# Patient Record
Sex: Female | Born: 1940
Health system: Southern US, Community
[De-identification: ages and names within clinical notes are randomized; demographics above are authoritative.]

## PROBLEM LIST (undated history)

## (undated) DIAGNOSIS — E7800 Pure hypercholesterolemia, unspecified: Secondary | ICD-10-CM

## (undated) DIAGNOSIS — N390 Urinary tract infection, site not specified: Secondary | ICD-10-CM

## (undated) DIAGNOSIS — K219 Gastro-esophageal reflux disease without esophagitis: Secondary | ICD-10-CM

## (undated) DIAGNOSIS — A048 Other specified bacterial intestinal infections: Secondary | ICD-10-CM

## (undated) HISTORY — PX: ABDOMINAL SURGERY: SHX537

## (undated) HISTORY — PX: ABDOMINAL HYSTERECTOMY: SHX81

---

## 1997-09-02 ENCOUNTER — Inpatient Hospital Stay (HOSPITAL_COMMUNITY): Admission: RE | Admit: 1997-09-02 | Discharge: 1997-09-04 | Payer: Self-pay | Admitting: Obstetrics and Gynecology

## 1999-02-15 ENCOUNTER — Observation Stay (HOSPITAL_COMMUNITY): Admission: EM | Admit: 1999-02-15 | Discharge: 1999-02-16 | Payer: Self-pay | Admitting: Emergency Medicine

## 1999-02-15 ENCOUNTER — Encounter: Payer: Self-pay | Admitting: *Deleted

## 1999-12-22 ENCOUNTER — Other Ambulatory Visit: Admission: RE | Admit: 1999-12-22 | Discharge: 1999-12-22 | Payer: Self-pay | Admitting: Obstetrics and Gynecology

## 2000-12-26 ENCOUNTER — Other Ambulatory Visit: Admission: RE | Admit: 2000-12-26 | Discharge: 2000-12-26 | Payer: Self-pay | Admitting: Obstetrics and Gynecology

## 2001-02-27 ENCOUNTER — Observation Stay (HOSPITAL_COMMUNITY): Admission: RE | Admit: 2001-02-27 | Discharge: 2001-02-28 | Payer: Self-pay | Admitting: Urology

## 2001-02-27 ENCOUNTER — Encounter (INDEPENDENT_AMBULATORY_CARE_PROVIDER_SITE_OTHER): Payer: Self-pay

## 2001-12-14 ENCOUNTER — Other Ambulatory Visit: Admission: RE | Admit: 2001-12-14 | Discharge: 2001-12-14 | Payer: Self-pay | Admitting: Internal Medicine

## 2002-05-24 ENCOUNTER — Encounter: Payer: Self-pay | Admitting: Obstetrics and Gynecology

## 2002-05-24 ENCOUNTER — Ambulatory Visit (HOSPITAL_COMMUNITY): Admission: RE | Admit: 2002-05-24 | Discharge: 2002-05-24 | Payer: Self-pay | Admitting: Obstetrics and Gynecology

## 2002-12-13 ENCOUNTER — Other Ambulatory Visit: Admission: RE | Admit: 2002-12-13 | Discharge: 2002-12-13 | Payer: Self-pay | Admitting: Obstetrics and Gynecology

## 2003-05-23 ENCOUNTER — Ambulatory Visit (HOSPITAL_COMMUNITY): Admission: RE | Admit: 2003-05-23 | Discharge: 2003-05-23 | Payer: Self-pay | Admitting: Obstetrics and Gynecology

## 2003-08-25 ENCOUNTER — Observation Stay (HOSPITAL_COMMUNITY): Admission: EM | Admit: 2003-08-25 | Discharge: 2003-08-26 | Payer: Self-pay

## 2004-01-22 ENCOUNTER — Other Ambulatory Visit: Admission: RE | Admit: 2004-01-22 | Discharge: 2004-01-22 | Payer: Self-pay | Admitting: Obstetrics and Gynecology

## 2005-03-25 ENCOUNTER — Other Ambulatory Visit: Admission: RE | Admit: 2005-03-25 | Discharge: 2005-03-25 | Payer: Self-pay | Admitting: Obstetrics and Gynecology

## 2005-07-08 ENCOUNTER — Ambulatory Visit: Payer: Self-pay | Admitting: Internal Medicine

## 2006-01-20 ENCOUNTER — Ambulatory Visit: Payer: Self-pay | Admitting: Internal Medicine

## 2006-05-25 ENCOUNTER — Emergency Department (HOSPITAL_COMMUNITY): Admission: EM | Admit: 2006-05-25 | Discharge: 2006-05-25 | Payer: Self-pay | Admitting: Emergency Medicine

## 2006-09-11 ENCOUNTER — Emergency Department (HOSPITAL_COMMUNITY): Admission: EM | Admit: 2006-09-11 | Discharge: 2006-09-11 | Payer: Self-pay | Admitting: Emergency Medicine

## 2006-09-27 ENCOUNTER — Inpatient Hospital Stay (HOSPITAL_COMMUNITY): Admission: EM | Admit: 2006-09-27 | Discharge: 2006-09-29 | Payer: Self-pay | Admitting: Emergency Medicine

## 2006-10-26 ENCOUNTER — Emergency Department (HOSPITAL_COMMUNITY): Admission: EM | Admit: 2006-10-26 | Discharge: 2006-10-27 | Payer: Self-pay | Admitting: Emergency Medicine

## 2007-04-20 ENCOUNTER — Ambulatory Visit (HOSPITAL_BASED_OUTPATIENT_CLINIC_OR_DEPARTMENT_OTHER): Admission: RE | Admit: 2007-04-20 | Discharge: 2007-04-20 | Payer: Self-pay | Admitting: Urology

## 2007-04-20 ENCOUNTER — Encounter (INDEPENDENT_AMBULATORY_CARE_PROVIDER_SITE_OTHER): Payer: Self-pay | Admitting: Urology

## 2007-10-10 ENCOUNTER — Emergency Department (HOSPITAL_COMMUNITY): Admission: EM | Admit: 2007-10-10 | Discharge: 2007-10-11 | Payer: Self-pay | Admitting: Emergency Medicine

## 2008-02-15 ENCOUNTER — Ambulatory Visit: Payer: Self-pay | Admitting: Internal Medicine

## 2008-02-15 DIAGNOSIS — R0602 Shortness of breath: Secondary | ICD-10-CM | POA: Insufficient documentation

## 2008-02-15 DIAGNOSIS — R079 Chest pain, unspecified: Secondary | ICD-10-CM | POA: Insufficient documentation

## 2008-02-15 DIAGNOSIS — J45909 Unspecified asthma, uncomplicated: Secondary | ICD-10-CM | POA: Insufficient documentation

## 2008-03-07 ENCOUNTER — Ambulatory Visit: Payer: Self-pay | Admitting: Cardiovascular Disease

## 2008-07-26 ENCOUNTER — Emergency Department (HOSPITAL_COMMUNITY): Admission: EM | Admit: 2008-07-26 | Discharge: 2008-07-26 | Payer: Self-pay | Admitting: Emergency Medicine

## 2009-02-13 ENCOUNTER — Emergency Department (HOSPITAL_COMMUNITY): Admission: EM | Admit: 2009-02-13 | Discharge: 2009-02-13 | Payer: Self-pay | Admitting: Family Medicine

## 2009-05-15 ENCOUNTER — Encounter (HOSPITAL_COMMUNITY): Admission: RE | Admit: 2009-05-15 | Discharge: 2009-08-13 | Payer: Self-pay | Admitting: Internal Medicine

## 2009-05-16 ENCOUNTER — Emergency Department (HOSPITAL_COMMUNITY): Admission: EM | Admit: 2009-05-16 | Discharge: 2009-05-16 | Payer: Self-pay | Admitting: Emergency Medicine

## 2009-08-19 ENCOUNTER — Emergency Department (HOSPITAL_COMMUNITY): Admission: EM | Admit: 2009-08-19 | Discharge: 2009-08-19 | Payer: Self-pay | Admitting: Emergency Medicine

## 2009-08-31 ENCOUNTER — Emergency Department (HOSPITAL_COMMUNITY): Admission: EM | Admit: 2009-08-31 | Discharge: 2009-08-31 | Payer: Self-pay | Admitting: Family Medicine

## 2010-02-19 ENCOUNTER — Emergency Department (HOSPITAL_COMMUNITY): Admission: EM | Admit: 2010-02-19 | Discharge: 2010-02-20 | Payer: Self-pay | Admitting: Emergency Medicine

## 2010-03-19 ENCOUNTER — Ambulatory Visit (HOSPITAL_BASED_OUTPATIENT_CLINIC_OR_DEPARTMENT_OTHER): Admission: RE | Admit: 2010-03-19 | Discharge: 2010-03-19 | Payer: Self-pay | Admitting: Urology

## 2010-07-26 ENCOUNTER — Encounter: Payer: Self-pay | Admitting: Internal Medicine

## 2010-08-12 ENCOUNTER — Ambulatory Visit (HOSPITAL_COMMUNITY)
Admission: RE | Admit: 2010-08-12 | Discharge: 2010-08-12 | Disposition: A | Discharge status: Home / Self Care | Payer: MEDICARE | Source: Ambulatory Visit | Attending: Chiropractic Medicine | Admitting: Chiropractic Medicine

## 2010-08-12 ENCOUNTER — Other Ambulatory Visit (HOSPITAL_COMMUNITY): Payer: Self-pay | Admitting: Chiropractic Medicine

## 2010-08-12 DIAGNOSIS — M79609 Pain in unspecified limb: Secondary | ICD-10-CM | POA: Insufficient documentation

## 2010-08-12 DIAGNOSIS — R52 Pain, unspecified: Secondary | ICD-10-CM

## 2010-09-16 LAB — URINE CULTURE
Colony Count: >=100000
Culture  Setup Time: 201108201134

## 2010-09-16 LAB — CBC
HCT: 31.7 % — ABNORMAL LOW (ref 36.0–46.0)
Hemoglobin: 10.3 g/dL — ABNORMAL LOW (ref 12.0–15.0)
MCH: 26.1 pg (ref 26.0–34.0)
MCHC: 32.5 g/dL (ref 30.0–36.0)
MCV: 80.5 fL (ref 78.0–100.0)
Platelets: 223 10*3/uL (ref 150–400)
RBC: 3.94 MIL/uL (ref 3.87–5.11)
RDW: 14.2 % (ref 11.5–15.5)
WBC: 10.7 10*3/uL — ABNORMAL HIGH (ref 4.0–10.5)

## 2010-09-16 LAB — COMPREHENSIVE METABOLIC PANEL
ALT: 18 U/L (ref 0–35)
AST: 17 U/L (ref 0–37)
Albumin: 3.3 g/dL — ABNORMAL LOW (ref 3.5–5.2)
Alkaline Phosphatase: 47 U/L (ref 39–117)
BUN: 9 mg/dL (ref 6–23)
CO2: 28 mEq/L (ref 19–32)
Calcium: 8.5 mg/dL (ref 8.4–10.5)
Chloride: 104 mEq/L (ref 96–112)
Creatinine, Ser: 0.6 mg/dL (ref 0.4–1.2)
GFR calc Af Amer: >60 mL/min (ref >60)
GFR calc non Af Amer: >60 mL/min (ref >60)
Glucose, Bld: 96 mg/dL (ref 70–99)
Potassium: 4.1 mEq/L (ref 3.5–5.1)
Sodium: 135 mEq/L (ref 135–145)
Total Bilirubin: 0.6 mg/dL (ref 0.3–1.2)
Total Protein: 6.8 g/dL (ref 6.0–8.3)

## 2010-09-16 LAB — URINALYSIS, ROUTINE W REFLEX MICROSCOPIC
Bilirubin Urine: NEGATIVE
Glucose, UA: NEGATIVE mg/dL
Ketones, ur: NEGATIVE mg/dL
Nitrite: NEGATIVE
Protein, ur: NEGATIVE mg/dL
Specific Gravity, Urine: 1.004 — ABNORMAL LOW (ref 1.005–1.030)
Urobilinogen, UA: 0.2 mg/dL (ref 0.0–1.0)
pH: 7.5 (ref 5.0–8.0)

## 2010-09-16 LAB — URINE MICROSCOPIC-ADD ON

## 2010-09-16 LAB — DIFFERENTIAL
Basophils Absolute: 0.1 10*3/uL (ref 0.0–0.1)
Basophils Relative: 1 % (ref 0–1)
Eosinophils Absolute: 1 10*3/uL — ABNORMAL HIGH (ref 0.0–0.7)
Eosinophils Relative: 9 % — ABNORMAL HIGH (ref 0–5)
Lymphocytes Relative: 26 % (ref 12–46)
Lymphs Abs: 2.7 10*3/uL (ref 0.7–4.0)
Monocytes Absolute: 0.6 10*3/uL (ref 0.1–1.0)
Monocytes Relative: 6 % (ref 3–12)
Neutro Abs: 6.2 10*3/uL (ref 1.7–7.7)
Neutrophils Relative %: 58 % (ref 43–77)

## 2010-09-16 LAB — POCT HEMOGLOBIN-HEMACUE: Hemoglobin: 11.3 g/dL — ABNORMAL LOW (ref 12.0–15.0)

## 2010-09-22 LAB — URINALYSIS, ROUTINE W REFLEX MICROSCOPIC
Bilirubin Urine: NEGATIVE
Glucose, UA: NEGATIVE mg/dL
Ketones, ur: NEGATIVE mg/dL
Nitrite: NEGATIVE
Protein, ur: 100 mg/dL — AB
Specific Gravity, Urine: 1.017 (ref 1.005–1.030)
Urobilinogen, UA: 0.2 mg/dL (ref 0.0–1.0)
pH: 6 (ref 5.0–8.0)

## 2010-09-22 LAB — URINE CULTURE
Colony Count: NO GROWTH
Culture: NO GROWTH

## 2010-09-22 LAB — POCT I-STAT, CHEM 8
BUN: 10 mg/dL (ref 6–23)
Calcium, Ion: 1.06 mmol/L — ABNORMAL LOW (ref 1.12–1.32)
Chloride: 96 mEq/L (ref 96–112)
Creatinine, Ser: 0.5 mg/dL (ref 0.4–1.2)
Glucose, Bld: 83 mg/dL (ref 70–99)
HCT: 37 % (ref 36.0–46.0)
Hemoglobin: 12.6 g/dL (ref 12.0–15.0)
Potassium: 4.1 mEq/L (ref 3.5–5.1)
Sodium: 131 mEq/L — ABNORMAL LOW (ref 135–145)
TCO2: 32 mmol/L (ref 0–100)

## 2010-09-22 LAB — PREGNANCY, URINE: Preg Test, Ur: NEGATIVE

## 2010-09-22 LAB — POCT URINALYSIS DIP (DEVICE)
Bilirubin Urine: NEGATIVE
Glucose, UA: NEGATIVE mg/dL
Ketones, ur: NEGATIVE mg/dL
Nitrite: NEGATIVE
Protein, ur: 100 mg/dL — AB
Specific Gravity, Urine: 1.01 (ref 1.005–1.030)
Urobilinogen, UA: 0.2 mg/dL (ref 0.0–1.0)
pH: 7 (ref 5.0–8.0)

## 2010-09-22 LAB — URINE MICROSCOPIC-ADD ON

## 2010-10-06 LAB — POCT CARDIAC MARKERS
CKMB, poc: <1 ng/mL — ABNORMAL LOW (ref 1.0–8.0)
Myoglobin, poc: 46.1 ng/mL (ref 12–200)
Troponin i, poc: <0.05 ng/mL (ref 0.00–0.09)

## 2010-10-06 LAB — DIFFERENTIAL
Basophils Relative: 0 % (ref 0–1)
Lymphocytes Relative: 5 % — ABNORMAL LOW (ref 12–46)
Monocytes Absolute: 0.1 10*3/uL (ref 0.1–1.0)
Monocytes Relative: 1 % — ABNORMAL LOW (ref 3–12)
Neutro Abs: 9.6 10*3/uL — ABNORMAL HIGH (ref 1.7–7.7)
Neutrophils Relative %: 90 % — ABNORMAL HIGH (ref 43–77)

## 2010-10-06 LAB — CBC
Hemoglobin: 11.3 g/dL — ABNORMAL LOW (ref 12.0–15.0)
MCHC: 33.8 g/dL (ref 30.0–36.0)
RBC: 4.08 MIL/uL (ref 3.87–5.11)
WBC: 10.7 10*3/uL — ABNORMAL HIGH (ref 4.0–10.5)

## 2010-10-06 LAB — POCT I-STAT, CHEM 8
Chloride: 98 mEq/L (ref 96–112)
Creatinine, Ser: 0.5 mg/dL (ref 0.4–1.2)
HCT: 36 % (ref 36.0–46.0)
Hemoglobin: 12.2 g/dL (ref 12.0–15.0)
Potassium: 3.4 mEq/L — ABNORMAL LOW (ref 3.5–5.1)
Sodium: 136 mEq/L (ref 135–145)

## 2010-10-18 LAB — POCT I-STAT, CHEM 8
Creatinine, Ser: 0.5 mg/dL (ref 0.4–1.2)
Glucose, Bld: 133 mg/dL — ABNORMAL HIGH (ref 70–99)
HCT: 40 % (ref 36.0–46.0)
Hemoglobin: 13.6 g/dL (ref 12.0–15.0)
Sodium: 139 mEq/L (ref 135–145)
TCO2: 29 mmol/L (ref 0–100)

## 2010-11-04 ENCOUNTER — Other Ambulatory Visit: Payer: Self-pay | Admitting: Orthopedic Surgery

## 2010-11-04 DIAGNOSIS — M542 Cervicalgia: Secondary | ICD-10-CM

## 2010-11-08 ENCOUNTER — Ambulatory Visit
Admission: RE | Admit: 2010-11-08 | Discharge: 2010-11-08 | Disposition: A | Discharge status: Home / Self Care | Payer: MEDICARE | Source: Ambulatory Visit | Attending: Orthopedic Surgery | Admitting: Orthopedic Surgery

## 2010-11-08 DIAGNOSIS — M542 Cervicalgia: Secondary | ICD-10-CM

## 2010-11-16 NOTE — Op Note (Signed)
Samantha [name], Samantha [name]                    ACCOUNT NO.:  [id number]   MEDICAL RECORD NO.:  [id number]          PATIENT TYPE:  AMB   LOCATION:  NESC                         FACILITY:  [facility]   PHYSICIAN:  Sigmund I. Patsi Sears, M.D.DATE OF BIRTH:  1941-07-04   DATE OF PROCEDURE:  04/20/2007  DATE OF DISCHARGE:  04/20/2007                               OPERATIVE REPORT   PREOPERATIVE DIAGNOSIS:  Interstitial cystitis.   POSTOPERATIVE DIAGNOSIS:  Interstitial cystitis.   OPERATION:  Cystourethroscopy, hydrodistention of the bladder, bladder  biopsy, insertion of Marcaine/Pyridium in the bladder, injection of  Marcaine/Kenalog into the subtrigonal space.   SURGEON:  Sigmund I. Patsi Sears, M.D.   ANESTHESIA:  General, LMA.   PREPARATION:  After appropriate preanesthesia the patient was brought to  the operating room and placed on the operating table in dorsal supine  position where general LMA anesthesia was induced.  She was then  replaced in the dorsal lithotomy position where the pubis was prepped  with Betadine solution and draped in the usual fashion.   REVIEWED HISTORY:  This 70 year old [place] female has been  evaluated, complaining of dyspareunia and recurrent urinary tract  infections.  She was originally seen in 2002 by Dr. Annabell Howells, with a  history of cysto, HOD for IC at that time.  The patient's bladder  distended to 1200 mL and urethra was dilated to 34 [place].  Mild  glomerular lesions were identified in the bladder wall at that time.  She is now for repeat HOD.   PROCEDURE IN DETAIL:  Cystourethroscopy was accomplished, it shows a  normal appearing bladder which holds approximately 900 mL.  Following  this, repeat cystoscopy was accomplished and mild glomerulations were  noted.  Cold cup bladder biopsies were taken and each biopsy site is  then cauterized.  Marcaine/Pyridium is inserted in the bladder, and  Marcaine/Kenalog is injected into the bladder base.  B and O  suppository  was given at the beginning of the procedure.  The patient was awakened  and taken to the recovery room in good condition.      Sigmund I. Patsi Sears, M.D.  Electronically Signed     SIT/MEDQ  D:  04/20/2007  T:  04/22/2007  Job:  [id number]

## 2010-11-19 NOTE — Op Note (Signed)
[facility]  Patient:    HERMENA, SWINT Visit Number: [id number] MRN: [id number]          Service Type: SUR Location: 4W [id number] [id number] Attending Physician:  Evlyn Clines Proc. Date: 02/27/01 Adm. Date:  02/27/2001                             Operative Report  INCOMPLETE DICTATION  PROCEDURE:  Cystoscopy, hydrodistention of bladder, urethral dilation, installation of Pyridium and Marcaine.  PREOPERATIVE DIAGNOSES:  Painful bladder.  POSTOPERATIVE DIAGNOSES:  Painful bladder.  SURGEON:  Excell Seltzer. Annabell Howells, M.D.  ANESTHESIA:  General.  COMPLICATIONS:  None.  INDICATIONS FOR PROCEDURE:  Ms. Dobesh is a 70 year old ______ female who was sent by Dr. Mancel Bale for irritative voiding symptoms, question it sounds like she might have ... Attending Physician:  Evlyn Clines DD:  02/27/01 TD:  02/27/01 Job: [id number]

## 2010-11-19 NOTE — H&P (Signed)
[facility]  Patient:    Samantha [name], Samantha [name] Visit Number: [id number] MRN: [id number]          Service Type: Attending:  Duke Salvia. Marcelle Overlie, M.D. Dictated by:   Duke Salvia. Marcelle Overlie, M.D. Adm. Date:  02/27/01                           History and Physical  CHIEF COMPLAINT:  Chronic pelvic pain.  HISTORY OF PRESENT ILLNESS:  The patient is a 70 year old Asian female who underwent TBH in 1999 with conservation of her ovaries at the time, no ovarian abnormalities were noted at that time.  Since then, she has been bothered by back and deep pelvic pain with negative ultrasound examinations, but due to continued problems with pain, now presents for a laparoscopy with definitive bilateral salpingo-oophorectomy.  This procedure was explained to her through her interpretor.  The procedure of laparoscopy with laparoscopic removal of the ovaries, the possible need for open or additional surgery discussed. Risks relative to bleeding, infection, transfusion, adjacent organ injury explained also.  In the meantime, she has had evaluation for some bladder symptoms by Dr. Bjorn Pippin, who will perform cystoscopy and hydrodistention of the bladder with urethral dilation, Peridium, and Marcaine for possible ______.  This will be done at the same time.  ALLERGIES:  No known drug allergies.  PAST SURGICAL HISTORY:  TBH.  OBSTETRICAL HISTORY:  Two vaginal deliveries at term.  CURRENT MEDICATIONS:  ______ 1 mg q.d.  PHYSICAL EXAMINATION:  VITAL SIGNS:  Temperature 98.2, blood pressure 120/70.  HEENT:  Unremarkable.  NECK:  Supple without masses.  LUNGS:  Clear.  CARDIOVASCULAR:  Regular rate and rhythm without murmurs, rubs, or gallops.  BREASTS:  Without masses.  ABDOMEN:  Soft, flat, nontender.  PELVIC:  Normal external genitalia.  Vaginal cuff clear.  Bimanual revealed tenderness, but no masses or nodularity noted.  EXTREMITIES:  Unremarkable.  NEUROLOGIC:   Unremarkable.  IMPRESSION: 1. Chronic pelvic pain, possible ovarian adhesions. 2. Chronic bladder symptoms.  PLAN:  Cystoscopy per Dr. Annabell Howells, followed by diagnostic laparoscopy with laparoscopic or open bilateral salpingo-oophorectomy.  Procedure and risks discussed as above. Dictated by:   Duke Salvia. Marcelle Overlie, M.D. Attending:  Duke Salvia. Marcelle Overlie, M.D. DD:  02/25/01 TD:  02/26/01 Job: [id number]

## 2010-11-19 NOTE — Op Note (Signed)
Samantha [name], Samantha [name] NO.:  [id number]   MEDICAL RECORD NO.:  [id number]                   PATIENT TYPE:  INP   LOCATION:  5714                                 FACILITY:  [facility]   PHYSICIAN:  Artist Pais. Mina Marble, M.D.           DATE OF BIRTH:  [birth year]   DATE OF PROCEDURE:  08/25/2003  DATE OF DISCHARGE:  08/26/2003                                 OPERATIVE REPORT   PREOPERATIVE DIAGNOSES:  Left hand crush injury with degloving injury, with  PIP dislocation left 5th digit.  Possible tendon, artery and nerve damage.   POSTOPERATIVE DIAGNOSES:  Left hand crush injury with degloving injury, with  proximal interphalangeal dislocation left 5th digit.  Possible tendon,  artery and  nerve damage.   PROCEDURE:  Irrigation and debridement, microscopic repair of the common  digital nerve to index and long, and closed treatment of the proximal  interphalangeal dislocation.   SURGEON:  Artist Pais. Mina Marble, M.D.   ASSISTANT:  None.   ANESTHESIA:  General.   TOURNIQUET TIME:  45 minutes.   COMPLICATIONS:  None.   DESCRIPTION OF PROCEDURE:  The patient was taken to the operating room.  After the induction of adequate general anesthesia, the left upper extremity  was prepped and draped in the usual sterile fashion.  Esmarch was used to  exsanguinate the limb. Tourniquet was inflated to 250 mmHg.  At this point  in time, the left hand was explored. There was a complex laceration palmarly  from the MP joint of the index finger to the MP joint of the small finger,  volarly, in an oblique fashion.   Exploration revealed an avulsion type injury to the common digital nerve to  the index and long.  This was repaired under microscopic magnification using  9-0 nylon.  After this was done, a closed PIP dislocation on the 5th digit  was reduced.  The wounds were thoroughly irrigated. They were loosely closed  with 5-0 nylon.  Patient was placed in a sterile dressing,  Xeroform, 4 x  4's, Fluffs and a protective splint.  The patient tolerated the procedure  well, went to the recovery room in a stable fashion.                                               Artist Pais Mina Marble, M.D.    MAW/MEDQ  D:  10/09/2003  T:  10/09/2003  Job:  [id number]

## 2010-11-19 NOTE — Assessment & Plan Note (Signed)
[facility]                           ELECTROPHYSIOLOGY OFFICE NOTE   NAME:Happel, H-DEN                            MRN:          [id number]  DATE:01/20/2006                            DOB:          1940/12/24    Ms. Rippetoe returns today for followup.  Her history is provided by her son who  is fluent in [place].  She is a pleasant 70 year old [place] woman with a  history of palpitations and documented SVT which I do not have records of  today but my prior notes suggested that she had a long RP tachycardia.  This  was at rate of approximately 150 beats per minute.  She also on cardiac  monitoring had fairly frequent PACs and PVCs.  She returns today for  followup and states that she has had rare heart racing since being started  on beta-blockers but does note some fatigue and tiredness on these drugs.  She also complains of leg pain which proceeds her initiation of Crestor.  She denies shortness of breath and otherwise feels well except for fatigue  on her beta-blocker.  She has had no syncope.   PHYSICAL EXAMINATION:  GENERAL:  On exam, she is a pleasant middle-aged  woman in no distress.  VITAL SIGNS:  Blood pressure was 104/68, the pulse 51 and regular,  respirations are 18.  The weight was 129 pounds.  NECK:  Revealed no jugular venous distention.  LUNGS:  Clear bilaterally to auscultation.  CARDIOVASCULAR:  Revealed a regular rate and rhythm with normal S1 and S2.  EXTREMITIES:  Demonstrated no edema.   EKG today demonstrates a sinus bradycardia at 51 beats per minute.  There  are no ST-T wave abnormalities.   IMPRESSION:  1.  Symptomatic palpitations.  2.  Documented nonsustained supraventricular tachycardia.  3.  Frequent premature ventricular contractions.  4.  Sinus bradycardia.   DISCUSSION:  Ms. Nickle is stable at present.  While she does continue to have  some arrhythmia, it is not particularly severe.  Today, because of her  fatigue  on a twice daily beta-blocker, I have asked her to take 1 tablet at  night and none during the day unless she has breakthrough  palpitations and SVT.  We will plan to see her back in a year, sooner should  she have symptomatic recurrence.                                  Doylene Canning. Ladona Ridgel, MD  GWT/MedQ  DD:  01/20/2006  DT:  01/20/2006  Job #:  [id number]   cc:   Cristy Hilts. Jacinto Halim, MD

## 2010-11-19 NOTE — Consult Note (Signed)
[name], [name] NO.:  [id number]   MEDICAL RECORD NO.:  [id number]                   PATIENT TYPE:  INP   LOCATION:  5714                                 FACILITY:  [facility]   PHYSICIAN:  Artist Pais. Mina Marble, M.D.           DATE OF BIRTH:  [birth year]   DATE OF CONSULTATION:  08/25/2003  DATE OF DISCHARGE:                                   CONSULTATION   REQUESTING PHYSICIAN:  Pearletha Alfred, M.D.   REASON FOR CONSULTATION:  Samantha [name] is a very pleasant right hand dominant  female who injured her left hand while at work today at __________ in  [place], [place], and was transferred here with an acute injury to  her nondominant left hand. She is an otherwise fairly healthy 70 year old  female, right hand dominant, with no known drug allergies. She is currently  taking Nexium. She had a hysterectomy in the year 2000. No recent  hospitalizations or surgery.   FAMILY HISTORY:  Noncontributory.   SOCIAL HISTORY:  Noncontributory.   PHYSICAL EXAMINATION:  Examination reveals an obvious deformity of the fifth  digit with what appears to be a dislocation of the fifth PIP joint. She also  has complex lacerations across the distal palmar crease and mid palmar  crease with loss of sensation to the long and ring finger distally and  weakness in flexion. X-rays show PIP dislocation on the fifth digit.   IMPRESSION:  A 70 year old female with an open injury to her hand with PIP  dislocation and possible tendon, artery, and nerve damage with complex  lacerations palmarly.   RECOMMENDATIONS:  Recommendation at this point in time is to take her to the  operating room for exploration and repair as necessary of traumatic injury.                                               Artist Pais Mina Marble, M.D.    MAW/MEDQ  D:  08/25/2003  T:  08/26/2003  Job:  [id number]

## 2010-11-19 NOTE — Op Note (Signed)
[facility]  Patient:    Samantha [name], Samantha [name] Visit Number: [id number] MRN: [id number]          Service Type: Attending:  Excell Seltzer. Annabell Howells, M.D. Dictated by:   Excell Seltzer. Annabell Howells, M.D. Proc. Date: 02/27/01                             Operative Report  OPERATION:  Cystoscopy, hydrodistention of the bladder, urethral dilation and instillation of Pyridium and Marcaine.  PREOPERATIVE DIAGNOSIS:  Painful bladder.  POSTOPERATIVE DIAGNOSIS:  Painful bladder.  SURGEON:  Excell Seltzer. Annabell Howells, M.D.  ANESTHESIA:  General  COMPLICATIONS:  None.  INDICATIONS:  Mrs. Burnstein is a 70 year old female who was sent to see me by Dr. Marcelle Overlie for frequency and bladder discomfort.  Her symptoms were suggestive of interstitial cystitis. It was felt that cystoscopy and hydrodistention of the bladder were indicated.  She is to undergo laparoscopy by Dr. Marcelle Overlie to rule out pelvic scarring and problems of the left ovary.  FINDINGS AND PROCEDURE: She was given p.o. Tequin and taken to the operating room where general anesthetic was induced.  She was placed in the lithotomy position.  Perineum and genitalia were prepped Betadine solution.  She was draped in the usual sterile fashion.  Cystoscopy was performed using a 22 [place] scope and the 12 and 70 degrees lens.  This examination revealed a normal urethra.  The bladder wall had mild trabeculation.  No tumors, stones or inflammation were noted.  Ureteral orifices were in their normal anatomic position, effluxing clear urine.  The cystoscope was removed and an 58 French Foley catheter was inserted.  The bladder was then filled under 80 to 100 cm of water pressure to capacity. This was held for five minutes.  The bladder was then drained.  Her capacity under anesthesia was 1200 cc.  There was no blood in the terminal efflux.  Reinspection of the bladder after hydrodistention revealed mild evidence of glomerulations on the bladder walls but no  significant cracking or bleeding.  The urethra was then calibrated with female sounds up to 35 [place].  She was a bit tight at 30. She then underwent replacement of the Foley catheter.  The bladder was drained. The bladder was then instilled with 30 cc of 0.25% Marcaine with 400 cc of crushed Pyridium and the catheter was then removed.  At this point, Dr. Marcelle Overlie took over to perform the laparoscopy. There were no complications during my portion of the procedure. Dictated by:   Excell Seltzer. Annabell Howells, M.D. Attending:  Excell Seltzer. Annabell Howells, M.D. DD:  02/27/01 TD:  02/27/01 Job: [id number]

## 2010-11-19 NOTE — Cardiovascular Report (Signed)
Samantha [name], Samantha [name]                    ACCOUNT NO.:  [id number]   MEDICAL RECORD NO.:  [id number]          PATIENT TYPE:  INP   LOCATION:  6527                         FACILITY:  [facility]   PHYSICIAN:  Darlin Priestly, MD  DATE OF BIRTH:  Oct 30, 1940   DATE OF PROCEDURE:  09/27/2006  DATE OF DISCHARGE:                            CARDIAC CATHETERIZATION   PROCEDURE:  1. Left heart catheterization.  2. Coronary angiography.  3. Left ventriculogram.   COMPLICATIONS:  None.   INDICATIONS FOR PROCEDURE:  This patient is a 70 year old female who is  a patient of Dr. Yates Decamp with a history of intermittent palpitations  and SVT.  She was admitted to the ER on the evening of September 26, 2006,  with exceptional chest pain without significant EKG changes.  She  subsequently ruled out for MI.  She is now brought for a cardiac  catheterization to rule out significant  CAD.   DESCRIPTION OF PROCEDURE:  After obtaining informed consent, the patient  was brought to the cardiac cath lab.  She was prepped and draped in the  usual sterile fashion.  ECG monitoring was established.  Using the  modified Seldinger technique, a 6-French arterial sheath was inserted  into the right femoral artery.  A 6-French diagnostic catheter was used  to perform diagnostic angiography.   Left main and the large vessel with no significant disease.   LAD is a medium- to large-sized vessel, which course gives rise to two  diagonal branches.  The LAD is coarsely irregular, but has no high-grade  stenosis.   The first diagonal is a medium-sized vessel, which bifurcates distally,  with no significant disease.   Second diagonal is a small vessel with no significant disease.   The circumflex is a medium-sized vessel which courses the AV groove and  gives rise to two obtuse marginal branches.  The AV groove circumflex  has no significant disease.   The first OM is a medium- to large-sized vessel which bifurcates inside  the midsegment.  There is mild 30% narrowing in the proximal portion of  the lower bifurcation.   The second OM is a medium-sized vessel with no significant disease.   The right coronary artery is a large vessel that is dominant that gives  rise to both PDA as well as  posterolateral branch.  There is mild 50%  early mid narrowing, but no further high-grade stenosis.  The PDA and  posterolateral branch have no significant disease.   Left ventriculogram has observed EF of 60%.   Hemodynamic system gradient pressure 101/54, LV pressure is 102/60 and  elevated P of 14.   CONCLUSION:  1. No significant coronary artery disease.  2. Normal left ventricular systolic function.      Darlin Priestly, MD  Electronically Signed     RHM/MEDQ  D:  09/27/2006  T:  09/27/2006  Job:  [id number]   cc:   Cristy Hilts. Jacinto Halim, MD

## 2010-11-19 NOTE — Discharge Summary (Signed)
Samantha [name], Samantha [name]                    ACCOUNT NO.:  [id number]   MEDICAL RECORD NO.:  [id number]          PATIENT TYPE:  INP   LOCATION:  6527                         FACILITY:  [facility]   PHYSICIAN:  Cristy Hilts. Jacinto Halim, MD       DATE OF BIRTH:  07-01-1941   DATE OF ADMISSION:  09/26/2006  DATE OF DISCHARGE:  09/29/2006                               DISCHARGE SUMMARY   DISCHARGE DIAGNOSES:  1. Chest pain - ruled out for myocardial infarction during this      admission, status post catheterization by Dr. Jenne Campus on September 27, 2006 - noncritical coronary artery disease, medical therapy.  2. History of paroxysmal supraventricular tachycardia/atrial flutter      with 2:1 conduction block.  3. Mild sinus bradycardia - medications adjusted.   HPI/HOSPITAL COURSE:  This is a 70 year old [place] female who  presented to the [facility] with complaints of chest pain  radiating up to her neck and to the back.  The patient tried alleviating  her symptoms with nitroglycerin sublingual x2, but it did not  significantly help.  She was seen by Dr. Allyson Sabal on admission and was  taken into the coronary catheterization lab for a coronary angiography.   HOSPITAL PROCEDURES:  Patient underwent coronary angiography on September 27, 2006 by Dr. Jenne Campus.  She has an allergy to CONTRAST DYE and  contrast prophylaxis was instituted.  The catheterization revealed  scattered noncritical coronary artery disease with proximal RCA 50%  stenosis.  Obtuse marginal one had 30% stenosis and some irregularities  were noted in the distal LAD.  EF was around 60%.  Decision was to  proceed with medical therapy.   The next morning, patient was evaluated by Dr. Clarene Duke and was noticed to  be in mild bradycardia, so Lopressor was discontinued because of this  bradycardia and patient's complaint of fatigue and Cardizem CD was  initiated.   HOSPITAL LABORATORIES:  Sodium 140, potassium 3.7, chloride 103, CO2 30,  BUN 9,  creatinine 0.46, glucose 93.  TSH 0.906.  Hemoglobin 11.4,  hematocrit 34.5, white blood cell count 11,000 and platelet count  279,000.  D-dimer was within normal limits.   DISCHARGE MEDICATIONS:  1. Imdur 30 mg daily.  2. Aspirin 81 mg daily.  3. Cardizem 120 mg daily.  4. Zocor 40 mg daily.  5. Aciphex 20 mg daily.   DISCHARGE DIET:  Low-salt, low-fat, low-cholesterol diet.   DISCHARGE ACTIVITIES:  She was allowed to increase activities slowly.  She was allowed to shower.  Instructed not to drive or lift weights for  3 days post discharge.   FOLLOWUP:  Dr. Jacinto Halim will see patient on October 11, 2006 at 12:15 in our  office.      Raymon Mutton, P.A.      Cristy Hilts. Jacinto Halim, MD  Electronically Signed    MK/MEDQ  D:  09/29/2006  T:  09/29/2006  Job:  [id number]

## 2010-12-15 ENCOUNTER — Other Ambulatory Visit: Payer: Self-pay | Admitting: Internal Medicine

## 2010-12-15 DIAGNOSIS — M899 Disorder of bone, unspecified: Secondary | ICD-10-CM

## 2010-12-17 ENCOUNTER — Other Ambulatory Visit: Payer: Medicare Other

## 2010-12-17 ENCOUNTER — Ambulatory Visit
Admission: RE | Admit: 2010-12-17 | Discharge: 2010-12-17 | Disposition: A | Discharge status: Home / Self Care | Payer: Medicare Other | Source: Ambulatory Visit | Attending: Internal Medicine | Admitting: Internal Medicine

## 2010-12-17 DIAGNOSIS — M899 Disorder of bone, unspecified: Secondary | ICD-10-CM

## 2010-12-17 MED ORDER — IOHEXOL 300 MG/ML  SOLN
100.0000 mL | Freq: Once | INTRAMUSCULAR | Status: AC | PRN
  Administered 2010-12-17: 100 mL via INTRAVENOUS

## 2011-03-29 LAB — URINALYSIS, ROUTINE W REFLEX MICROSCOPIC
Bilirubin Urine: NEGATIVE
Protein, ur: NEGATIVE
Urobilinogen, UA: 0.2

## 2011-08-04 ENCOUNTER — Other Ambulatory Visit: Payer: Self-pay | Admitting: Nurse Practitioner

## 2011-08-04 ENCOUNTER — Ambulatory Visit
Admission: RE | Admit: 2011-08-04 | Discharge: 2011-08-04 | Disposition: A | Discharge status: Home / Self Care | Payer: Medicare Other | Source: Ambulatory Visit | Attending: Nurse Practitioner | Admitting: Nurse Practitioner

## 2011-08-04 DIAGNOSIS — M79671 Pain in right foot: Secondary | ICD-10-CM

## 2011-09-30 ENCOUNTER — Emergency Department (HOSPITAL_COMMUNITY)
Admission: EM | Admit: 2011-09-30 | Discharge: 2011-09-30 | Disposition: A | Discharge status: Home / Self Care | Payer: Medicare Other | Attending: Emergency Medicine | Admitting: Emergency Medicine

## 2011-09-30 ENCOUNTER — Emergency Department (HOSPITAL_COMMUNITY): Payer: Medicare Other

## 2011-09-30 ENCOUNTER — Encounter (HOSPITAL_COMMUNITY): Payer: Self-pay | Admitting: Emergency Medicine

## 2011-09-30 DIAGNOSIS — J45909 Unspecified asthma, uncomplicated: Secondary | ICD-10-CM | POA: Insufficient documentation

## 2011-09-30 DIAGNOSIS — R319 Hematuria, unspecified: Secondary | ICD-10-CM | POA: Insufficient documentation

## 2011-09-30 DIAGNOSIS — K5730 Diverticulosis of large intestine without perforation or abscess without bleeding: Secondary | ICD-10-CM | POA: Insufficient documentation

## 2011-09-30 DIAGNOSIS — N3090 Cystitis, unspecified without hematuria: Secondary | ICD-10-CM | POA: Insufficient documentation

## 2011-09-30 DIAGNOSIS — R197 Diarrhea, unspecified: Secondary | ICD-10-CM | POA: Insufficient documentation

## 2011-09-30 DIAGNOSIS — K219 Gastro-esophageal reflux disease without esophagitis: Secondary | ICD-10-CM | POA: Insufficient documentation

## 2011-09-30 HISTORY — DX: Gastro-esophageal reflux disease without esophagitis: K21.9

## 2011-09-30 HISTORY — DX: Urinary tract infection, site not specified: N39.0

## 2011-09-30 LAB — CBC
HCT: 37.4 % (ref 36.0–46.0)
Hemoglobin: 12.4 g/dL (ref 12.0–15.0)
MCV: 81.5 fL (ref 78.0–100.0)
RBC: 4.59 MIL/uL (ref 3.87–5.11)
RDW: 13.9 % (ref 11.5–15.5)
WBC: 7.5 10*3/uL (ref 4.0–10.5)

## 2011-09-30 LAB — URINALYSIS, ROUTINE W REFLEX MICROSCOPIC
Bilirubin Urine: NEGATIVE
Glucose, UA: NEGATIVE mg/dL
Ketones, ur: NEGATIVE mg/dL
Nitrite: NEGATIVE
Protein, ur: 100 mg/dL — AB
Specific Gravity, Urine: 1.003 — ABNORMAL LOW (ref 1.005–1.030)
Urobilinogen, UA: 0.2 mg/dL (ref 0.0–1.0)
pH: 7 (ref 5.0–8.0)

## 2011-09-30 LAB — BASIC METABOLIC PANEL WITH GFR
BUN: 13 mg/dL (ref 6–23)
CO2: 27 meq/L (ref 19–32)
Calcium: 9 mg/dL (ref 8.4–10.5)
Chloride: 101 meq/L (ref 96–112)
Creatinine, Ser: 0.51 mg/dL (ref 0.50–1.10)
GFR calc Af Amer: >90 mL/min
GFR calc non Af Amer: >90 mL/min
Glucose, Bld: 104 mg/dL — ABNORMAL HIGH (ref 70–99)
Potassium: 3.7 meq/L (ref 3.5–5.1)
Sodium: 136 meq/L (ref 135–145)

## 2011-09-30 LAB — DIFFERENTIAL
Basophils Absolute: 0 K/uL (ref 0.0–0.1)
Basophils Relative: 1 % (ref 0–1)
Eosinophils Absolute: 0.3 K/uL (ref 0.0–0.7)
Eosinophils Relative: 4 % (ref 0–5)
Lymphocytes Relative: 35 % (ref 12–46)
Lymphs Abs: 2.6 K/uL (ref 0.7–4.0)
Monocytes Absolute: 0.3 K/uL (ref 0.1–1.0)
Monocytes Relative: 4 % (ref 3–12)
Neutro Abs: 4.3 K/uL (ref 1.7–7.7)
Neutrophils Relative %: 57 % (ref 43–77)

## 2011-09-30 LAB — URINE MICROSCOPIC-ADD ON

## 2011-09-30 MED ORDER — LIDOCAINE HCL (PF) 1 % IJ SOLN
INTRAMUSCULAR | Status: AC
  Administered 2011-09-30: 06:00:00
  Filled 2011-09-30: qty 5

## 2011-09-30 MED ORDER — TRAMADOL HCL 50 MG PO TABS
50.0000 mg | ORAL_TABLET | Freq: Once | ORAL | Status: AC
  Administered 2011-09-30: 50 mg via ORAL
  Filled 2011-09-30: qty 1

## 2011-09-30 MED ORDER — CIPROFLOXACIN HCL 500 MG PO TABS: 500.0000 mg | ORAL_TABLET | Freq: Two times a day (BID) | ORAL | Status: AC

## 2011-09-30 MED ORDER — CEFTRIAXONE SODIUM 1 G IJ SOLR
1.0000 g | Freq: Once | INTRAMUSCULAR | Status: AC
  Administered 2011-09-30: 1 g via INTRAMUSCULAR
  Filled 2011-09-30: qty 10

## 2011-09-30 NOTE — ED Notes (Signed)
PT. REPORTS HEMATURIA WITH DYSURIA AND VOMITTING  /DIARRHEA ONSET LAST NIGHT. SLIGHT CHILLS.

## 2011-09-30 NOTE — ED Notes (Signed)
Patient presents stating that she had some blood in her urine.  This is not the first time that this has happened with her.

## 2011-09-30 NOTE — ED Notes (Signed)
Patient stated that she is still hurting but not as bad.  Assisted up to the bathroom

## 2011-09-30 NOTE — ED Provider Notes (Signed)
History     CSN: [id number]  Arrival date & time 09/30/11  1308   First MD Initiated Contact with Patient 09/30/11 [phone]      Chief Complaint  Patient presents with  . Hematuria    (Consider location/radiation/quality/duration/timing/severity/associated sxs/prior treatment) Patient is a 71 y.o. female presenting with hematuria and diarrhea. The history is provided by the patient.  Hematuria This is a recurrent problem. The current episode started yesterday. The problem is unchanged. She describes the hematuria as gross hematuria. The hematuria occurs during the initial portion of her urinary stream. She reports no clotting in her urine stream. Her pain is at a severity of 9/10. The pain is severe. She describes her urine color as dark red. Associated symptoms include dysuria, nausea and vomiting. Pertinent negatives include no abdominal pain, chills or flank pain. Her sexual activity is non-contributory to the current illness. There is no history of kidney stones.  Diarrhea The primary symptoms include nausea, vomiting, diarrhea and dysuria. Primary symptoms do not include abdominal pain. The illness began yesterday. The onset was sudden. The problem has not changed since onset. The dysuria is associated with hematuria.  The illness does not include chills, anorexia, dysphagia or back pain. Significant associated medical issues include GERD.    Past Medical History  Diagnosis Date  . UTI (lower urinary tract infection)   . Asthma   . GERD (gastroesophageal reflux disease)     History reviewed. No pertinent past surgical history.  No family history on file.  History  Substance Use Topics  . Smoking status: Never Smoker   . Smokeless tobacco: Not on file  . Alcohol Use: No    OB History    Grav Para Term Preterm Abortions TAB SAB Ect Mult Living                  Review of Systems  Constitutional: Negative.  Negative for chills.  HENT: Negative.   Eyes: Negative.     Respiratory: Negative.   Cardiovascular: Negative.   Gastrointestinal: Positive for nausea, vomiting and diarrhea. Negative for dysphagia, abdominal pain and anorexia.  Genitourinary: Positive for dysuria and hematuria. Negative for flank pain.  Musculoskeletal: Negative.  Negative for back pain.  Skin: Negative.   Neurological: Negative.   Hematological: Negative.   Psychiatric/Behavioral: Negative.     Allergies  Contrast media  Home Medications   Current Outpatient Rx  Name Route Sig Dispense Refill  . ESOMEPRAZOLE MAGNESIUM 40 MG PO CPDR Oral Take 40 mg by mouth 2 (two) times a week. Takes on Wednesday and Friday.    Marland Kitchen NITROFURANTOIN MACROCRYSTAL 100 MG PO CAPS Oral Take 100 mg by mouth every 12 (twelve) hours. For 7 days. Started on 09/06/11      BP 117/68  Pulse 61  Temp(Src) 97.3 F (36.3 C) (Oral)  Resp 14  SpO2 100%  Physical Exam  Constitutional: She is oriented to person, place, and time. She appears well-developed and well-nourished. No distress.  HENT:  Head: Normocephalic and atraumatic.  Mouth/Throat: Oropharynx is clear and moist.  Eyes: Conjunctivae are normal. Pupils are equal, round, and reactive to light.  Neck: Normal range of motion. Neck supple.  Cardiovascular: Normal rate and regular rhythm.   Pulmonary/Chest: Effort normal and breath sounds normal.  Abdominal: Soft. Bowel sounds are normal. There is no tenderness. There is no rebound.  Musculoskeletal: Normal range of motion.  Neurological: She is alert and oriented to person, place, and time.  Skin: Skin is  warm and dry.  Psychiatric: She has a normal mood and affect.    ED Course  Procedures (including critical care time)  Labs Reviewed  URINALYSIS, ROUTINE W REFLEX MICROSCOPIC - Abnormal; Notable for the following:    APPearance TURBID (*)    Specific Gravity, Urine 1.003 (*)    Hgb urine dipstick LARGE (*)    Protein, ur 100 (*)    Leukocytes, UA LARGE (*)    All other components  within normal limits  BASIC METABOLIC PANEL - Abnormal; Notable for the following:    Glucose, Bld 104 (*)    All other components within normal limits  URINE MICROSCOPIC-ADD ON - Abnormal; Notable for the following:    Squamous Epithelial / LPF FEW (*)    Bacteria, UA MANY (*)    All other components within normal limits  CBC  DIFFERENTIAL   Ct Abdomen Pelvis Wo Contrast  09/30/2011  *RADIOLOGY REPORT*  Clinical Data: Hematuria; dysuria.  Lower abdominal pain, lower back pain, nausea, vomiting and diarrhea.  CT ABDOMEN AND PELVIS WITHOUT CONTRAST  Technique:  Multidetector CT imaging of the abdomen and pelvis was performed following the standard protocol without intravenous contrast.  Comparison: CT of the abdomen and pelvis performed 12/17/2010  Findings: The visualized lung bases are clear.  The liver and spleen are unremarkable in appearance.  The gallbladder is within normal limits.  The pancreas and adrenal glands are unremarkable.  The kidneys are unremarkable in appearance.  There is no evidence of hydronephrosis.  No renal or ureteral stones are seen.  No perinephric stranding is appreciated.  No free fluid is identified.  The small bowel is unremarkable in appearance.  The stomach is within normal limits.  No acute vascular abnormalities are seen.  Grossly stable calcified mesenteric nodes may reflect remote granulomatous disease.  Minimal scattered calcification is noted along the distal abdominal aorta.  The appendix remains borderline normal in size, without evidence for appendicitis.  Scattered diverticulosis is noted along the entirety of the colon, without evidence of diverticulitis.  The bladder is mildly distended; diffuse bladder wall thickening and mild associated soft tissue stranding may reflect cystitis, slightly more prominent than on the prior study.  The the patient is status post hysterectomy; no suspicious adnexal masses are seen. No inguinal lymphadenopathy is seen.  No acute  osseous abnormalities are identified.  IMPRESSION:  1.  Diffuse bladder wall thickening and mild associated soft tissue inflammation may reflect cystitis, slightly more prominent than on the prior study. 2.  Scattered diverticulosis along the entirety of the colon, without evidence of diverticulitis. 3.  Minimal scattered calcification along the distal abdominal aorta. 4.  Calcified mesenteric nodes, grossly stable from 2012, may reflect remote granulomatous disease.  Original Report Authenticated By: Tonia Ghent, M.D.     No diagnosis found.    MDM  Follow up with your family doctor. Return for worsening symptoms.  Fevers chills and follow up with urology       Coryn Mosso K Shailah Gibbins-Rasch, MD 09/30/11 [phone]

## 2012-03-04 ENCOUNTER — Emergency Department (HOSPITAL_COMMUNITY)
Admission: EM | Admit: 2012-03-04 | Discharge: 2012-03-04 | Disposition: A | Discharge status: Home / Self Care | Payer: Medicare Other | Attending: Emergency Medicine | Admitting: Emergency Medicine

## 2012-03-04 ENCOUNTER — Emergency Department (HOSPITAL_COMMUNITY): Payer: Medicare Other

## 2012-03-04 ENCOUNTER — Encounter (HOSPITAL_COMMUNITY): Payer: Self-pay | Admitting: *Deleted

## 2012-03-04 DIAGNOSIS — Z79899 Other long term (current) drug therapy: Secondary | ICD-10-CM | POA: Insufficient documentation

## 2012-03-04 DIAGNOSIS — M549 Dorsalgia, unspecified: Secondary | ICD-10-CM | POA: Insufficient documentation

## 2012-03-04 DIAGNOSIS — K219 Gastro-esophageal reflux disease without esophagitis: Secondary | ICD-10-CM | POA: Insufficient documentation

## 2012-03-04 DIAGNOSIS — M79609 Pain in unspecified limb: Secondary | ICD-10-CM | POA: Insufficient documentation

## 2012-03-04 DIAGNOSIS — R1032 Left lower quadrant pain: Secondary | ICD-10-CM | POA: Insufficient documentation

## 2012-03-04 DIAGNOSIS — J45909 Unspecified asthma, uncomplicated: Secondary | ICD-10-CM | POA: Insufficient documentation

## 2012-03-04 LAB — POCT I-STAT, CHEM 8
Calcium, Ion: 1.23 mmol/L (ref 1.13–1.30)
Chloride: 101 mEq/L (ref 96–112)
Creatinine, Ser: 0.7 mg/dL (ref 0.50–1.10)
Glucose, Bld: 89 mg/dL (ref 70–99)
Hemoglobin: 13.6 g/dL (ref 12.0–15.0)
Potassium: 4.5 mEq/L (ref 3.5–5.1)
TCO2: 28 mmol/L (ref 0–100)

## 2012-03-04 LAB — URINALYSIS, ROUTINE W REFLEX MICROSCOPIC
Bilirubin Urine: NEGATIVE
Hgb urine dipstick: NEGATIVE
Ketones, ur: NEGATIVE mg/dL
Specific Gravity, Urine: 1.006 (ref 1.005–1.030)
Urobilinogen, UA: 0.2 mg/dL (ref 0.0–1.0)
pH: 7 (ref 5.0–8.0)

## 2012-03-04 LAB — CBC WITH DIFFERENTIAL/PLATELET
Eosinophils Absolute: 1.2 10*3/uL — ABNORMAL HIGH (ref 0.0–0.7)
Eosinophils Relative: 18 % — ABNORMAL HIGH (ref 0–5)
HCT: 37.5 % (ref 36.0–46.0)
Hemoglobin: 12.3 g/dL (ref 12.0–15.0)
Lymphs Abs: 2.7 10*3/uL (ref 0.7–4.0)
MCH: 26.1 pg (ref 26.0–34.0)
MCHC: 32.8 g/dL (ref 30.0–36.0)
MCV: 79.4 fL (ref 78.0–100.0)
Monocytes Absolute: 0.3 10*3/uL (ref 0.1–1.0)
Monocytes Relative: 5 % (ref 3–12)
Neutro Abs: 2.6 10*3/uL (ref 1.7–7.7)
Platelets: 207 10*3/uL (ref 150–400)
RBC: 4.72 MIL/uL (ref 3.87–5.11)
RDW: 13.7 % (ref 11.5–15.5)

## 2012-03-04 MED ORDER — PROMETHAZINE HCL 25 MG PO TABS: 25.0000 mg | ORAL_TABLET | Freq: Four times a day (QID) | ORAL | Status: DC | PRN

## 2012-03-04 MED ORDER — MORPHINE SULFATE 4 MG/ML IJ SOLN
4.0000 mg | Freq: Once | INTRAMUSCULAR | Status: AC
  Administered 2012-03-04: 4 mg via INTRAVENOUS
  Filled 2012-03-04: qty 1

## 2012-03-04 MED ORDER — HYDROMORPHONE HCL PF 1 MG/ML IJ SOLN
1.0000 mg | Freq: Once | INTRAMUSCULAR | Status: AC
  Administered 2012-03-04: 1 mg via INTRAVENOUS
  Filled 2012-03-04: qty 1

## 2012-03-04 MED ORDER — HYDROCODONE-ACETAMINOPHEN 5-325 MG PO TABS: 1.0000 | ORAL_TABLET | Freq: Four times a day (QID) | ORAL | Status: AC | PRN

## 2012-03-04 MED ORDER — MORPHINE SULFATE 4 MG/ML IJ SOLN: 4.0000 mg | Freq: Once | INTRAMUSCULAR | Status: DC

## 2012-03-04 MED ORDER — ONDANSETRON 4 MG PO TBDP
ORAL_TABLET | ORAL | Status: AC
  Administered 2012-03-04: 4 mg via ORAL
  Filled 2012-03-04: qty 1

## 2012-03-04 MED ORDER — PREDNISONE 20 MG PO TABS: ORAL_TABLET | ORAL | Status: AC

## 2012-03-04 MED ORDER — ONDANSETRON HCL 4 MG/2ML IJ SOLN: 4.0000 mg | Freq: Once | INTRAMUSCULAR | Status: DC

## 2012-03-04 MED ORDER — ONDANSETRON 4 MG PO TBDP
4.0000 mg | ORAL_TABLET | Freq: Once | ORAL | Status: AC
  Administered 2012-03-04: 4 mg via ORAL

## 2012-03-04 NOTE — ED Notes (Signed)
IV dcd with cath in tact

## 2012-03-04 NOTE — ED Notes (Signed)
Pt amb to BR without problem, 3 family members at bedside

## 2012-03-04 NOTE — ED Notes (Signed)
Upon further questioning pt reports left lower quadrant pain. Pt reports hx of UTIs. Urine sent.

## 2012-03-04 NOTE — ED Provider Notes (Signed)
Medical screening examination/treatment/procedure(s) were conducted as a shared visit with non-physician practitioner(s) and myself.  I personally evaluated the patient during the encounter  Doug Sou, MD 03/04/12 1651

## 2012-03-04 NOTE — ED Provider Notes (Signed)
History     CSN: [id number]  Arrival date & time 03/04/12  0740   First MD Initiated Contact with Patient 03/04/12 [phone]      Chief Complaint  Patient presents with  . Back Pain  . Leg Pain    (Consider location/radiation/quality/duration/timing/severity/associated sxs/prior treatment) HPI Complaint of low back pain radiating to left leg onset one week ago worse with movement or standing for sleep or muscle pain also radiates to left lower portion of abdomen. No fever no incontinence no injury. Feels like a muscle pull. No urinary symptoms. Pain is improved with remains still. Treated with Tylenol without relief Past Medical History  Diagnosis Date  . UTI (lower urinary tract infection)   . Asthma   . GERD (gastroesophageal reflux disease)     History reviewed. No pertinent past surgical history.  History reviewed. No pertinent family history.  History  Substance Use Topics  . Smoking status: Never Smoker   . Smokeless tobacco: Not on file  . Alcohol Use: No    OB History    Grav Para Term Preterm Abortions TAB SAB Ect Mult Living                  Review of Systems  Constitutional: Negative.   HENT: Negative.   Respiratory: Negative.   Cardiovascular: Negative.   Gastrointestinal: Positive for abdominal pain.  Musculoskeletal: Positive for back pain.  Skin: Negative.   Neurological: Negative.   Hematological: Negative.   Psychiatric/Behavioral: Negative.     Allergies  Contrast media and Other  Home Medications   Current Outpatient Rx  Name Route Sig Dispense Refill  . ESOMEPRAZOLE MAGNESIUM 40 MG PO CPDR Oral Take 40 mg by mouth 2 (two) times a week. Takes on Wednesday and Friday.    Marland Kitchen PRESCRIPTION MEDICATION  Some kind of spray. Last took on Wednesday 02/29/12.      BP 145/78  Pulse 66  Temp 97.9 F (36.6 C) (Oral)  Resp 16  SpO2 99%  Physical Exam  Nursing note and vitals reviewed. Constitutional: She appears well-developed and  well-nourished. She appears distressed.       Appears mildly uncomfortable  HENT:  Head: Normocephalic and atraumatic.  Eyes: Conjunctivae are normal. Pupils are equal, round, and reactive to light.  Neck: Neck supple. No tracheal deviation present. No thyromegaly present.  Cardiovascular: Normal rate and regular rhythm.   No murmur heard. Pulmonary/Chest: Effort normal and breath sounds normal.  Abdominal: Soft. Bowel sounds are normal. She exhibits mass. She exhibits no distension. There is tenderness. There is no guarding.       Left lower quadrant tenderness  Musculoskeletal: Normal range of motion. She exhibits no edema and no tenderness.       Left-sided paralumbar tenderness  Neurological: She is alert. Coordination normal.  Skin: Skin is warm and dry. No rash noted.  Psychiatric: She has a normal mood and affect.    ED Course  Procedures (including critical care time)   Labs Reviewed  URINALYSIS, ROUTINE W REFLEX MICROSCOPIC  CBC WITH DIFFERENTIAL   No results found. Results for orders placed during the hospital encounter of 03/04/12  URINALYSIS, ROUTINE W REFLEX MICROSCOPIC      Component Value Range   Color, Urine YELLOW  YELLOW   APPearance CLEAR  CLEAR   Specific Gravity, Urine 1.006  1.005 - 1.030   pH 7.0  5.0 - 8.0   Glucose, UA NEGATIVE  NEGATIVE mg/dL   Hgb urine dipstick NEGATIVE  NEGATIVE   Bilirubin Urine NEGATIVE  NEGATIVE   Ketones, ur NEGATIVE  NEGATIVE mg/dL   Protein, ur NEGATIVE  NEGATIVE mg/dL   Urobilinogen, UA 0.2  0.0 - 1.0 mg/dL   Nitrite NEGATIVE  NEGATIVE   Leukocytes, UA NEGATIVE  NEGATIVE  CBC WITH DIFFERENTIAL      Component Value Range   WBC 6.8  4.0 - 10.5 K/uL   RBC 4.72  3.87 - 5.11 MIL/uL   Hemoglobin 12.3  12.0 - 15.0 g/dL   HCT 16.1  09.6 - 04.5 %   MCV 79.4  78.0 - 100.0 fL   MCH 26.1  26.0 - 34.0 pg   MCHC 32.8  30.0 - 36.0 g/dL   RDW 40.9  81.1 - 91.4 %   Platelets 207  150 - 400 K/uL   Neutrophils Relative 38 (*) 43 -  77 %   Neutro Abs 2.6  1.7 - 7.7 K/uL   Lymphocytes Relative 39  12 - 46 %   Lymphs Abs 2.7  0.7 - 4.0 K/uL   Monocytes Relative 5  3 - 12 %   Monocytes Absolute 0.3  0.1 - 1.0 K/uL   Eosinophils Relative 18 (*) 0 - 5 %   Eosinophils Absolute 1.2 (*) 0.0 - 0.7 K/uL   Basophils Relative 1  0 - 1 %   Basophils Absolute 0.1  0.0 - 0.1 K/uL  POCT I-STAT, CHEM 8      Component Value Range   Sodium 141  135 - 145 mEq/L   Potassium 4.5  3.5 - 5.1 mEq/L   Chloride 101  96 - 112 mEq/L   BUN 15  6 - 23 mg/dL   Creatinine, Ser 7.82  0.50 - 1.10 mg/dL   Glucose, Bld 89  70 - 99 mg/dL   Calcium, Ion 9.56  2.13 - 1.30 mmol/L   TCO2 28  0 - 100 mmol/L   Hemoglobin 13.6  12.0 - 15.0 g/dL   HCT 08.6  57.8 - 46.9 %   Ct Abdomen Pelvis Wo Contrast  03/04/2012  *RADIOLOGY REPORT*  Clinical Data: Left lower quadrant abdomen and back pain.  CT ABDOMEN AND PELVIS WITHOUT CONTRAST  Technique:  Multidetector CT imaging of the abdomen and pelvis was performed following the standard protocol without intravenous contrast.  Comparison: 09/30/2011.  Findings: Scattered colonic diverticula without evidence of diverticulitis.  Normal appearing appendix in the upper right pelvis.  No gastric or small bowel abnormalities and no enlarged lymph nodes.  Unremarkable noncontrasted appearance of the liver, spleen, pancreas, adrenal glands, kidneys and urinary bladder.  Surgically absent uterus.  No adnexal masses.  Distended gallbladder.  No gallstones, wall thickening or pericholecystic fluid seen.  Clear lung bases.  Minimal lumbar and lower thoracic spine degenerative changes.  IMPRESSION: No acute abnormality.   Original Report Authenticated By: Darrol Angel, M.D.      No diagnosis found.2:10 PM patient states pain was transiently improved of treatment with intravenous morphine however now complains of moderate to severe low back pain. Additional narcotic analgesia ordered   MDM  Plan pain control   diagnosis lumbar  radiculopathy         Doug Sou, MD 03/04/12 1524

## 2012-03-04 NOTE — ED Provider Notes (Signed)
3:38 PM Received report from Dr. Remi Haggard. Patient arrived with chief complaint of left low back pain with radiation to the left lower quadrant of the abdomen and down the back. Date of the leg. CT scan is negative. UA is clear. She has a radiculopathy. Plan is to make sure the patien's pain is controlled and that she can ambulate. Patient can be discharged if these are met with outpatient follow up with her primary care physician. Patient's family is in the room. She states  her pain Right now as a 4/10 and has decreased significantly. She does have a walker at home and feels that she is safe to go home at this point. CV: RRR, No M/R/G, Peripheral pulses intact. No peripheral edema. Lungs: CTAB Abd: Soft, Non tender, non distended Musculoskeletal: Patient is tender to palpation of the left lumbar spinal area. Nontender to palpation of the spinous processes. She is tender to palpation of her left iliopsoas. Positive straight leg raise. Worse when sitting up, better when lying down. Patient is also complaining of some nausea after taking her pain medications. Am giving her 4 mg Zofran. She is able to and ambulate and is tolerating by mouth fluids.I Am discharging her home with outpatient followup. All questions answered fully.Discussed reasons to seek immediate care. Patient expresses understanding and agrees with plan.   Arthor Captain, PA-C 03/04/12 1611

## 2012-03-04 NOTE — ED Notes (Signed)
Pt reports lower back pain radiating down left leg, states she has to pull her self up a lot and she pulled a muscle.

## 2012-03-04 NOTE — ED Notes (Signed)
MD at bedside going over d/c instructions.

## 2012-03-04 NOTE — ED Notes (Signed)
Pt returned from CT scan.

## 2012-07-23 ENCOUNTER — Emergency Department (INDEPENDENT_AMBULATORY_CARE_PROVIDER_SITE_OTHER)
Admission: EM | Admit: 2012-07-23 | Discharge: 2012-07-23 | Disposition: A | Discharge status: Home / Self Care | Payer: Medicare Other | Source: Home / Self Care | Attending: Emergency Medicine | Admitting: Emergency Medicine

## 2012-07-23 ENCOUNTER — Encounter (HOSPITAL_COMMUNITY): Payer: Self-pay | Admitting: *Deleted

## 2012-07-23 DIAGNOSIS — J069 Acute upper respiratory infection, unspecified: Secondary | ICD-10-CM

## 2012-07-23 MED ORDER — IPRATROPIUM BROMIDE 0.06 % NA SOLN: 2.0000 | Freq: Four times a day (QID) | NASAL | Status: DC

## 2012-07-23 MED ORDER — CETIRIZINE HCL 10 MG PO TABS: 10.0000 mg | ORAL_TABLET | Freq: Every day | ORAL | Status: DC

## 2012-07-23 NOTE — ED Provider Notes (Signed)
History     CSN: [id number]  Arrival date & time 07/23/12  1113   First MD Initiated Contact with Patient 07/23/12 1131      No chief complaint on file.   (Consider location/radiation/quality/duration/timing/severity/associated sxs/prior treatment) Patient is a 72 y.o. female presenting with pharyngitis. The history is provided by the patient.  Sore Throat This is a new problem. The current episode started more than 2 days ago. The problem has not changed since onset.Pertinent negatives include no chest pain, no abdominal pain and no shortness of breath. The symptoms are aggravated by swallowing. Nothing relieves the symptoms. She has tried nothing for the symptoms.    Past Medical History  Diagnosis Date  . UTI (lower urinary tract infection)   . Asthma   . GERD (gastroesophageal reflux disease)     No past surgical history on file.  No family history on file.  History  Substance Use Topics  . Smoking status: Never Smoker   . Smokeless tobacco: Not on file  . Alcohol Use: No    OB History    Grav Para Term Preterm Abortions TAB SAB Ect Mult Living                  Review of Systems  Constitutional: Negative.   HENT: Positive for congestion, sore throat, rhinorrhea and postnasal drip.   Respiratory: Negative for cough and shortness of breath.   Cardiovascular: Negative for chest pain.  Gastrointestinal: Negative for nausea, vomiting and abdominal pain.    Allergies  Contrast media and Other  Home Medications   Current Outpatient Rx  Name  Route  Sig  Dispense  Refill  . ACETAMINOPHEN 500 MG PO TABS   Oral   Take 1,000 mg by mouth every 6 (six) hours as needed. For back pain         . ALBUTEROL SULFATE HFA 108 (90 BASE) MCG/ACT IN AERS   Inhalation   Inhale 2 puffs into the lungs every 6 (six) hours as needed. For shortness of breath         . BUDESONIDE-FORMOTEROL FUMARATE 160-4.5 MCG/ACT IN AERO   Inhalation   Inhale 2 puffs into the lungs 2  (two) times daily.         Marland Kitchen CETIRIZINE HCL 10 MG PO TABS   Oral   Take 1 tablet (10 mg total) by mouth daily. One tab daily for allergies   30 tablet   1   . ESOMEPRAZOLE MAGNESIUM 40 MG PO CPDR   Oral   Take 40 mg by mouth 2 (two) times a week. Takes on Wednesday and Friday.         Marland Kitchen HYOSCYAMINE SULFATE 0.125 MG SL SUBL   Sublingual   Place 0.125 mg under the tongue 2 (two) times daily as needed. For bladder spasms         . IPRATROPIUM BROMIDE 0.06 % NA SOLN   Nasal   Place 2 sprays into the nose 4 (four) times daily.   15 mL   1   . PROMETHAZINE HCL 25 MG PO TABS   Oral   Take 1 tablet (25 mg total) by mouth every 6 (six) hours as needed for nausea.   20 tablet   0   . SIMVASTATIN 10 MG PO TABS   Oral   Take 10 mg by mouth as directed. Take on Monday, Wednesday, and Friday at bedtime           BP 153/67  Pulse 68  Temp 98.3 F (36.8 C) (Oral)  Resp 20  SpO2 99%  Physical Exam  Nursing note and vitals reviewed. Constitutional: She is oriented to person, place, and time. She appears well-developed and well-nourished.  HENT:  Head: Normocephalic.  Right Ear: External ear normal.  Left Ear: External ear normal.  Nose: Mucosal edema and rhinorrhea present.  Mouth/Throat: Oropharynx is clear and moist.  Eyes: Conjunctivae normal are normal. Pupils are equal, round, and reactive to light.  Neck: Normal range of motion. Neck supple.  Cardiovascular: Normal rate, regular rhythm, normal heart sounds and intact distal pulses.   Pulmonary/Chest: Effort normal and breath sounds normal.  Abdominal: Soft. Bowel sounds are normal.  Lymphadenopathy:    She has no cervical adenopathy.  Neurological: She is alert and oriented to person, place, and time.  Skin: Skin is warm and dry.    ED Course  Procedures (including critical care time)  Labs Reviewed - No data to display No results found.   1. URI (upper respiratory infection)       MDM           Linna Hoff, MD 07/23/12 1450

## 2012-07-23 NOTE — ED Notes (Signed)
Sore throat and runny nose x 3 days  Without fever

## 2012-12-09 ENCOUNTER — Emergency Department (HOSPITAL_COMMUNITY): Payer: Medicare Other

## 2012-12-09 ENCOUNTER — Emergency Department (HOSPITAL_COMMUNITY)
Admission: EM | Admit: 2012-12-09 | Discharge: 2012-12-09 | Disposition: A | Discharge status: Home / Self Care | Payer: Medicare Other | Attending: Emergency Medicine | Admitting: Emergency Medicine

## 2012-12-09 ENCOUNTER — Encounter (HOSPITAL_COMMUNITY): Payer: Self-pay | Admitting: *Deleted

## 2012-12-09 DIAGNOSIS — K219 Gastro-esophageal reflux disease without esophagitis: Secondary | ICD-10-CM | POA: Insufficient documentation

## 2012-12-09 DIAGNOSIS — R079 Chest pain, unspecified: Secondary | ICD-10-CM | POA: Insufficient documentation

## 2012-12-09 DIAGNOSIS — J9801 Acute bronchospasm: Secondary | ICD-10-CM | POA: Insufficient documentation

## 2012-12-09 DIAGNOSIS — Z79899 Other long term (current) drug therapy: Secondary | ICD-10-CM | POA: Insufficient documentation

## 2012-12-09 DIAGNOSIS — Z87440 Personal history of urinary (tract) infections: Secondary | ICD-10-CM | POA: Insufficient documentation

## 2012-12-09 DIAGNOSIS — J45909 Unspecified asthma, uncomplicated: Secondary | ICD-10-CM | POA: Insufficient documentation

## 2012-12-09 MED ORDER — PREDNISONE 20 MG PO TABS
60.0000 mg | ORAL_TABLET | Freq: Once | ORAL | Status: AC
  Administered 2012-12-09: 60 mg via ORAL
  Filled 2012-12-09: qty 1
  Filled 2012-12-09: qty 2

## 2012-12-09 MED ORDER — ALBUTEROL SULFATE (5 MG/ML) 0.5% IN NEBU
5.0000 mg | INHALATION_SOLUTION | Freq: Once | RESPIRATORY_TRACT | Status: AC
  Administered 2012-12-09: 5 mg via RESPIRATORY_TRACT

## 2012-12-09 MED ORDER — IPRATROPIUM BROMIDE 0.02 % IN SOLN
0.5000 mg | Freq: Once | RESPIRATORY_TRACT | Status: AC
  Administered 2012-12-09: 0.5 mg via RESPIRATORY_TRACT
  Filled 2012-12-09: qty 2.5

## 2012-12-09 MED ORDER — ALBUTEROL SULFATE (5 MG/ML) 0.5% IN NEBU
INHALATION_SOLUTION | RESPIRATORY_TRACT | Status: AC
  Filled 2012-12-09: qty 1

## 2012-12-09 MED ORDER — PREDNISONE 20 MG PO TABS: 40.0000 mg | ORAL_TABLET | Freq: Every day | ORAL | Status: DC

## 2012-12-09 MED ORDER — ALBUTEROL SULFATE HFA 108 (90 BASE) MCG/ACT IN AERS
2.0000 | INHALATION_SPRAY | Freq: Once | RESPIRATORY_TRACT | Status: AC
  Administered 2012-12-09: 2 via RESPIRATORY_TRACT
  Filled 2012-12-09: qty 6.7

## 2012-12-09 MED ORDER — ALBUTEROL (5 MG/ML) CONTINUOUS INHALATION SOLN
15.0000 mg | INHALATION_SOLUTION | Freq: Once | RESPIRATORY_TRACT | Status: AC
  Administered 2012-12-09: 15 mg via RESPIRATORY_TRACT
  Filled 2012-12-09: qty 20

## 2012-12-09 MED ORDER — LORAZEPAM 1 MG PO TABS
0.5000 mg | ORAL_TABLET | Freq: Once | ORAL | Status: AC
  Administered 2012-12-09: 0.5 mg via ORAL
  Filled 2012-12-09: qty 1

## 2012-12-09 NOTE — ED Notes (Addendum)
Reports having sob and non productive cough since yesterday, hx of asthma. Reports pain to entire throat, chest, abd. ekg done at triage, audible wheezing noted at triage, able is able to speak in full sentences. spo2 97%.

## 2012-12-09 NOTE — ED Notes (Signed)
Pt reports pain from throat running down mid sternal line since last night. States she has cough, but is "unable to get it out." Weak NP cough noted.

## 2012-12-13 NOTE — ED Provider Notes (Signed)
History    72 year old female with cough and shortness of breath. Onset yesterday. Persistent since. No fevers or chills. She feels like she has something deep in her chest that she cannot get out. She rubs her hand up and down her sternum and she says this. Wheezing. No unusual leg pain or swelling. No intervention prior to arrival. History via brother translating.  CSN: [id number]  Arrival date & time 12/09/12  1658   First MD Initiated Contact with Patient 12/09/12 1756      Chief Complaint  Patient presents with  . Shortness of Breath  . Chest Pain    (Consider location/radiation/quality/duration/timing/severity/associated sxs/prior treatment) HPI  Past Medical History  Diagnosis Date  . UTI (lower urinary tract infection)   . Asthma   . GERD (gastroesophageal reflux disease)     Past Surgical History  Procedure Laterality Date  . Abdominal surgery    . Abdominal hysterectomy      History reviewed. No pertinent family history.  History  Substance Use Topics  . Smoking status: Never Smoker   . Smokeless tobacco: Not on file  . Alcohol Use: No    OB History   Grav Para Term Preterm Abortions TAB SAB Ect Mult Living                  Review of Systems  All systems reviewed and negative, other than as noted in HPI.   Allergies  Contrast media and Other  Home Medications   Current Outpatient Rx  Name  Route  Sig  Dispense  Refill  . albuterol (PROVENTIL HFA;VENTOLIN HFA) 108 (90 BASE) MCG/ACT inhaler   Inhalation   Inhale 2 puffs into the lungs 2 (two) times daily. For shortness of breath         . budesonide-formoterol (SYMBICORT) 160-4.5 MCG/ACT inhaler   Inhalation   Inhale 2 puffs into the lungs daily.          . Ergocalciferol (VITAMIN D2 PO)   Oral   Take 1.25 mg by mouth See admin instructions. Take every Tuesday and Friday         . hyoscyamine (LEVSIN SL) 0.125 MG SL tablet   Sublingual   Place 0.125 mg under the tongue 2 (two)  times daily as needed. For bladder spasms         . simvastatin (ZOCOR) 10 MG tablet   Oral   Take 10 mg by mouth See admin instructions. Take one tablet on Mondays and Wednesdays         . esomeprazole (NEXIUM) 40 MG capsule   Oral   Take 40 mg by mouth 2 (two) times a week. Takes on Wednesday and Friday.         . predniSONE (DELTASONE) 20 MG tablet   Oral   Take 2 tablets (40 mg total) by mouth daily.   10 tablet   0     BP 131/61  Pulse 111  Temp(Src) 98.9 F (37.2 C) (Oral)  Resp 27  SpO2 100%  Physical Exam  Nursing note and vitals reviewed. Constitutional: She appears well-developed and well-nourished. No distress.  HENT:  Head: Normocephalic and atraumatic.  Mouth/Throat: No oropharyngeal exudate.  Eyes: Conjunctivae are normal. Right eye exhibits no discharge. Left eye exhibits no discharge.  Neck: Neck supple.  Cardiovascular: Normal rate, regular rhythm and normal heart sounds.  Exam reveals no gallop and no friction rub.   No murmur heard. Pulmonary/Chest: She has wheezes.  Appears to be  speaking in full sentences. Mild tachypnea, but no accessory muscle usage. Diffuse wheezing bilaterally.  Abdominal: Soft. She exhibits no distension. There is no tenderness.  Musculoskeletal: She exhibits no edema and no tenderness.  Lower extremities symmetric as compared to each other. No calf tenderness. Negative Homan's. No palpable cords.   Neurological: She is alert.  Skin: Skin is warm and dry.  Psychiatric: She has a normal mood and affect. Her behavior is normal. Thought content normal.    ED Course  Procedures (including critical care time)  Labs Reviewed - No data to display No results found.  Dg Chest Portable 1 View  12/09/2012   *RADIOLOGY REPORT*  Clinical Data: Short of breath.  Chest pain.  PORTABLE CHEST - 1 VIEW  Comparison: CT 12/17/2010.  Findings: Low volume chest.  Bilateral basilar atelectasis.  Low volumes accentuate the cardiopericardial  silhouette. Monitoring leads are projected over the chest.  Airspace disease is difficult to exclude but opacity at the bases is most compatible with atelectasis.  Mediastinal contours appear within normal limits.  IMPRESSION: Low volume chest.   Original Report Authenticated By: Andreas Newport, M.D.    1. Bronchospasm       MDM   [phone] with cough/wheezing. Afebrile. No accessory muscle usage. 02 sats good on ra. Improved air movement with nebs. Plan continues albuterol prn (pt sent home with inhaler) and steroids.       Raeford Razor, MD 12/13/12 [phone]

## 2012-12-18 ENCOUNTER — Ambulatory Visit: Payer: Medicare Other

## 2012-12-18 ENCOUNTER — Ambulatory Visit (INDEPENDENT_AMBULATORY_CARE_PROVIDER_SITE_OTHER): Payer: Medicare Other | Admitting: Family Medicine

## 2012-12-18 ENCOUNTER — Encounter (HOSPITAL_COMMUNITY): Payer: Self-pay

## 2012-12-18 ENCOUNTER — Inpatient Hospital Stay (HOSPITAL_COMMUNITY)
Admission: EM | Admit: 2012-12-18 | Discharge: 2012-12-22 | DRG: 372 | Disposition: A | Discharge status: Home / Self Care | Payer: Medicare Other | Attending: Internal Medicine | Admitting: Internal Medicine

## 2012-12-18 VITALS — BP 97/66 | HR 78 | Temp 98.0°F | Resp 17 | Ht 59.5 in | Wt 110.0 lb

## 2012-12-18 DIAGNOSIS — E785 Hyperlipidemia, unspecified: Secondary | ICD-10-CM | POA: Diagnosis present

## 2012-12-18 DIAGNOSIS — R079 Chest pain, unspecified: Secondary | ICD-10-CM

## 2012-12-18 DIAGNOSIS — J45909 Unspecified asthma, uncomplicated: Secondary | ICD-10-CM | POA: Diagnosis present

## 2012-12-18 DIAGNOSIS — B370 Candidal stomatitis: Secondary | ICD-10-CM

## 2012-12-18 DIAGNOSIS — R109 Unspecified abdominal pain: Secondary | ICD-10-CM

## 2012-12-18 DIAGNOSIS — K219 Gastro-esophageal reflux disease without esophagitis: Secondary | ICD-10-CM

## 2012-12-18 DIAGNOSIS — A0472 Enterocolitis due to Clostridium difficile, not specified as recurrent: Secondary | ICD-10-CM

## 2012-12-18 DIAGNOSIS — K529 Noninfective gastroenteritis and colitis, unspecified: Secondary | ICD-10-CM | POA: Diagnosis present

## 2012-12-18 LAB — POCT URINALYSIS DIPSTICK
Bilirubin, UA: NEGATIVE
Glucose, UA: NEGATIVE
Ketones, UA: NEGATIVE
Nitrite, UA: NEGATIVE
Protein, UA: NEGATIVE
Spec Grav, UA: <=1.005
Urobilinogen, UA: 0.2
pH, UA: 5.5

## 2012-12-18 LAB — POCT CBC
Granulocyte percent: 78.9 %G (ref 37–80)
HCT, POC: 36.6 % — AB (ref 37.7–47.9)
Hemoglobin: 11.1 g/dL — AB (ref 12.2–16.2)
Lymph, poc: 2.2 (ref 0.6–3.4)
MCH, POC: 25.5 pg — AB (ref 27–31.2)
MCHC: 30.3 g/dL — AB (ref 31.8–35.4)
MCV: 84 fL (ref 80–97)
MID (cbc): 0.7 (ref 0–0.9)
MPV: 9.6 fL (ref 0–99.8)
POC Granulocyte: 11 — AB (ref 2–6.9)
POC LYMPH PERCENT: 15.8 %L (ref 10–50)
POC MID %: 5.3 %M (ref 0–12)
Platelet Count, POC: 208 10*3/uL (ref 142–424)
RBC: 4.36 M/uL (ref 4.04–5.48)
RDW, POC: 14.5 %
WBC: 14 10*3/uL — AB (ref 4.6–10.2)

## 2012-12-18 LAB — COMPREHENSIVE METABOLIC PANEL
ALT: 24 U/L (ref 0–35)
AST: 23 U/L (ref 0–37)
CO2: 27 mEq/L (ref 19–32)
Calcium: 8.9 mg/dL (ref 8.4–10.5)
Chloride: 98 mEq/L (ref 96–112)
GFR calc non Af Amer: >90 mL/min (ref >90)
Sodium: 134 mEq/L — ABNORMAL LOW (ref 135–145)
Total Bilirubin: 0.5 mg/dL (ref 0.3–1.2)

## 2012-12-18 LAB — CBC WITH DIFFERENTIAL/PLATELET
Basophils Absolute: 0 10*3/uL (ref 0.0–0.1)
Eosinophils Absolute: 0.6 10*3/uL (ref 0.0–0.7)
Eosinophils Relative: 4 % (ref 0–5)
Lymphocytes Relative: 17 % (ref 12–46)
MCV: 76.9 fL — ABNORMAL LOW (ref 78.0–100.0)
Neutro Abs: 11.9 10*3/uL — ABNORMAL HIGH (ref 1.7–7.7)
Platelets: 241 10*3/uL (ref 150–400)
RDW: 13 % (ref 11.5–15.5)
WBC: 16.2 10*3/uL — ABNORMAL HIGH (ref 4.0–10.5)

## 2012-12-18 LAB — POCT UA - MICROSCOPIC ONLY
Casts, Ur, LPF, POC: NEGATIVE
Crystals, Ur, HPF, POC: NEGATIVE
Mucus, UA: NEGATIVE
Yeast, UA: NEGATIVE

## 2012-12-18 LAB — POCT I-STAT TROPONIN I: Troponin i, poc: 0 ng/mL (ref 0.00–0.08)

## 2012-12-18 NOTE — ED Notes (Signed)
Pt sent here from [facility] for further evaluation of generalized abd pain and diarrhea starting Sunday and central chest pain increase pain when coughing x1 week. Pt denies N/V, congestion, or SOB

## 2012-12-18 NOTE — Progress Notes (Signed)
Is a 72 year old [place] woman accompanied by her son. She's complaining of asthma, shortness of breath, cough, abdominal pain, and diarrhea. She was recently seen for a asthma attack and this was broken with inhalers and her breathing improved as of a week ago. Nevertheless on Sunday she developed diarrhea and abdominal pain and this is persisted.  Patient has a history of gastritis and is currently taking Nexium for this. She's had no vomiting.  Patient describes a cough is persistent associated with wheezing and small amount of mucus production  Objective: Elderly woman who appears to be in moderate distress lying on her side and holding her abdomen. HEENT unremarkable Chest: Diffuse rhonchi and wheezes bilaterally on inspiration and expiration Heart: Regular and rapid at about 100 beats per minute, no murmur heard although noisy airways sounds obscure auscultation Abdomen: Soft with guarding, and tender diffusely with no definite masses and no HSM.  [facility] reading (PRIMARY) by  Dr. Milus Glazier:  3-way of abdomen:  Few dilated loops of bowel.  Chest unchanged  Results for orders placed in visit on 12/18/12  POCT UA - MICROSCOPIC ONLY      Result Value Range   WBC, Ur, HPF, POC 2-4     RBC, urine, microscopic 2-4     Bacteria, U Microscopic trace     Mucus, UA neg     Epithelial cells, urine per micros 1-3     Crystals, Ur, HPF, POC neg     Casts, Ur, LPF, POC neg     Yeast, UA neg    POCT URINALYSIS DIPSTICK      Result Value Range   Color, UA yellow     Clarity, UA clear     Glucose, UA neg     Bilirubin, UA neg     Ketones, UA neg     Spec Grav, UA <=1.005     Blood, UA moderate     pH, UA 5.5     Protein, UA neg     Urobilinogen, UA 0.2     Nitrite, UA n     Leukocytes, UA small (1+)    POCT CBC      Result Value Range   WBC 14.0 (*) 4.6 - 10.2 K/uL   Lymph, poc 2.2  0.6 - 3.4   POC LYMPH PERCENT 15.8  10 - 50 %L   MID (cbc) 0.7  0 - 0.9   POC MID % 5.3  0 - 12 %M    POC Granulocyte 11.0 (*) 2 - 6.9   Granulocyte percent 78.9  37 - 80 %G   RBC 4.36  4.04 - 5.48 M/uL   Hemoglobin 11.1 (*) 12.2 - 16.2 g/dL   HCT, POC 16.1 (*) 09.6 - 47.9 %   MCV 84.0  80 - 97 fL   MCH, POC 25.5 (*) 27 - 31.2 pg   MCHC 30.3 (*) 31.8 - 35.4 g/dL   RDW, POC 04.5     Platelet Count, POC 208  142 - 424 K/uL   MPV 9.6  0 - 99.8 fL  .  Assessment: Given the severity of the patient's pain, and elevated white count, and the diffuse tenderness on abdominal exam, but patient needs further workup in the emergency room to rule out surgical causes of abdominal pain.  Plan: Patient referred to emergency department.  Signed, Elvina Sidle, Md

## 2012-12-18 NOTE — Patient Instructions (Signed)
Go directly to [facility] Emergency Department

## 2012-12-18 NOTE — ED Provider Notes (Signed)
History     CSN: [id number]  Arrival date & time 12/18/12  2047   None     Chief Complaint  Patient presents with  . Abdominal Pain    (Consider location/radiation/quality/duration/timing/severity/associated sxs/prior treatment) HPI This is a 72 year old female who was sent over from our urgent care after being evaluated for abdominal pain and diarrhea that began 2 days ago. The symptoms have persisted. She's had no associated nausea or vomiting. She describes the pain as "very bad" but has difficulty characterizing it in [place]. The pain is located diffusely. The pain and diarrhea are exacerbated by any attempt to eat or drink, to the point that she is afraid to take anything by mouth. The diarrhea is described as liquid. She has had subjective fever.  She is also complaining of shortness of breath and cough related to her chronic asthma. The cough is occasionally productive of mucus. There is some chest soreness with coughing.  Past Medical History  Diagnosis Date  . UTI (lower urinary tract infection)   . Asthma   . GERD (gastroesophageal reflux disease)     Past Surgical History  Procedure Laterality Date  . Abdominal surgery    . Abdominal hysterectomy      History reviewed. No pertinent family history.  History  Substance Use Topics  . Smoking status: Never Smoker   . Smokeless tobacco: Not on file  . Alcohol Use: No    OB History   Grav Para Term Preterm Abortions TAB SAB Ect Mult Living                  Review of Systems  All other systems reviewed and are negative.    Allergies  Contrast media and Other  Home Medications   Current Outpatient Rx  Name  Route  Sig  Dispense  Refill  . albuterol (PROVENTIL HFA;VENTOLIN HFA) 108 (90 BASE) MCG/ACT inhaler   Inhalation   Inhale 2 puffs into the lungs 2 (two) times daily. For shortness of breath         . amoxicillin-clavulanate (AUGMENTIN) 600-42.9 MG/5ML suspension   Oral   Take 900 mg by mouth  2 (two) times daily. For 10 days, started 6.10.14         . bismuth subsalicylate (PEPTO BISMOL) 262 MG/15ML suspension   Oral   Take 30 mLs by mouth every 6 (six) hours as needed for indigestion.         . budesonide-formoterol (SYMBICORT) 160-4.5 MCG/ACT inhaler   Inhalation   Inhale 2 puffs into the lungs daily.          . Ergocalciferol (VITAMIN D2 PO)   Oral   Take 1.25 mg by mouth 2 (two) times a week. Take every Tuesday and Friday         . esomeprazole (NEXIUM) 40 MG capsule   Oral   Take 40 mg by mouth 2 (two) times a week. Takes on Wednesday and Friday.         . hyoscyamine (LEVSIN SL) 0.125 MG SL tablet   Sublingual   Place 0.125 mg under the tongue 2 (two) times daily as needed. For bladder spasms         . predniSONE (DELTASONE) 20 MG tablet   Oral   Take 2 tablets (40 mg total) by mouth daily.   10 tablet   0   . simvastatin (ZOCOR) 10 MG tablet   Oral   Take 10 mg by mouth 2 (two) times  a week. Take one tablet on Mondays and Wednesdays           BP 113/53  Pulse 63  Temp(Src) 97.8 F (36.6 C) (Oral)  Resp 16  Ht 4\' 11"  (1.499 m)  Wt 110 lb (49.896 kg)  BMI 22.21 kg/m2  SpO2 100%  Physical Exam General: Well-developed, well-nourished female in no acute distress; appearance consistent with age of record HENT: normocephalic, atraumatic Eyes: pupils equal round and reactive to light; extraocular muscles intact Neck: supple Heart: regular rate and rhythm Lungs: Wheezing on expiration Abdomen: soft; nondistended; diffuse tenderness; no masses or hepatosplenomegaly; bowel sounds present Extremities: No deformity; full range of motion; pulses normal Neurologic: Awake, alert; motor function intact in all extremities and symmetric; no facial droop Skin: Warm and dry Psychiatric: Flat affect    ED Course  Procedures (including critical care time)     MDM   Nursing notes and vitals signs, including pulse oximetry,  reviewed.  Summary of this visit's results, reviewed by myself:  Labs:  Results for orders placed during the hospital encounter of 12/18/12 (from the past 24 hour(s))  CBC WITH DIFFERENTIAL     Status: Abnormal   Collection Time    12/18/12  9:20 PM      Result Value Range   WBC 16.2 (*) 4.0 - 10.5 K/uL   RBC 5.03  3.87 - 5.11 MIL/uL   Hemoglobin 13.3  12.0 - 15.0 g/dL   HCT 40.9  81.1 - 91.4 %   MCV 76.9 (*) 78.0 - 100.0 fL   MCH 26.4  26.0 - 34.0 pg   MCHC 34.4  30.0 - 36.0 g/dL   RDW 78.2  95.6 - 21.3 %   Platelets 241  150 - 400 K/uL   Neutrophils Relative % 74  43 - 77 %   Neutro Abs 11.9 (*) 1.7 - 7.7 K/uL   Lymphocytes Relative 17  12 - 46 %   Lymphs Abs 2.7  0.7 - 4.0 K/uL   Monocytes Relative 6  3 - 12 %   Monocytes Absolute 1.0  0.1 - 1.0 K/uL   Eosinophils Relative 4  0 - 5 %   Eosinophils Absolute 0.6  0.0 - 0.7 K/uL   Basophils Relative 0  0 - 1 %   Basophils Absolute 0.0  0.0 - 0.1 K/uL  COMPREHENSIVE METABOLIC PANEL     Status: Abnormal   Collection Time    12/18/12  9:20 PM      Result Value Range   Sodium 134 (*) 135 - 145 mEq/L   Potassium 3.5  3.5 - 5.1 mEq/L   Chloride 98  96 - 112 mEq/L   CO2 27  19 - 32 mEq/L   Glucose, Bld 104 (*) 70 - 99 mg/dL   BUN 6  6 - 23 mg/dL   Creatinine, Ser 0.86  0.50 - 1.10 mg/dL   Calcium 8.9  8.4 - 57.8 mg/dL   Total Protein 7.6  6.0 - 8.3 g/dL   Albumin 3.3 (*) 3.5 - 5.2 g/dL   AST 23  0 - 37 U/L   ALT 24  0 - 35 U/L   Alkaline Phosphatase 75  39 - 117 U/L   Total Bilirubin 0.5  0.3 - 1.2 mg/dL   GFR calc non Af Amer >90  >90 mL/min   GFR calc Af Amer >90  >90 mL/min  LIPASE, BLOOD     Status: None   Collection Time  12/18/12  9:20 PM      Result Value Range   Lipase 11  11 - 59 U/L  POCT I-STAT TROPONIN I     Status: None   Collection Time    12/18/12  9:22 PM      Result Value Range   Troponin i, poc 0.00  0.00 - 0.08 ng/mL   Comment 3           URINALYSIS, ROUTINE W REFLEX MICROSCOPIC     Status:  Abnormal   Collection Time    12/18/12 11:45 PM      Result Value Range   Color, Urine YELLOW  YELLOW   APPearance CLOUDY (*) CLEAR   Specific Gravity, Urine 1.009  1.005 - 1.030   pH 5.5  5.0 - 8.0   Glucose, UA NEGATIVE  NEGATIVE mg/dL   Hgb urine dipstick MODERATE (*) NEGATIVE   Bilirubin Urine NEGATIVE  NEGATIVE   Ketones, ur NEGATIVE  NEGATIVE mg/dL   Protein, ur NEGATIVE  NEGATIVE mg/dL   Urobilinogen, UA 0.2  0.0 - 1.0 mg/dL   Nitrite NEGATIVE  NEGATIVE   Leukocytes, UA LARGE (*) NEGATIVE  URINE MICROSCOPIC-ADD ON     Status: Abnormal   Collection Time    12/18/12 11:45 PM      Result Value Range   Squamous Epithelial / LPF FEW (*) RARE   WBC, UA 3-6  <3 WBC/hpf   RBC / HPF 0-2  <3 RBC/hpf   Bacteria, UA RARE  RARE  CG4 I-STAT (LACTIC ACID)     Status: None   Collection Time    12/19/12 12:36 AM      Result Value Range   Lactic Acid, Venous 1.05  0.5 - 2.2 mmol/L    Imaging Studies: Dg Chest 1 View  12/18/2012   *RADIOLOGY REPORT*  Clinical Data: Chest pain.  CHEST - 1 VIEW  Comparison: No priors.  Findings: Lung volumes are normal.  No consolidative airspace disease.  No pleural effusions.  No pneumothorax.  No pulmonary nodule or mass noted.  Pulmonary vasculature and the cardiomediastinal silhouette are within normal limits. Atherosclerosis in the thoracic aorta.  IMPRESSION: 1. No radiographic evidence of acute cardiopulmonary disease. 2.  Atherosclerosis.  Clinically significant discrepancy from primary report, if provided: None   Original Report Authenticated By: Trudie Reed, M.D.   Ct Abdomen Pelvis W Contrast  12/19/2012   *RADIOLOGY REPORT*  Clinical Data: Abdominal pain and diarrhea.  CT ABDOMEN AND PELVIS WITH CONTRAST  Technique:  Multidetector CT imaging of the abdomen and pelvis was performed following the standard protocol during bolus administration of intravenous contrast.  Contrast: OMNIPAQUE IOHEXOL 300 MG/ML  SOLN  Comparison: CT of the abdomen  and pelvis 03/04/2012.  Findings:  Lung Bases: Atherosclerotic calcifications in the left circumflex coronary artery.  Abdomen/Pelvis:  The appearance of the liver, gallbladder, pancreas, spleen, bilateral adrenal glands and the left kidney is unremarkable.  The in the lateral aspect of the interpolar region of the right kidney there is a 10 x 6 mm lesion that measures 58 HU on the early phase postcontrast images and 42 HU on the more delayed phase postcontrast images which is incompletely characterized.  Normal appendix.  No significant volume of ascites. No pneumoperitoneum.  No pathologic distension of small bowel.  No definite pathologic lymphadenopathy identified within the abdomen or pelvis on today's examination.  Mild atherosclerosis in the abdominal and pelvic vasculature, without evidence of aneurysm. There are a few areas  of mild colonic wall thickening, best demonstrated in the hepatic and splenic flexures.  Status post hysterectomy.  Ovaries are not confidently identified may be surgically absent or atrophic.  Urinary bladder is normal in appearance.  Musculoskeletal: There are no aggressive appearing lytic or blastic lesions noted in the visualized portions of the skeleton.  IMPRESSION: 1.  Mild multifocal colonic wall thickening is nonspecific, but may suggest mild colitis. 2.  Indeterminate 10 x 6 mm lesion in the lateral aspect of the interpolar region of the right kidney.  Given the small size of this lesion, accurate measurement of attenuation values is limited, and given the stability in size of this lesion compared to prior studies dating back to 2011, this is favored to represent a benign lesion such as a small cyst.  This could be more definitively characterized with follow-up non emergent MRI of the abdomen with and without Gadolinium if there is any clinical concern for neoplasm. 3.  Normal appendix. 4.  Additional incidental findings, as above.   Original Report Authenticated By: Trudie Reed, M.D.   Dg Abd 2 Views  12/18/2012   *RADIOLOGY REPORT*  Clinical Data: Abdominal pain.  ABDOMEN - 2 VIEW  Comparison: No priors.  Findings: Gas and stool are noted throughout the colon extending to the level of the distal rectum.  There are a few dilated loops of small bowel in the central abdomen measuring up to 3.6 cm in diameter.  Multiple calcifications are seen projecting over the central abdomen and pelvis.  No gross evidence of pneumoperitoneum.  IMPRESSION: 1.  Nonspecific bowel gas pattern, as above.  Early or partial small bowel obstruction is not excluded.  Clinical correlation is recommended.  Clinically significant discrepancy from primary report, if provided: None   Original Report Authenticated By: Trudie Reed, M.D.    Date: 12/19/2012 9:01 PM  Rate: 72  Rhythm: normal sinus rhythm  QRS Axis: normal  Intervals: normal  ST/T Wave abnormalities: normal  Conduction Disutrbances: none  Narrative Interpretation: unremarkable  Comparison with previous EKG: none available  12:13 AM The patient's records indicate an IV contrast allergy the patient herself denies this. She states she is allergic to an unspecified narcotic as well as all seafood. A review of allergy notes from past visits indicates the contrast media allergy is due to her seafood allergy. As a precaution we will premedicate with Solu-Medrol and Benadryl are up to obtain a CT scan of her abdomen.             Hanley Seamen, MD 12/19/12 [phone]

## 2012-12-19 ENCOUNTER — Encounter (HOSPITAL_COMMUNITY): Payer: Self-pay | Admitting: Radiology

## 2012-12-19 ENCOUNTER — Emergency Department (HOSPITAL_COMMUNITY): Payer: Medicare Other

## 2012-12-19 DIAGNOSIS — J45909 Unspecified asthma, uncomplicated: Secondary | ICD-10-CM

## 2012-12-19 DIAGNOSIS — K5289 Other specified noninfective gastroenteritis and colitis: Secondary | ICD-10-CM

## 2012-12-19 DIAGNOSIS — E785 Hyperlipidemia, unspecified: Secondary | ICD-10-CM | POA: Diagnosis present

## 2012-12-19 DIAGNOSIS — K529 Noninfective gastroenteritis and colitis, unspecified: Secondary | ICD-10-CM | POA: Diagnosis present

## 2012-12-19 LAB — CBC WITH DIFFERENTIAL/PLATELET
Basophils Absolute: 0 10*3/uL (ref 0.0–0.1)
Basophils Relative: 0 % (ref 0–1)
Eosinophils Relative: 0 % (ref 0–5)
HCT: 35.3 % — ABNORMAL LOW (ref 36.0–46.0)
Lymphocytes Relative: 7 % — ABNORMAL LOW (ref 12–46)
Lymphs Abs: 0.8 10*3/uL (ref 0.7–4.0)
MCHC: 32.6 g/dL (ref 30.0–36.0)
MCV: 78.4 fL (ref 78.0–100.0)
Monocytes Absolute: 0.1 10*3/uL (ref 0.1–1.0)
RBC: 4.5 MIL/uL (ref 3.87–5.11)
RDW: 13.3 % (ref 11.5–15.5)
WBC: 11.5 10*3/uL — ABNORMAL HIGH (ref 4.0–10.5)

## 2012-12-19 LAB — URINALYSIS, ROUTINE W REFLEX MICROSCOPIC
Glucose, UA: NEGATIVE mg/dL
Protein, ur: NEGATIVE mg/dL
Specific Gravity, Urine: 1.009 (ref 1.005–1.030)
pH: 5.5 (ref 5.0–8.0)

## 2012-12-19 LAB — LACTIC ACID, PLASMA: Lactic Acid, Venous: 0.8 mmol/L (ref 0.5–2.2)

## 2012-12-19 LAB — COMPREHENSIVE METABOLIC PANEL
ALT: 20 U/L (ref 0–35)
AST: 17 U/L (ref 0–37)
Albumin: 2.7 g/dL — ABNORMAL LOW (ref 3.5–5.2)
Alkaline Phosphatase: 63 U/L (ref 39–117)
BUN: 7 mg/dL (ref 6–23)
Potassium: 3.9 mEq/L (ref 3.5–5.1)
Sodium: 136 mEq/L (ref 135–145)
Total Protein: 6.7 g/dL (ref 6.0–8.3)

## 2012-12-19 LAB — URINE MICROSCOPIC-ADD ON

## 2012-12-19 LAB — CG4 I-STAT (LACTIC ACID): Lactic Acid, Venous: 1.05 mmol/L (ref 0.5–2.2)

## 2012-12-19 MED ORDER — FENTANYL CITRATE 0.05 MG/ML IJ SOLN
50.0000 ug | INTRAMUSCULAR | Status: DC | PRN
  Administered 2012-12-19: 50 ug via INTRAVENOUS
  Filled 2012-12-19: qty 2

## 2012-12-19 MED ORDER — IOHEXOL 300 MG/ML  SOLN
100.0000 mL | Freq: Once | INTRAMUSCULAR | Status: AC | PRN
  Administered 2012-12-19: 100 mL via INTRAVENOUS

## 2012-12-19 MED ORDER — ALBUTEROL SULFATE HFA 108 (90 BASE) MCG/ACT IN AERS: 2.0000 | INHALATION_SPRAY | Freq: Two times a day (BID) | RESPIRATORY_TRACT | Status: DC

## 2012-12-19 MED ORDER — ENOXAPARIN SODIUM 40 MG/0.4ML ~~LOC~~ SOLN
40.0000 mg | SUBCUTANEOUS | Status: DC
  Administered 2012-12-19 – 2012-12-20 (×2): 40 mg via SUBCUTANEOUS
  Filled 2012-12-19 (×2): qty 0.4

## 2012-12-19 MED ORDER — ACETAMINOPHEN 650 MG RE SUPP: 650.0000 mg | Freq: Four times a day (QID) | RECTAL | Status: DC | PRN

## 2012-12-19 MED ORDER — IOHEXOL 300 MG/ML  SOLN
25.0000 mL | INTRAMUSCULAR | Status: AC
  Administered 2012-12-19: 25 mL via ORAL

## 2012-12-19 MED ORDER — GUAIFENESIN ER 600 MG PO TB12
600.0000 mg | ORAL_TABLET | Freq: Two times a day (BID) | ORAL | Status: DC
  Administered 2012-12-19 – 2012-12-22 (×6): 600 mg via ORAL
  Filled 2012-12-19 (×8): qty 1

## 2012-12-19 MED ORDER — PANTOPRAZOLE SODIUM 40 MG PO TBEC
40.0000 mg | DELAYED_RELEASE_TABLET | Freq: Every day | ORAL | Status: DC
  Administered 2012-12-19: 40 mg via ORAL
  Filled 2012-12-19: qty 1

## 2012-12-19 MED ORDER — DIPHENHYDRAMINE HCL 50 MG/ML IJ SOLN
50.0000 mg | Freq: Once | INTRAMUSCULAR | Status: AC
  Administered 2012-12-19: 50 mg via INTRAVENOUS
  Filled 2012-12-19: qty 1

## 2012-12-19 MED ORDER — METRONIDAZOLE IN NACL 5-0.79 MG/ML-% IV SOLN
500.0000 mg | Freq: Once | INTRAVENOUS | Status: AC
  Administered 2012-12-19: 500 mg via INTRAVENOUS
  Filled 2012-12-19: qty 100

## 2012-12-19 MED ORDER — SODIUM CHLORIDE 0.9 % IV SOLN: INTRAVENOUS | Status: AC

## 2012-12-19 MED ORDER — ONDANSETRON HCL 4 MG/2ML IJ SOLN
4.0000 mg | Freq: Once | INTRAMUSCULAR | Status: AC
  Administered 2012-12-19: 4 mg via INTRAVENOUS
  Filled 2012-12-19: qty 2

## 2012-12-19 MED ORDER — SODIUM CHLORIDE 0.9 % IV BOLUS (SEPSIS)
1000.0000 mL | Freq: Once | INTRAVENOUS | Status: AC
  Administered 2012-12-19: 1000 mL via INTRAVENOUS

## 2012-12-19 MED ORDER — SODIUM CHLORIDE 0.9 % IV SOLN
INTRAVENOUS | Status: AC
  Administered 2012-12-19: 04:00:00 via INTRAVENOUS

## 2012-12-19 MED ORDER — FENTANYL CITRATE 0.05 MG/ML IJ SOLN
50.0000 ug | Freq: Once | INTRAMUSCULAR | Status: AC
  Administered 2012-12-19: 50 ug via INTRAVENOUS
  Filled 2012-12-19: qty 2

## 2012-12-19 MED ORDER — FAMOTIDINE 40 MG PO TABS
40.0000 mg | ORAL_TABLET | Freq: Every day | ORAL | Status: DC
  Administered 2012-12-19 – 2012-12-22 (×4): 40 mg via ORAL
  Filled 2012-12-19 (×4): qty 1

## 2012-12-19 MED ORDER — ONDANSETRON HCL 4 MG/2ML IJ SOLN
4.0000 mg | Freq: Four times a day (QID) | INTRAMUSCULAR | Status: DC | PRN
  Filled 2012-12-19: qty 2

## 2012-12-19 MED ORDER — ACETAMINOPHEN 325 MG PO TABS
650.0000 mg | ORAL_TABLET | Freq: Four times a day (QID) | ORAL | Status: DC | PRN
  Administered 2012-12-21: 650 mg via ORAL
  Filled 2012-12-19: qty 2

## 2012-12-19 MED ORDER — BUDESONIDE-FORMOTEROL FUMARATE 160-4.5 MCG/ACT IN AERO
2.0000 | INHALATION_SPRAY | Freq: Every day | RESPIRATORY_TRACT | Status: DC
  Filled 2012-12-19: qty 6

## 2012-12-19 MED ORDER — ONDANSETRON HCL 4 MG PO TABS: 4.0000 mg | ORAL_TABLET | Freq: Four times a day (QID) | ORAL | Status: DC | PRN

## 2012-12-19 MED ORDER — CIPROFLOXACIN IN D5W 400 MG/200ML IV SOLN
400.0000 mg | Freq: Two times a day (BID) | INTRAVENOUS | Status: DC
  Filled 2012-12-19: qty 200

## 2012-12-19 MED ORDER — MORPHINE SULFATE 2 MG/ML IJ SOLN
1.0000 mg | INTRAMUSCULAR | Status: DC | PRN
  Administered 2012-12-19 – 2012-12-21 (×3): 1 mg via INTRAVENOUS
  Filled 2012-12-19 (×3): qty 1

## 2012-12-19 MED ORDER — ALBUTEROL SULFATE (5 MG/ML) 0.5% IN NEBU: 2.5000 mg | INHALATION_SOLUTION | RESPIRATORY_TRACT | Status: DC | PRN

## 2012-12-19 MED ORDER — BISMUTH SUBSALICYLATE 262 MG/15ML PO SUSP
30.0000 mL | Freq: Four times a day (QID) | ORAL | Status: DC | PRN
  Filled 2012-12-19: qty 236

## 2012-12-19 MED ORDER — METHYLPREDNISOLONE SODIUM SUCC 125 MG IJ SOLR
125.0000 mg | Freq: Once | INTRAMUSCULAR | Status: AC
  Administered 2012-12-19: 125 mg via INTRAVENOUS
  Filled 2012-12-19: qty 2

## 2012-12-19 MED ORDER — METRONIDAZOLE IN NACL 5-0.79 MG/ML-% IV SOLN
500.0000 mg | Freq: Three times a day (TID) | INTRAVENOUS | Status: DC
  Administered 2012-12-19 – 2012-12-20 (×3): 500 mg via INTRAVENOUS
  Filled 2012-12-19 (×5): qty 100

## 2012-12-19 MED ORDER — SIMVASTATIN 10 MG PO TABS
10.0000 mg | ORAL_TABLET | ORAL | Status: DC
  Administered 2012-12-20: 10 mg via ORAL
  Filled 2012-12-19 (×2): qty 1

## 2012-12-19 MED ORDER — CIPROFLOXACIN IN D5W 400 MG/200ML IV SOLN
400.0000 mg | Freq: Once | INTRAVENOUS | Status: AC
  Administered 2012-12-19: 400 mg via INTRAVENOUS
  Filled 2012-12-19: qty 200

## 2012-12-19 NOTE — H&P (Signed)
[facility] History and Physical  Samantha [name] ZOX:[id number] DOB: 1940/09/26 DOA: 12/18/2012  Referring physician: ER physician. PCP: [name], MD   Chief Complaint: Abdominal pain.  HPI: Samantha [name] is a 72 y.o. female no history of asthma and hyperlipidemia who has had recent asthma exacerbation and was on antibiotics and steroids started experiencing abdominal pain with diarrhea last 2 days. The pain is diffuse and had associated diarrhea with no vomiting. Denies any fever chills. Patient had gone to the urgent care and was referred to the ER. CT abdomen pelvis done shows possible colitis and patient has been admitted for further management. Denies any chest pain or shortness of breath this time.  Review of Systems: As presented in the history of presenting illness, rest negative.  Past Medical History  Diagnosis Date  . UTI (lower urinary tract infection)   . Asthma   . GERD (gastroesophageal reflux disease)    Past Surgical History  Procedure Laterality Date  . Abdominal surgery    . Abdominal hysterectomy     Social History:  reports that she has never smoked. She does not have any smokeless tobacco history on file. She reports that she does not drink alcohol or use illicit drugs. Home. where does patient live-- Can do ADLs. Can patient participate in ADLs?  Allergies  Allergen Reactions  . Contrast Media (Iodinated Diagnostic Agents) Other (See Comments)    Unknown   . Other Nausea And Vomiting    Seafood allergy.     History reviewed. No pertinent family history.    Prior to Admission medications   Medication Sig Start Date End Date Taking? Authorizing Provider  albuterol (PROVENTIL HFA;VENTOLIN HFA) 108 (90 BASE) MCG/ACT inhaler Inhale 2 puffs into the lungs 2 (two) times daily. For shortness of breath   Yes Historical Provider, MD  amoxicillin-clavulanate (AUGMENTIN) 600-42.9 MG/5ML suspension Take 900 mg by mouth 2 (two) times daily. For 10 days, started  6.10.14   Yes Historical Provider, MD  bismuth subsalicylate (PEPTO BISMOL) 262 MG/15ML suspension Take 30 mLs by mouth every 6 (six) hours as needed for indigestion.   Yes Historical Provider, MD  budesonide-formoterol (SYMBICORT) 160-4.5 MCG/ACT inhaler Inhale 2 puffs into the lungs daily.    Yes Historical Provider, MD  Ergocalciferol (VITAMIN D2 PO) Take 1.25 mg by mouth 2 (two) times a week. Take every Tuesday and Friday   Yes Historical Provider, MD  esomeprazole (NEXIUM) 40 MG capsule Take 40 mg by mouth 2 (two) times a week. Takes on Wednesday and Friday.   Yes Historical Provider, MD  hyoscyamine (LEVSIN SL) 0.125 MG SL tablet Place 0.125 mg under the tongue 2 (two) times daily as needed. For bladder spasms   Yes Historical Provider, MD  predniSONE (DELTASONE) 20 MG tablet Take 2 tablets (40 mg total) by mouth daily. 12/09/12  Yes Raeford Razor, MD  simvastatin (ZOCOR) 10 MG tablet Take 10 mg by mouth 2 (two) times a week. Take one tablet on Mondays and Wednesdays   Yes Historical Provider, MD   Physical Exam: Filed Vitals:   12/18/12 2104 12/18/12 2315 12/19/12 0015 12/19/12 0115  BP: 104/64 117/70 106/68 113/53  Pulse: 72 62 61 63  Temp: 97.8 F (36.6 C)     TempSrc: Oral     Resp: 16 22 18 16   Height: 4\' 11"  (1.499 m)     Weight: 49.896 kg (110 lb)     SpO2: 99% 99% 100% 100%     General:  Well-developed well-nourished.  Eyes: Anicteric no pallor.  ENT: No discharge from the ears eyes nose mouth.  Neck: No mass felt.  Cardiovascular: S1-S2 heard.  Respiratory: Mild expiratory wheeze no crepitations.  Abdomen: Soft mild tenderness diffusely. Bowel sounds present. No guarding rigidity.  Skin: No rash.  Musculoskeletal: No edema.  Psychiatric: Appears normal.  Neurologic: Alert awake oriented to time place and person. Moves all extremities.  Labs on Admission:  Basic Metabolic Panel:  Recent Labs Lab 12/18/12 2120  NA 134*  K 3.5  CL 98  CO2 27  GLUCOSE  104*  BUN 6  CREATININE 0.54  CALCIUM 8.9   Liver Function Tests:  Recent Labs Lab 12/18/12 2120  AST 23  ALT 24  ALKPHOS 75  BILITOT 0.5  PROT 7.6  ALBUMIN 3.3*    Recent Labs Lab 12/18/12 2120  LIPASE 11   No results found for this basename: AMMONIA,  in the last 168 hours CBC:  Recent Labs Lab 12/18/12 2017 12/18/12 2120  WBC 14.0* 16.2*  NEUTROABS  --  11.9*  HGB 11.1* 13.3  HCT 36.6* 38.7  MCV 84.0 76.9*  PLT  --  241   Cardiac Enzymes: No results found for this basename: CKTOTAL, CKMB, CKMBINDEX, TROPONINI,  in the last 168 hours  BNP (last 3 results) No results found for this basename: PROBNP,  in the last 8760 hours CBG: No results found for this basename: GLUCAP,  in the last 168 hours  Radiological Exams on Admission: Dg Chest 1 View  12/18/2012   *RADIOLOGY REPORT*  Clinical Data: Chest pain.  CHEST - 1 VIEW  Comparison: No priors.  Findings: Lung volumes are normal.  No consolidative airspace disease.  No pleural effusions.  No pneumothorax.  No pulmonary nodule or mass noted.  Pulmonary vasculature and the cardiomediastinal silhouette are within normal limits. Atherosclerosis in the thoracic aorta.  IMPRESSION: 1. No radiographic evidence of acute cardiopulmonary disease. 2.  Atherosclerosis.  Clinically significant discrepancy from primary report, if provided: None   Original Report Authenticated By: Trudie Reed, M.D.   Ct Abdomen Pelvis W Contrast  12/19/2012   *RADIOLOGY REPORT*  Clinical Data: Abdominal pain and diarrhea.  CT ABDOMEN AND PELVIS WITH CONTRAST  Technique:  Multidetector CT imaging of the abdomen and pelvis was performed following the standard protocol during bolus administration of intravenous contrast.  Contrast: OMNIPAQUE IOHEXOL 300 MG/ML  SOLN  Comparison: CT of the abdomen and pelvis 03/04/2012.  Findings:  Lung Bases: Atherosclerotic calcifications in the left circumflex coronary artery.  Abdomen/Pelvis:  The appearance  of the liver, gallbladder, pancreas, spleen, bilateral adrenal glands and the left kidney is unremarkable.  The in the lateral aspect of the interpolar region of the right kidney there is a 10 x 6 mm lesion that measures 58 HU on the early phase postcontrast images and 42 HU on the more delayed phase postcontrast images which is incompletely characterized.  Normal appendix.  No significant volume of ascites. No pneumoperitoneum.  No pathologic distension of small bowel.  No definite pathologic lymphadenopathy identified within the abdomen or pelvis on today's examination.  Mild atherosclerosis in the abdominal and pelvic vasculature, without evidence of aneurysm. There are a few areas of mild colonic wall thickening, best demonstrated in the hepatic and splenic flexures.  Status post hysterectomy.  Ovaries are not confidently identified may be surgically absent or atrophic.  Urinary bladder is normal in appearance.  Musculoskeletal: There are no aggressive appearing lytic or blastic lesions noted in the  visualized portions of the skeleton.  IMPRESSION: 1.  Mild multifocal colonic wall thickening is nonspecific, but may suggest mild colitis. 2.  Indeterminate 10 x 6 mm lesion in the lateral aspect of the interpolar region of the right kidney.  Given the small size of this lesion, accurate measurement of attenuation values is limited, and given the stability in size of this lesion compared to prior studies dating back to 2011, this is favored to represent a benign lesion such as a small cyst.  This could be more definitively characterized with follow-up non emergent MRI of the abdomen with and without Gadolinium if there is any clinical concern for neoplasm. 3.  Normal appendix. 4.  Additional incidental findings, as above.   Original Report Authenticated By: Trudie Reed, M.D.   Dg Abd 2 Views  12/18/2012   *RADIOLOGY REPORT*  Clinical Data: Abdominal pain.  ABDOMEN - 2 VIEW  Comparison: No priors.  Findings:  Gas and stool are noted throughout the colon extending to the level of the distal rectum.  There are a few dilated loops of small bowel in the central abdomen measuring up to 3.6 cm in diameter.  Multiple calcifications are seen projecting over the central abdomen and pelvis.  No gross evidence of pneumoperitoneum.  IMPRESSION: 1.  Nonspecific bowel gas pattern, as above.  Early or partial small bowel obstruction is not excluded.  Clinical correlation is recommended.  Clinically significant discrepancy from primary report, if provided: None   Original Report Authenticated By: Trudie Reed, M.D.     Assessment/Plan Principal Problem:   Colitis Active Problems:   ASTHMA   Dyslipidemia   1. Abdominal pain most likely secondary to colitis - since patient has had recent antibiotic use most likely this could be C. difficile colitis. For now patient has been placed on Cipro and Flagyl. Check stool for C. difficile and cultures. Gently hydrate. Check lactic acid levels. 2. History of asthma - continue inhalers and when necessary albuterol. 3. Anemia - closely follow CBC and if there is no significant fall in hemoglobin then further workup as outpatient through primary care physician. 4. Leukocytosis probably secondary to #1 reason - closely follow CBC. 5. Right renal lesion - further workup through primary care.    Code Status: Full code.  Family Communication: Patient's daughter.  Disposition Plan: Admit to inpatient.    KAKRAKANDY,ARSHAD N. [facility] Pager [phone].  If 7PM-7AM, please contact night-coverage [email or web address] Password [facility] 12/19/2012, 4:36 AM

## 2012-12-19 NOTE — ED Notes (Signed)
Dr. Kirtland Bouchard in to talk with family

## 2012-12-19 NOTE — Progress Notes (Signed)
UR COMPLETED  

## 2012-12-19 NOTE — Progress Notes (Signed)
Patient seen and examined. Complaining of abdominal pain and nausea/vomiting. C. Diff positive. Will direct therapy towards c. Diff colitis. Please referred to Dr. Toniann Fail H&P for further admission details/info and plan of care.  Samantha [name] [phone]

## 2012-12-19 NOTE — ED Notes (Signed)
[phone] Sol Passer, (daughter)- call with update

## 2012-12-19 NOTE — Progress Notes (Signed)
Pt transferred from the ED, admitted to Rm [id number]. Pt comes from home. She is alert and oriented. Ambulatory with standby assist. No skin breakdown noted. Oriented to room, instructed to call for assistance before getting out of bed. Bed alarm on, resting comfortably at this time, will continue to monitor

## 2012-12-20 DIAGNOSIS — K219 Gastro-esophageal reflux disease without esophagitis: Secondary | ICD-10-CM

## 2012-12-20 DIAGNOSIS — A0472 Enterocolitis due to Clostridium difficile, not specified as recurrent: Principal | ICD-10-CM

## 2012-12-20 MED ORDER — NYSTATIN 100000 UNIT/ML MT SUSP
5.0000 mL | Freq: Four times a day (QID) | OROMUCOSAL | Status: DC
  Administered 2012-12-20 – 2012-12-22 (×6): 500000 [IU] via ORAL
  Filled 2012-12-20 (×10): qty 5

## 2012-12-20 MED ORDER — ALBUTEROL SULFATE HFA 108 (90 BASE) MCG/ACT IN AERS: 2.0000 | INHALATION_SPRAY | Freq: Four times a day (QID) | RESPIRATORY_TRACT | Status: DC | PRN

## 2012-12-20 MED ORDER — METRONIDAZOLE 500 MG PO TABS
500.0000 mg | ORAL_TABLET | Freq: Three times a day (TID) | ORAL | Status: DC
  Administered 2012-12-20 – 2012-12-22 (×7): 500 mg via ORAL
  Filled 2012-12-20 (×9): qty 1

## 2012-12-20 NOTE — Progress Notes (Addendum)
[facility] PROGRESS NOTE  Samantha [name] ZOX:[id number] DOB: 1940-10-05 DOA: 12/18/2012 PCP: [name], MD  Assessment/Plan: 1-C. Diff colitis: still with ongoing diarrhea and abdominal discomfort. But overall feeling better. -continue treatment with flagyl -will advance diet and change abx's to PO -replete electrolytes as needed -avoid PPI and encourage the use of dairy products.  2-asthma: stable. No wheezing  3-Leukocytosis:due to # 1. Continue abx's. CBC in am  4-GERD: will use pepcid  5-HLD: continue statins  6-thrush: will treat with nystatin  WJX:BJYNWGNF lovenox.  Code Status: Full Family Communication: granddaughter and sister at bedside Disposition Plan: home when medically stable   Consultants:  none  Procedures:  See below for x-ray reports   Antibiotics:  flagyl  HPI/Subjective: Afebrile, no CP, no SOB. Still feeling sick on her stomach, no further vomiting and with ongoing diarrhea. Will like to advance diet.  Objective: Filed Vitals:   12/19/12 2043 12/20/12 0450 12/20/12 1017 12/20/12 1512  BP: 97/57 92/54 129/73 126/69  Pulse: 49 48 52 47  Temp: 97.8 F (36.6 C) 97.6 F (36.4 C) 97.8 F (36.6 C) 97.6 F (36.4 C)  TempSrc: Oral Oral Oral Oral  Resp: 16 19 18 18   Height: 4' 11.5" (1.511 m)     Weight: 48.3 kg (106 lb 7.7 oz)     SpO2: 96% 95% 100% 100%    Intake/Output Summary (Last 24 hours) at 12/20/12 1636 Last data filed at 12/20/12 1130  Gross per 24 hour  Intake 1381.25 ml  Output      0 ml  Net 1381.25 ml   Filed Weights   12/18/12 2104 12/19/12 0519 12/19/12 2043  Weight: 49.896 kg (110 lb) 47.4 kg (104 lb 8 oz) 48.3 kg (106 lb 7.7 oz)    Exam:   General:  NAD, afebrile, still feeling sick on her stomach and with diarrhea   Cardiovascular: S1 and S2, no rubs or gallops  Respiratory: scattered rhonchi, otherwise clear and w/o wheezing  Abdomen: soft, ND, positive BS, tender to palpation mid abd and  LLQ  Musculoskeletal: no erythema, no edema, no cyanosis  Data Reviewed: Basic Metabolic Panel:  Recent Labs Lab 12/18/12 2120 12/19/12 0600  NA 134* 136  K 3.5 3.9  CL 98 103  CO2 27 27  GLUCOSE 104* 178*  BUN 6 7  CREATININE 0.54 0.46*  CALCIUM 8.9 8.2*   Liver Function Tests:  Recent Labs Lab 12/18/12 2120 12/19/12 0600  AST 23 17  ALT 24 20  ALKPHOS 75 63  BILITOT 0.5 0.3  PROT 7.6 6.7  ALBUMIN 3.3* 2.7*    Recent Labs Lab 12/18/12 2120  LIPASE 11   CBC:  Recent Labs Lab 12/18/12 2017 12/18/12 2120 12/19/12 0600  WBC 14.0* 16.2* 11.5*  NEUTROABS  --  11.9* 10.6*  HGB 11.1* 13.3 11.5*  HCT 36.6* 38.7 35.3*  MCV 84.0 76.9* 78.4  PLT  --  241 207    Recent Results (from the past 240 hour(s))  CLOSTRIDIUM DIFFICILE BY PCR     Status: Abnormal   Collection Time    12/19/12  9:58 AM      Result Value Range Status   C difficile by pcr POSITIVE (*) NEGATIVE Final   Comment: CRITICAL RESULT CALLED TO, READ BACK BY AND VERIFIED WITH:     SCHAUER C RN 12/19/12 1141 COSTELLO B     Studies: Dg Chest 1 View  12/18/2012   *RADIOLOGY REPORT*  Clinical Data: Chest pain.  CHEST -  1 VIEW  Comparison: No priors.  Findings: Lung volumes are normal.  No consolidative airspace disease.  No pleural effusions.  No pneumothorax.  No pulmonary nodule or mass noted.  Pulmonary vasculature and the cardiomediastinal silhouette are within normal limits. Atherosclerosis in the thoracic aorta.  IMPRESSION: 1. No radiographic evidence of acute cardiopulmonary disease. 2.  Atherosclerosis.  Clinically significant discrepancy from primary report, if provided: None   Original Report Authenticated By: Trudie Reed, M.D.   Ct Abdomen Pelvis W Contrast  12/19/2012   *RADIOLOGY REPORT*  Clinical Data: Abdominal pain and diarrhea.  CT ABDOMEN AND PELVIS WITH CONTRAST  Technique:  Multidetector CT imaging of the abdomen and pelvis was performed following the standard protocol during  bolus administration of intravenous contrast.  Contrast: OMNIPAQUE IOHEXOL 300 MG/ML  SOLN  Comparison: CT of the abdomen and pelvis 03/04/2012.  Findings:  Lung Bases: Atherosclerotic calcifications in the left circumflex coronary artery.  Abdomen/Pelvis:  The appearance of the liver, gallbladder, pancreas, spleen, bilateral adrenal glands and the left kidney is unremarkable.  The in the lateral aspect of the interpolar region of the right kidney there is a 10 x 6 mm lesion that measures 58 HU on the early phase postcontrast images and 42 HU on the more delayed phase postcontrast images which is incompletely characterized.  Normal appendix.  No significant volume of ascites. No pneumoperitoneum.  No pathologic distension of small bowel.  No definite pathologic lymphadenopathy identified within the abdomen or pelvis on today's examination.  Mild atherosclerosis in the abdominal and pelvic vasculature, without evidence of aneurysm. There are a few areas of mild colonic wall thickening, best demonstrated in the hepatic and splenic flexures.  Status post hysterectomy.  Ovaries are not confidently identified may be surgically absent or atrophic.  Urinary bladder is normal in appearance.  Musculoskeletal: There are no aggressive appearing lytic or blastic lesions noted in the visualized portions of the skeleton.  IMPRESSION: 1.  Mild multifocal colonic wall thickening is nonspecific, but may suggest mild colitis. 2.  Indeterminate 10 x 6 mm lesion in the lateral aspect of the interpolar region of the right kidney.  Given the small size of this lesion, accurate measurement of attenuation values is limited, and given the stability in size of this lesion compared to prior studies dating back to 2011, this is favored to represent a benign lesion such as a small cyst.  This could be more definitively characterized with follow-up non emergent MRI of the abdomen with and without Gadolinium if there is any clinical concern  for neoplasm. 3.  Normal appendix. 4.  Additional incidental findings, as above.   Original Report Authenticated By: Trudie Reed, M.D.   Dg Abd 2 Views  12/18/2012   *RADIOLOGY REPORT*  Clinical Data: Abdominal pain.  ABDOMEN - 2 VIEW  Comparison: No priors.  Findings: Gas and stool are noted throughout the colon extending to the level of the distal rectum.  There are a few dilated loops of small bowel in the central abdomen measuring up to 3.6 cm in diameter.  Multiple calcifications are seen projecting over the central abdomen and pelvis.  No gross evidence of pneumoperitoneum.  IMPRESSION: 1.  Nonspecific bowel gas pattern, as above.  Early or partial small bowel obstruction is not excluded.  Clinical correlation is recommended.  Clinically significant discrepancy from primary report, if provided: None   Original Report Authenticated By: Trudie Reed, M.D.    Scheduled Meds: . albuterol  2 puff Inhalation BID  .  budesonide-formoterol  2 puff Inhalation Daily  . enoxaparin (LOVENOX) injection  40 mg Subcutaneous Q24H  . famotidine  40 mg Oral Daily  . guaiFENesin  600 mg Oral BID  . metroNIDAZOLE  500 mg Oral Q8H  . simvastatin  10 mg Oral 2 times weekly   Continuous Infusions:   Principal Problem:   Colitis Active Problems:   ASTHMA   Dyslipidemia    Time spent: >30 minutes    Samantha [name]  [facility] Pager [phone]. If 7PM-7AM, please contact night-coverage at [email or web address], password [facility] 12/20/2012, 4:36 PM  LOS: 2 days

## 2012-12-21 LAB — BASIC METABOLIC PANEL
BUN: 5 mg/dL — ABNORMAL LOW (ref 6–23)
Calcium: 8 mg/dL — ABNORMAL LOW (ref 8.4–10.5)
Creatinine, Ser: 0.51 mg/dL (ref 0.50–1.10)
GFR calc Af Amer: >90 mL/min (ref >90)
GFR calc non Af Amer: >90 mL/min (ref >90)
Glucose, Bld: 80 mg/dL (ref 70–99)
Sodium: 140 mEq/L (ref 135–145)

## 2012-12-21 LAB — CBC
HCT: 32.9 % — ABNORMAL LOW (ref 36.0–46.0)
Hemoglobin: 11 g/dL — ABNORMAL LOW (ref 12.0–15.0)
MCH: 26.1 pg (ref 26.0–34.0)
MCHC: 33.4 g/dL (ref 30.0–36.0)

## 2012-12-21 MED ORDER — POTASSIUM CHLORIDE CRYS ER 20 MEQ PO TBCR
40.0000 meq | EXTENDED_RELEASE_TABLET | ORAL | Status: AC
  Administered 2012-12-21: 40 meq via ORAL
  Filled 2012-12-21 (×2): qty 2

## 2012-12-21 MED ORDER — SODIUM CHLORIDE 0.9 % IV SOLN
INTRAVENOUS | Status: DC
  Administered 2012-12-21: 23:00:00 via INTRAVENOUS

## 2012-12-21 MED ORDER — BUDESONIDE-FORMOTEROL FUMARATE 160-4.5 MCG/ACT IN AERO
2.0000 | INHALATION_SPRAY | Freq: Two times a day (BID) | RESPIRATORY_TRACT | Status: DC
  Administered 2012-12-22: 2 via RESPIRATORY_TRACT

## 2012-12-21 NOTE — Progress Notes (Signed)
[facility] PROGRESS NOTE  Samantha [name] ZOX:[id number] DOB: 1940/10/25 DOA: 12/18/2012 PCP: [name], MD  Assessment/Plan: 1-C. Diff colitis: still with ongoing diarrhea and abdominal discomfort. But overall feeling better. -continue treatment with flagyl -will advance diet and change abx's to PO -replete electrolytes as needed -avoid PPI and encourage the use of dairy products. -due significant diarrhea, will replete electrolytes in anticipation for hypokalemia; will check Mg level and will provide gentel hydration.  2-asthma: stable. No wheezing; continue symbicort, PRN albuterol and mucinex.  3-Leukocytosis:due to # 1. Continue abx's. CBC WNL.  4-GERD: will continue using pepcid  5-HLD: continue statins  6-thrush: will continue tx with nystatin  WJX:BJYNWGNF lovenox.  Code Status: Full Family Communication: granddaughter and sister at bedside Disposition Plan: home when medically stable   Consultants:  none  Procedures:  See below for x-ray reports   Antibiotics:  flagyl  HPI/Subjective: Afebrile, no CP, no SOB. Feeling weak and even is slowly improving, had > 10 episodes of watery diarrhea since 8:am this morning.  Objective: Filed Vitals:   12/20/12 1821 12/21/12 0519 12/21/12 1017 12/21/12 1402  BP: 121/54 142/68 131/72 124/65  Pulse: 51 50 52 55  Temp: 97.6 F (36.4 C) 97.7 F (36.5 C) 97.9 F (36.6 C) 97.5 F (36.4 C)  TempSrc: Oral Oral  Oral  Resp: 18 16 15 16   Height:      Weight:      SpO2: 100% 100% 99% 99%    Intake/Output Summary (Last 24 hours) at 12/21/12 2028 Last data filed at 12/21/12 1018  Gross per 24 hour  Intake    360 ml  Output      0 ml  Net    360 ml   Filed Weights   12/18/12 2104 12/19/12 0519 12/19/12 2043  Weight: 49.896 kg (110 lb) 47.4 kg (104 lb 8 oz) 48.3 kg (106 lb 7.7 oz)    Exam:   General:  NAD, afebrile, still feeling sick on her stomach and with diarrhea (> 10 episodes  today)  Cardiovascular: S1 and S2, no rubs or gallops  Respiratory: scattered rhonchi, otherwise clear and w/o wheezing  Abdomen: soft, ND, positive BS, tender to palpation mid abd and LLQ (improving)  Musculoskeletal: no erythema, no edema, no cyanosis  Data Reviewed: Basic Metabolic Panel:  Recent Labs Lab 12/18/12 2120 12/19/12 0600 12/21/12 0530  NA 134* 136 140  K 3.5 3.9 3.6  CL 98 103 108  CO2 27 27 24   GLUCOSE 104* 178* 80  BUN 6 7 5*  CREATININE 0.54 0.46* 0.51  CALCIUM 8.9 8.2* 8.0*   Liver Function Tests:  Recent Labs Lab 12/18/12 2120 12/19/12 0600  AST 23 17  ALT 24 20  ALKPHOS 75 63  BILITOT 0.5 0.3  PROT 7.6 6.7  ALBUMIN 3.3* 2.7*    Recent Labs Lab 12/18/12 2120  LIPASE 11   CBC:  Recent Labs Lab 12/18/12 2017 12/18/12 2120 12/19/12 0600 12/21/12 0530  WBC 14.0* 16.2* 11.5* 6.8  NEUTROABS  --  11.9* 10.6*  --   HGB 11.1* 13.3 11.5* 11.0*  HCT 36.6* 38.7 35.3* 32.9*  MCV 84.0 76.9* 78.4 78.0  PLT  --  241 207 214    Recent Results (from the past 240 hour(s))  CLOSTRIDIUM DIFFICILE BY PCR     Status: Abnormal   Collection Time    12/19/12  9:58 AM      Result Value Range Status   C difficile by pcr POSITIVE (*) NEGATIVE  Final   Comment: CRITICAL RESULT CALLED TO, READ BACK BY AND VERIFIED WITH:     SCHAUER C RN 12/19/12 1141 COSTELLO B  STOOL CULTURE     Status: None   Collection Time    12/19/12  9:58 AM      Result Value Range Status   Specimen Description STOOL   Final   Special Requests NONE   Final   Culture NO SUSPICIOUS COLONIES, CONTINUING TO HOLD   Final   Report Status PENDING   Incomplete     Studies: No results found.  Scheduled Meds: . [START ON 12/22/2012] budesonide-formoterol  2 puff Inhalation BID  . famotidine  40 mg Oral Daily  . guaiFENesin  600 mg Oral BID  . metroNIDAZOLE  500 mg Oral Q8H  . nystatin  5 mL Oral QID  . potassium chloride  40 mEq Oral Q4H  . simvastatin  10 mg Oral 2 times weekly    Continuous Infusions:   Principal Problem:   Colitis Active Problems:   ASTHMA   Dyslipidemia    Time spent: >30 minutes    Broox Lonigro  [facility] Pager [phone]. If 7PM-7AM, please contact night-coverage at [email or web address], password [facility] 12/21/2012, 8:28 PM  LOS: 3 days

## 2012-12-22 LAB — BASIC METABOLIC PANEL
Calcium: 8 mg/dL — ABNORMAL LOW (ref 8.4–10.5)
Chloride: 105 mEq/L (ref 96–112)
GFR calc Af Amer: >90 mL/min (ref >90)
Potassium: 3.9 mEq/L (ref 3.5–5.1)

## 2012-12-22 LAB — MAGNESIUM: Magnesium: 1.9 mg/dL (ref 1.5–2.5)

## 2012-12-22 MED ORDER — ALBUTEROL SULFATE HFA 108 (90 BASE) MCG/ACT IN AERS: 2.0000 | INHALATION_SPRAY | Freq: Four times a day (QID) | RESPIRATORY_TRACT | Status: DC | PRN

## 2012-12-22 MED ORDER — NYSTATIN 100000 UNIT/ML MT SUSP: 5.0000 mL | Freq: Four times a day (QID) | OROMUCOSAL | Status: DC

## 2012-12-22 MED ORDER — METRONIDAZOLE 500 MG PO TABS: 500.0000 mg | ORAL_TABLET | Freq: Three times a day (TID) | ORAL | Status: AC

## 2012-12-22 MED ORDER — FAMOTIDINE 40 MG PO TABS: 40.0000 mg | ORAL_TABLET | Freq: Two times a day (BID) | ORAL | Status: DC

## 2012-12-22 MED ORDER — POTASSIUM CHLORIDE CRYS ER 20 MEQ PO TBCR
40.0000 meq | EXTENDED_RELEASE_TABLET | Freq: Once | ORAL | Status: AC
  Administered 2012-12-22: 40 meq via ORAL

## 2012-12-22 MED ORDER — BUDESONIDE-FORMOTEROL FUMARATE 160-4.5 MCG/ACT IN AERO: 2.0000 | INHALATION_SPRAY | Freq: Two times a day (BID) | RESPIRATORY_TRACT | Status: DC

## 2012-12-22 MED ORDER — GUAIFENESIN ER 600 MG PO TB12: 600.0000 mg | ORAL_TABLET | Freq: Two times a day (BID) | ORAL | Status: DC

## 2012-12-22 NOTE — Discharge Summary (Signed)
Physician Discharge Summary  Samantha [name] ZOX:[id number] DOB: April 11, 1941 DOA: 12/18/2012  PCP: [name], MD  Admit date: 12/18/2012 Discharge date: 12/22/2012  Time spent: >30 minutes  Recommendations for Outpatient Follow-up:  BMET to follow kidney functiona nd electrolytes Reassess abd pain and thrush infection,  Discharge Diagnoses:  C.diff Colitis Thrush  ASTHMA Dyslipidemia GERD Leukocytosis   Discharge Condition: stable and improved. Will follow with PCP in two weeks.  Diet recommendation: low fat diet  Filed Weights   12/18/12 2104 12/19/12 0519 12/19/12 2043  Weight: 49.896 kg (110 lb) 47.4 kg (104 lb 8 oz) 48.3 kg (106 lb 7.7 oz)    History of present illness:  72 y.o. female no history of asthma and hyperlipidemia who has had recent asthma exacerbation and was on antibiotics and steroids started experiencing abdominal pain with diarrhea last 2 days. The pain is diffuse and had associated diarrhea with no vomiting. Denies any fever chills. Patient had gone to the urgent care and was referred to the ER. CT abdomen pelvis done shows possible colitis and patient has been admitted for further management. Denies any chest pain or shortness of breath this time.   Hospital Course:  1-C. Diff colitis: still with ongoing diarrhea and abdominal discomfort. But overall feeling better.  -continue treatment with flagyl  -will advance diet and change abx's to PO  -replete electrolytes as needed  -avoid PPI and encourage the use of dairy products.  -patient reports eating/drinking better, no further episodes of diarrhea today; electrolytes WNL.  2-asthma: stable. No wheezing; continue symbicort, PRN albuterol and mucinex.   3-Leukocytosis:due to # 1. WBC's WNL at discharge   4-GERD: will continue using pepcid especially with ongoing C. Diff infection.   5-HLD: continue statins   6-thrush: will continue tx with nystatin   Procedures: See below for x-ray  reports.  Consultations:  none  Discharge Exam: Filed Vitals:   12/21/12 1402 12/21/12 2121 12/22/12 0628 12/22/12 0950  BP: 124/65 130/58 142/71 129/60  Pulse: 55 62 49 55  Temp: 97.5 F (36.4 C) 99 F (37.2 C) 98 F (36.7 C) 98.1 F (36.7 C)  TempSrc: Oral Oral Oral Oral  Resp: 16 15  18   Height:      Weight:      SpO2: 99% 100% 100% 97%   General: NAD, afebrile, still feeling sick on her stomach; tolerated diet and medications and reported no further episodes of diarrhea since last night. Cardiovascular: S1 and S2, no rubs or gallops  Respiratory: scattered rhonchi, otherwise clear and w/o wheezing  Abdomen: soft, ND, positive BS, mild tenderness to palpation mid abd and LLQ (improving/almost resolved)  Musculoskeletal: no erythema, no edema, no cyanosis  Discharge Instructions  Discharge Orders   Future Orders Complete By Expires     Discharge instructions  As directed     Comments:      Take medications as prescribed Keep yourself well hydrated Follow with PCP in 2 weeks Follow a low fat diet        Medication List    STOP taking these medications       amoxicillin-clavulanate 600-42.9 MG/5ML suspension  Commonly known as:  AUGMENTIN     bismuth subsalicylate 262 MG/15ML suspension  Commonly known as:  PEPTO BISMOL     esomeprazole 40 MG capsule  Commonly known as:  NEXIUM     hyoscyamine 0.125 MG SL tablet  Commonly known as:  LEVSIN SL     predniSONE 20 MG tablet  Commonly  known as:  DELTASONE      TAKE these medications       albuterol 108 (90 BASE) MCG/ACT inhaler  Commonly known as:  PROVENTIL HFA;VENTOLIN HFA  Inhale 2 puffs into the lungs every 6 (six) hours as needed for wheezing or shortness of breath. For shortness of breath     budesonide-formoterol 160-4.5 MCG/ACT inhaler  Commonly known as:  SYMBICORT  Inhale 2 puffs into the lungs 2 (two) times daily.     famotidine 40 MG tablet  Commonly known as:  PEPCID  Take 1 tablet (40  mg total) by mouth 2 (two) times daily.     guaiFENesin 600 MG 12 hr tablet  Commonly known as:  MUCINEX  Take 1 tablet (600 mg total) by mouth 2 (two) times daily.     metroNIDAZOLE 500 MG tablet  Commonly known as:  FLAGYL  Take 1 tablet (500 mg total) by mouth every 8 (eight) hours.     nystatin 100000 UNIT/ML suspension  Commonly known as:  MYCOSTATIN  Take 5 mLs (500,000 Units total) by mouth 4 (four) times daily. swift inside your mouth and spitted out     simvastatin 10 MG tablet  Commonly known as:  ZOCOR  Take 10 mg by mouth 2 (two) times a week. Take one tablet on Mondays and Wednesdays     VITAMIN D2 PO  Take 1.25 mg by mouth 2 (two) times a week. Take every Tuesday and Friday       Allergies  Allergen Reactions  . Contrast Media (Iodinated Diagnostic Agents) Other (See Comments)    Unknown   . Other Nausea And Vomiting    Seafood allergy.        Follow-up Information   Follow up with [name], MD. Schedule an appointment as soon as possible for a visit in 2 weeks.   Contact information:   [address] [place] [address] [phone]        The results of significant diagnostics from this hospitalization (including imaging, microbiology, ancillary and laboratory) are listed below for reference.    Significant Diagnostic Studies: Dg Chest 1 View  12/18/2012   *RADIOLOGY REPORT*  Clinical Data: Chest pain.  CHEST - 1 VIEW  Comparison: No priors.  Findings: Lung volumes are normal.  No consolidative airspace disease.  No pleural effusions.  No pneumothorax.  No pulmonary nodule or mass noted.  Pulmonary vasculature and the cardiomediastinal silhouette are within normal limits. Atherosclerosis in the thoracic aorta.  IMPRESSION: 1. No radiographic evidence of acute cardiopulmonary disease. 2.  Atherosclerosis.  Clinically significant discrepancy from primary report, if provided: None   Original Report Authenticated By: Trudie Reed, M.D.    Ct Abdomen Pelvis W Contrast  12/19/2012   *RADIOLOGY REPORT*  Clinical Data: Abdominal pain and diarrhea.  CT ABDOMEN AND PELVIS WITH CONTRAST  Technique:  Multidetector CT imaging of the abdomen and pelvis was performed following the standard protocol during bolus administration of intravenous contrast.  Contrast: OMNIPAQUE IOHEXOL 300 MG/ML  SOLN  Comparison: CT of the abdomen and pelvis 03/04/2012.  Findings:  Lung Bases: Atherosclerotic calcifications in the left circumflex coronary artery.  Abdomen/Pelvis:  The appearance of the liver, gallbladder, pancreas, spleen, bilateral adrenal glands and the left kidney is unremarkable.  The in the lateral aspect of the interpolar region of the right kidney there is a 10 x 6 mm lesion that measures 58 HU on the early phase postcontrast images and 42 HU  on the more delayed phase postcontrast images which is incompletely characterized.  Normal appendix.  No significant volume of ascites. No pneumoperitoneum.  No pathologic distension of small bowel.  No definite pathologic lymphadenopathy identified within the abdomen or pelvis on today's examination.  Mild atherosclerosis in the abdominal and pelvic vasculature, without evidence of aneurysm. There are a few areas of mild colonic wall thickening, best demonstrated in the hepatic and splenic flexures.  Status post hysterectomy.  Ovaries are not confidently identified may be surgically absent or atrophic.  Urinary bladder is normal in appearance.  Musculoskeletal: There are no aggressive appearing lytic or blastic lesions noted in the visualized portions of the skeleton.  IMPRESSION: 1.  Mild multifocal colonic wall thickening is nonspecific, but may suggest mild colitis. 2.  Indeterminate 10 x 6 mm lesion in the lateral aspect of the interpolar region of the right kidney.  Given the small size of this lesion, accurate measurement of attenuation values is limited, and given the stability in size of this lesion  compared to prior studies dating back to 2011, this is favored to represent a benign lesion such as a small cyst.  This could be more definitively characterized with follow-up non emergent MRI of the abdomen with and without Gadolinium if there is any clinical concern for neoplasm. 3.  Normal appendix. 4.  Additional incidental findings, as above.   Original Report Authenticated By: Trudie Reed, M.D.   Dg Chest Portable 1 View  12/09/2012   *RADIOLOGY REPORT*  Clinical Data: Short of breath.  Chest pain.  PORTABLE CHEST - 1 VIEW  Comparison: CT 12/17/2010.  Findings: Low volume chest.  Bilateral basilar atelectasis.  Low volumes accentuate the cardiopericardial silhouette. Monitoring leads are projected over the chest.  Airspace disease is difficult to exclude but opacity at the bases is most compatible with atelectasis.  Mediastinal contours appear within normal limits.  IMPRESSION: Low volume chest.   Original Report Authenticated By: Andreas Newport, M.D.   Dg Abd 2 Views  12/18/2012   *RADIOLOGY REPORT*  Clinical Data: Abdominal pain.  ABDOMEN - 2 VIEW  Comparison: No priors.  Findings: Gas and stool are noted throughout the colon extending to the level of the distal rectum.  There are a few dilated loops of small bowel in the central abdomen measuring up to 3.6 cm in diameter.  Multiple calcifications are seen projecting over the central abdomen and pelvis.  No gross evidence of pneumoperitoneum.  IMPRESSION: 1.  Nonspecific bowel gas pattern, as above.  Early or partial small bowel obstruction is not excluded.  Clinical correlation is recommended.  Clinically significant discrepancy from primary report, if provided: None   Original Report Authenticated By: Trudie Reed, M.D.    Microbiology: Recent Results (from the past 240 hour(s))  CLOSTRIDIUM DIFFICILE BY PCR     Status: Abnormal   Collection Time    12/19/12  9:58 AM      Result Value Range Status   C difficile by pcr POSITIVE (*)  NEGATIVE Final   Comment: CRITICAL RESULT CALLED TO, READ BACK BY AND VERIFIED WITH:     SCHAUER C RN 12/19/12 1141 COSTELLO B  STOOL CULTURE     Status: None   Collection Time    12/19/12  9:58 AM      Result Value Range Status   Specimen Description STOOL   Final   Special Requests NONE   Final   Culture NO SUSPICIOUS COLONIES, CONTINUING TO HOLD   Final  Report Status PENDING   Incomplete     Labs: Basic Metabolic Panel:  Recent Labs Lab 12/18/12 2120 12/19/12 0600 12/21/12 0530 12/22/12 0615  NA 134* 136 140 138  K 3.5 3.9 3.6 3.9  CL 98 103 108 105  CO2 27 27 24 27   GLUCOSE 104* 178* 80 87  BUN 6 7 5* 6  CREATININE 0.54 0.46* 0.51 0.48*  CALCIUM 8.9 8.2* 8.0* 8.0*  MG  --   --   --  1.9   Liver Function Tests:  Recent Labs Lab 12/18/12 2120 12/19/12 0600  AST 23 17  ALT 24 20  ALKPHOS 75 63  BILITOT 0.5 0.3  PROT 7.6 6.7  ALBUMIN 3.3* 2.7*    Recent Labs Lab 12/18/12 2120  LIPASE 11   CBC:  Recent Labs Lab 12/18/12 2017 12/18/12 2120 12/19/12 0600 12/21/12 0530  WBC 14.0* 16.2* 11.5* 6.8  NEUTROABS  --  11.9* 10.6*  --   HGB 11.1* 13.3 11.5* 11.0*  HCT 36.6* 38.7 35.3* 32.9*  MCV 84.0 76.9* 78.4 78.0  PLT  --  241 207 214    Signed:  Makesha Belitz  [facility] 12/22/2012, 12:05 PM

## 2012-12-22 NOTE — Progress Notes (Signed)
Patient discharge Home per Md order.  Discharge instructions reviewed with patient and family.  Copies of all forms given and explained. Patient/family voiced understanding of all instructions.  Discharge in no acute distress. Helana Macbride B. Najma Bozarth, RN BC, BSN, MSN 

## 2012-12-23 LAB — STOOL CULTURE

## 2013-02-13 ENCOUNTER — Encounter (HOSPITAL_COMMUNITY): Payer: Self-pay | Admitting: *Deleted

## 2013-02-13 ENCOUNTER — Emergency Department (HOSPITAL_COMMUNITY)
Admission: EM | Admit: 2013-02-13 | Discharge: 2013-02-13 | Disposition: A | Discharge status: Home / Self Care | Payer: Medicare Other | Attending: Emergency Medicine | Admitting: Emergency Medicine

## 2013-02-13 ENCOUNTER — Encounter (HOSPITAL_COMMUNITY): Payer: Self-pay | Admitting: Emergency Medicine

## 2013-02-13 ENCOUNTER — Emergency Department (HOSPITAL_COMMUNITY): Payer: Medicare Other

## 2013-02-13 ENCOUNTER — Emergency Department (HOSPITAL_COMMUNITY)
Admission: EM | Admit: 2013-02-13 | Discharge: 2013-02-13 | Disposition: A | Discharge status: Hospice | Payer: Medicare Other | Source: Home / Self Care | Attending: Emergency Medicine | Admitting: Emergency Medicine

## 2013-02-13 DIAGNOSIS — R300 Dysuria: Secondary | ICD-10-CM | POA: Insufficient documentation

## 2013-02-13 DIAGNOSIS — J45909 Unspecified asthma, uncomplicated: Secondary | ICD-10-CM | POA: Insufficient documentation

## 2013-02-13 DIAGNOSIS — R109 Unspecified abdominal pain: Secondary | ICD-10-CM

## 2013-02-13 DIAGNOSIS — K219 Gastro-esophageal reflux disease without esophagitis: Secondary | ICD-10-CM | POA: Insufficient documentation

## 2013-02-13 DIAGNOSIS — K921 Melena: Secondary | ICD-10-CM | POA: Insufficient documentation

## 2013-02-13 DIAGNOSIS — R319 Hematuria, unspecified: Secondary | ICD-10-CM | POA: Insufficient documentation

## 2013-02-13 DIAGNOSIS — Z87440 Personal history of urinary (tract) infections: Secondary | ICD-10-CM | POA: Insufficient documentation

## 2013-02-13 DIAGNOSIS — R1084 Generalized abdominal pain: Secondary | ICD-10-CM

## 2013-02-13 DIAGNOSIS — R3989 Other symptoms and signs involving the genitourinary system: Secondary | ICD-10-CM | POA: Insufficient documentation

## 2013-02-13 DIAGNOSIS — R079 Chest pain, unspecified: Secondary | ICD-10-CM | POA: Insufficient documentation

## 2013-02-13 LAB — COMPREHENSIVE METABOLIC PANEL
ALT: 12 U/L (ref 0–35)
AST: 21 U/L (ref 0–37)
Albumin: 3.6 g/dL (ref 3.5–5.2)
Alkaline Phosphatase: 63 U/L (ref 39–117)
BUN: 12 mg/dL (ref 6–23)
Calcium: 9.3 mg/dL (ref 8.4–10.5)
Chloride: 103 mEq/L (ref 96–112)
GFR calc Af Amer: >90 mL/min (ref >90)
Glucose, Bld: 79 mg/dL (ref 70–99)
Potassium: 3.5 mEq/L (ref 3.5–5.1)
Sodium: 140 mEq/L (ref 135–145)
Total Protein: 7.4 g/dL (ref 6.0–8.3)

## 2013-02-13 LAB — URINALYSIS, ROUTINE W REFLEX MICROSCOPIC
Glucose, UA: NEGATIVE mg/dL
Hgb urine dipstick: NEGATIVE
Ketones, ur: NEGATIVE mg/dL
Leukocytes, UA: NEGATIVE
Nitrite: NEGATIVE
Protein, ur: NEGATIVE mg/dL
Specific Gravity, Urine: 1.013 (ref 1.005–1.030)
pH: 7.5 (ref 5.0–8.0)

## 2013-02-13 LAB — CBC WITH DIFFERENTIAL/PLATELET
Basophils Relative: 1 % (ref 0–1)
Eosinophils Absolute: 0.4 10*3/uL (ref 0.0–0.7)
Eosinophils Relative: 5 % (ref 0–5)
HCT: 35.7 % — ABNORMAL LOW (ref 36.0–46.0)
Hemoglobin: 12.1 g/dL (ref 12.0–15.0)
Lymphs Abs: 2.5 10*3/uL (ref 0.7–4.0)
MCH: 26.7 pg (ref 26.0–34.0)
MCHC: 33.9 g/dL (ref 30.0–36.0)
MCV: 78.8 fL (ref 78.0–100.0)
Neutrophils Relative %: 51 % (ref 43–77)
Platelets: 265 10*3/uL (ref 150–400)
RDW: 16.3 % — ABNORMAL HIGH (ref 11.5–15.5)
WBC: 6.7 10*3/uL (ref 4.0–10.5)

## 2013-02-13 LAB — POCT URINALYSIS DIP (DEVICE)
Bilirubin Urine: NEGATIVE
Ketones, ur: NEGATIVE mg/dL
Leukocytes, UA: NEGATIVE

## 2013-02-13 MED ORDER — ONDANSETRON HCL 4 MG/2ML IJ SOLN
4.0000 mg | Freq: Once | INTRAMUSCULAR | Status: AC
  Administered 2013-02-13: 4 mg via INTRAVENOUS
  Filled 2013-02-13: qty 2

## 2013-02-13 MED ORDER — METHYLPREDNISOLONE SODIUM SUCC 125 MG IJ SOLR
125.0000 mg | Freq: Once | INTRAMUSCULAR | Status: AC
  Administered 2013-02-13: 125 mg via INTRAVENOUS
  Filled 2013-02-13: qty 2

## 2013-02-13 MED ORDER — IOHEXOL 300 MG/ML  SOLN
100.0000 mL | Freq: Once | INTRAMUSCULAR | Status: AC | PRN
  Administered 2013-02-13: 75 mL via INTRAVENOUS

## 2013-02-13 MED ORDER — MORPHINE SULFATE 4 MG/ML IJ SOLN
4.0000 mg | Freq: Once | INTRAMUSCULAR | Status: AC
  Administered 2013-02-13: 4 mg via INTRAVENOUS
  Filled 2013-02-13: qty 1

## 2013-02-13 MED ORDER — DIPHENHYDRAMINE HCL 50 MG/ML IJ SOLN
50.0000 mg | Freq: Once | INTRAMUSCULAR | Status: AC
  Administered 2013-02-13: 50 mg via INTRAVENOUS
  Filled 2013-02-13: qty 1

## 2013-02-13 MED ORDER — MORPHINE SULFATE 4 MG/ML IJ SOLN
6.0000 mg | Freq: Once | INTRAMUSCULAR | Status: AC
  Administered 2013-02-13: 6 mg via INTRAVENOUS
  Filled 2013-02-13: qty 2

## 2013-02-13 NOTE — ED Provider Notes (Signed)
CSN: [id number]     Arrival date & time 02/13/13  1112 History     First MD Initiated Contact with Patient 02/13/13 1122     Chief Complaint  Patient presents with  . Abdominal Pain   (Consider location/radiation/quality/duration/timing/severity/associated sxs/prior Treatment) HPI Comments: Patient reports she has had constant lower abdominal pain and burning with urination x 2 weeks.  States the pain is not relieved with tylenol.  Denies fevers, chills, N/V/D, change in bowel habits (reports chronic black stool).  Pt does admit to hematuria (states it is chronic).  Denies back pain, leg pain or swelling.  Past abdominal surgery in 2000-  Hysterectomy ("and something else") Pt notes recent admission June 16 for "stomach problem"  - per chart, patient was admitted for c.diff colitis.  Per urgent care notes, patient had positive c.diff but has not been taking medications at home. Patient does state she took all of her antibiotics until they were gone, and she has not had diarrhea since last month.     Patient is a 72 y.o. female presenting with abdominal pain. The history is provided by the patient and a relative. The history is limited by a language barrier.  Abdominal Pain Associated symptoms: dysuria   Associated symptoms: no constipation, no cough, no diarrhea, no fever, no nausea, no shortness of breath and no vomiting  Chest pain: states left chest wall pain x months, unchanged.     Past Medical History  Diagnosis Date  . UTI (lower urinary tract infection)   . Asthma   . GERD (gastroesophageal reflux disease)    Past Surgical History  Procedure Laterality Date  . Abdominal surgery    . Abdominal hysterectomy     No family history on file. History  Substance Use Topics  . Smoking status: Never Smoker   . Smokeless tobacco: Never Used  . Alcohol Use: No   OB History   Grav Para Term Preterm Abortions TAB SAB Ect Mult Living                 Review of Systems   Constitutional: Negative for fever.  Respiratory: Negative for cough and shortness of breath.   Cardiovascular: Chest pain: states left chest wall pain x months, unchanged.  Gastrointestinal: Positive for abdominal pain and blood in stool. Negative for nausea, vomiting, diarrhea and constipation.  Genitourinary: Positive for dysuria. Negative for urgency and frequency.    Allergies  Contrast media and Other  Home Medications   Current Outpatient Rx  Name  Route  Sig  Dispense  Refill  . budesonide-formoterol (SYMBICORT) 160-4.5 MCG/ACT inhaler   Inhalation   Inhale 2 puffs into the lungs 2 (two) times daily.   1 Inhaler   12   . Ergocalciferol (VITAMIN D2 PO)   Oral   Take 1.25 mg by mouth 2 (two) times a week. Take every Tuesday and Friday         . famotidine (PEPCID) 40 MG tablet   Oral   Take 1 tablet (40 mg total) by mouth 2 (two) times daily.   60 tablet   1   . guaiFENesin (MUCINEX) 600 MG 12 hr tablet   Oral   Take 1 tablet (600 mg total) by mouth 2 (two) times daily.   40 tablet   0   . simvastatin (ZOCOR) 10 MG tablet   Oral   Take 10 mg by mouth 2 (two) times a week. Take one tablet on Mondays and Wednesdays  BP 136/87  Pulse 54  Temp(Src) 97.5 F (36.4 C) (Oral)  Resp 18  SpO2 100% Physical Exam  Nursing note and vitals reviewed. Constitutional: She appears well-developed and well-nourished. No distress.  HENT:  Head: Normocephalic and atraumatic.  Eyes: No scleral icterus.  Neck: Normal range of motion. Neck supple.  Cardiovascular: Normal rate and regular rhythm.   Pulmonary/Chest: Effort normal and breath sounds normal. No respiratory distress. She has no wheezes. She has no rales.  Abdominal: Soft. Bowel sounds are normal. She exhibits no distension. There is tenderness. There is no rebound, no guarding and no CVA tenderness.  Diffuse lower abdominal tenderness  Neurological: She is alert.  Skin: She is not diaphoretic.    ED  Course   Procedures (including critical care time)  Labs Reviewed  CBC WITH DIFFERENTIAL - Abnormal; Notable for the following:    HCT 35.7 (*)    RDW 16.3 (*)    All other components within normal limits  COMPREHENSIVE METABOLIC PANEL - Abnormal; Notable for the following:    Creatinine, Ser 0.45 (*)    All other components within normal limits  CLOSTRIDIUM DIFFICILE BY PCR  URINE CULTURE  URINALYSIS, ROUTINE W REFLEX MICROSCOPIC   Ct Abdomen Pelvis W Contrast  02/13/2013   *RADIOLOGY REPORT*  Clinical Data: Periumbilical pain for several weeks.  Recent C difficile infection.  CT ABDOMEN AND PELVIS WITH CONTRAST  Technique:  Multidetector CT imaging of the abdomen and pelvis was performed following the standard protocol during bolus administration of intravenous contrast.  Contrast: 75mL OMNIPAQUE IOHEXOL 300 MG/ML  SOLN ; the patient was premedicated according to the emergency preparation with intravenous Solu-Medrol and Benadryl prep due to a history of iodinated contrast allergy.  There were no immediate complications.  Comparison: Abdominal pelvic CT 12/19/2012 and 02/20/2010.  Findings: Mild atelectasis at both lung bases appears stable. There is cardiomegaly and coronary artery disease.  The liver, gallbladder, biliary system and pancreas appear normal. The spleen and adrenal glands appear normal.  A small low-density lesion in the lower pole of the right kidney is stable, likely a cyst.  The left kidney appears normal.  There is no hydronephrosis.  Retrocecal appendix appears normal.  There is no evidence of colonic wall thickening or surrounding inflammation.  There are no enlarged abdominal pelvic lymph nodes.  There is stable mild atherosclerosis.  There is no pelvic mass status post hysterectomy. The bladder is mildly distended but otherwise appears normal.  There are no acute or worrisome osseous findings.  IMPRESSION:  1.  No evidence of residual/recurrent colonic or pericolonic  inflammatory change.  The appendix appears normal. 2.  Stable right renal cyst.   Original Report Authenticated By: Carey Bullocks, M.D.    12:00 PM Dr Jodi Mourning made aware of the patient.   1:09 PM Pt has documented allergy to IV contrast but it is unknown why this is the case.  Please see Dr Thayer Ohm note on 12/18/12 for discussion.  Pt was premedicated at this time with solumedrol and benedryl with no adverse events documented.  Pt has denied this allergy and it may be likely on her chart due to a known seafood allergy, but this is unclear.  Given this, we will premedicate her again with benadryl and solumedrol per [facility] Radiology guidelines.  1. Abdominal pain, generalized     MDM  Pt with two weeks of lower abdominal pain and dysuria.  Pt with recent hospitalization for c.diff colitis- pt states she finished her  antibiotics and has not had diarrhea for at least 1 month. Doubt recurrence of c.diff.  Pt reports chronic blood in stool and dark stool - Hgb is normal.  Hemoccult not done given initial c.diff concern.  UA is negative.  Culture sent.  Labs unremarkable.  CT negative.  Results reviewed by Dr Jodi Mourning and he discharged the patient, gave patient discharge instructions, PCP follow up.      Trixie Dredge, PA-C 02/13/13 1738

## 2013-02-13 NOTE — ED Notes (Signed)
C/o abd pain for a couple of weeks.  Denies vomiting constipation and diarrhea.  Admits to not able to urinate well.   Tylenol taken but no relief.

## 2013-02-13 NOTE — ED Provider Notes (Signed)
CSN: [id number]     Arrival date & time 02/13/13  9147 History     First MD Initiated Contact with Patient 02/13/13 1029     Chief Complaint  Patient presents with  . Abdominal Pain   (Consider location/radiation/quality/duration/timing/severity/associated sxs/prior Treatment) HPI Comments: She presents this morning to urgent care accompanied by a relative. She is describing that for the last 2 weeks she's been having lower abdominal pains the pain has gotten worse in the last several days. Reports tactile fevers at home yesterday. Denies any nausea vomiting or diarrhea as. Patient did not followup after her recent hospitalization course for C. difficile induced colitis. She denies taking any recent antibiotics or prednisone since her last hospital stay.  Reports pain is much worse when she walks or moves.   Patient is a 72 y.o. female presenting with abdominal pain.  Abdominal Pain Associated symptoms include abdominal pain. Pertinent negatives include no shortness of breath.    Past Medical History  Diagnosis Date  . UTI (lower urinary tract infection)   . Asthma   . GERD (gastroesophageal reflux disease)    Past Surgical History  Procedure Laterality Date  . Abdominal surgery    . Abdominal hysterectomy     History reviewed. No pertinent family history. History  Substance Use Topics  . Smoking status: Never Smoker   . Smokeless tobacco: Never Used  . Alcohol Use: No   OB History   Grav Para Term Preterm Abortions TAB SAB Ect Mult Living                 Review of Systems  Constitutional: Positive for fever, activity change and appetite change. Negative for diaphoresis and unexpected weight change.  Respiratory: Negative for shortness of breath.   Gastrointestinal: Positive for abdominal pain. Negative for nausea, vomiting, abdominal distention and rectal pain.  Genitourinary: Negative for dysuria and difficulty urinating.  Musculoskeletal: Negative for myalgias,  joint swelling, arthralgias and gait problem.  Skin: Negative for rash.    Allergies  Contrast media and Other  Home Medications   Current Outpatient Rx  Name  Route  Sig  Dispense  Refill  . albuterol (PROVENTIL HFA;VENTOLIN HFA) 108 (90 BASE) MCG/ACT inhaler   Inhalation   Inhale 2 puffs into the lungs every 6 (six) hours as needed for wheezing or shortness of breath. For shortness of breath         . budesonide-formoterol (SYMBICORT) 160-4.5 MCG/ACT inhaler   Inhalation   Inhale 2 puffs into the lungs 2 (two) times daily.   1 Inhaler   12   . Ergocalciferol (VITAMIN D2 PO)   Oral   Take 1.25 mg by mouth 2 (two) times a week. Take every Tuesday and Friday         . famotidine (PEPCID) 40 MG tablet   Oral   Take 1 tablet (40 mg total) by mouth 2 (two) times daily.   60 tablet   1   . guaiFENesin (MUCINEX) 600 MG 12 hr tablet   Oral   Take 1 tablet (600 mg total) by mouth 2 (two) times daily.   40 tablet   0   . nystatin (MYCOSTATIN) 100000 UNIT/ML suspension   Oral   Take 5 mLs (500,000 Units total) by mouth 4 (four) times daily. swift inside your mouth and spitted out   60 mL   0   . simvastatin (ZOCOR) 10 MG tablet   Oral   Take 10 mg by mouth 2 (  two) times a week. Take one tablet on Mondays and Wednesdays          BP 114/64  Pulse 57  Temp(Src) 97.2 F (36.2 C) (Oral)  Resp 18  SpO2 98% Physical Exam  Nursing note and vitals reviewed. Constitutional: She is oriented to person, place, and time. She appears well-developed.  Non-toxic appearance. She has a sickly appearance. She does not appear ill. No distress.  Neck: Neck supple.  Pulmonary/Chest: Effort normal and breath sounds normal.  Abdominal: Soft. Bowel sounds are normal. She exhibits no distension and no mass. There is no hepatosplenomegaly, splenomegaly or hepatomegaly. There is tenderness in the left lower quadrant. There is guarding. There is no rebound, no CVA tenderness and negative  Murphy's sign. No hernia.    Neurological: She is alert and oriented to person, place, and time.  Skin: No rash noted. No erythema.    ED Course   Procedures (including critical care time)  Labs Reviewed  POCT URINALYSIS DIP (DEVICE) - Abnormal; Notable for the following:    Hgb urine dipstick TRACE (*)    All other components within normal limits   No results found. 1. Abdominal pain     MDM  72 year old female presents with severe abdominal pain. Concerning lower abdominal discomfort with positive rebound in both left and right lower quadrant regions. Patient is reporting tactile fevers at home. Patient was admitted and treated for C. difficile induced colitis in June of the current year. She reports been symptomatic for 2 weeks but worsening in the last few days. Patient will be transferred to the emergency department. For further evaluation and management.    Jimmie Molly, MD 02/13/13 1104

## 2013-02-13 NOTE — ED Notes (Signed)
Pt is here with 2 weeks of lower abdominal pain and having problems with urination.  Sent from ucc

## 2013-02-13 NOTE — ED Notes (Signed)
Pt unable to have BM during ER stay.

## 2013-02-14 NOTE — ED Provider Notes (Signed)
Medical screening examination/treatment/procedure(s) were conducted as a shared visit with non-physician practitioner(s) or resident  and myself.  I personally evaluated the patient during the encounter and agree with the findings and plan unless otherwise indicated.    Mild suprapubic tenderness, no guarding/ distesion on exam. Well appearing.   Concern for colitis vs UTI.  UA neg.   CT no acute findings, reviewed. Pt improved in ED.  Translated via daughter.  Close fup discussed.   Filed Vitals:   02/13/13 1400 02/13/13 1415 02/13/13 1500 02/13/13 1640  BP: 113/89  124/62 129/65  Pulse:  49 49 54  Temp:      TempSrc:      Resp:    18  SpO2:  99% 98% 96%    Enid Skeens, MD 02/14/13 2224

## 2013-03-12 ENCOUNTER — Other Ambulatory Visit: Payer: Self-pay | Admitting: Family

## 2013-03-12 DIAGNOSIS — R109 Unspecified abdominal pain: Secondary | ICD-10-CM

## 2013-03-25 ENCOUNTER — Ambulatory Visit
Admission: RE | Admit: 2013-03-25 | Discharge: 2013-03-25 | Disposition: A | Discharge status: Home / Self Care | Payer: Medicare Other | Source: Ambulatory Visit | Attending: Family | Admitting: Family

## 2013-03-25 DIAGNOSIS — R109 Unspecified abdominal pain: Secondary | ICD-10-CM

## 2013-04-10 IMAGING — CT CT CERVICAL SPINE W/O CM
4 of 5 series · 9 of 20 positions shown, 10 images · non-contrast
Comparison: None.

CLINICAL DATA: Neck pain.  History of remote fall.  Evaluate for
possible C2 fracture.

CT CERVICAL SPINE WITHOUT CONTRAST
TECHNIQUE: Multidetector CT imaging of the cervical spine was
performed. Multiplanar CT image reconstructions were also
generated.

[Series 2: c spine bone · axial · 0.27mm/px · z∈[+128,+223]mm · 3 of 76 slices shown, 4 images]
[im 19/76  soft-tissue]
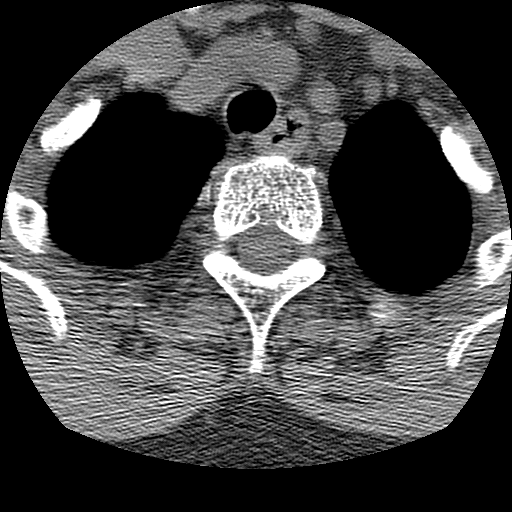
[im 19/76  bone]
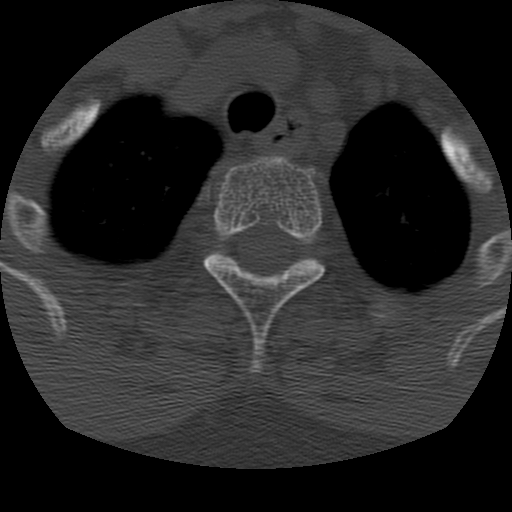
[im 38/76  bone]
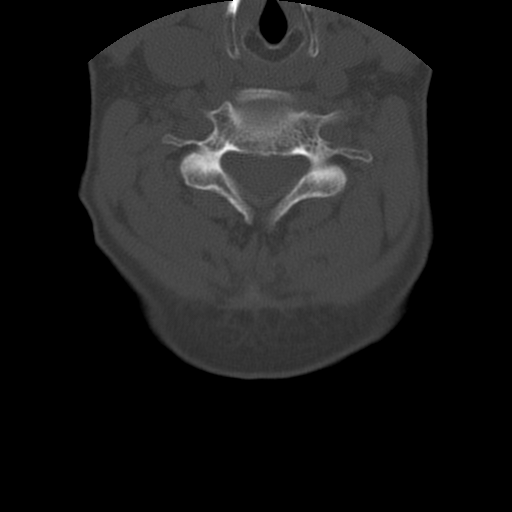
[im 57/76  bone]
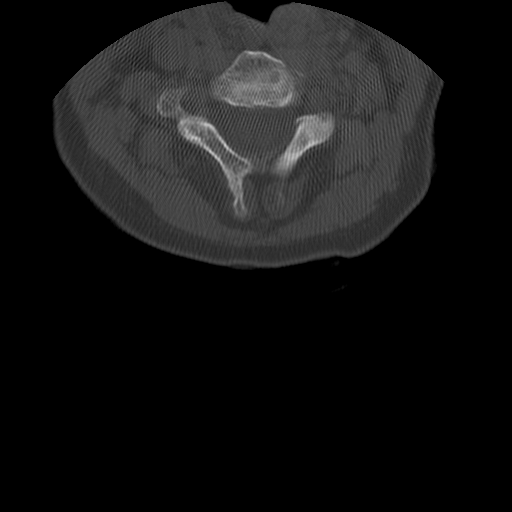

[Series 3: c spine soft · axial · 0.27mm/px · z∈[+146,+208]mm · 2 of 75 slices shown]
[im 25/75  soft-tissue]
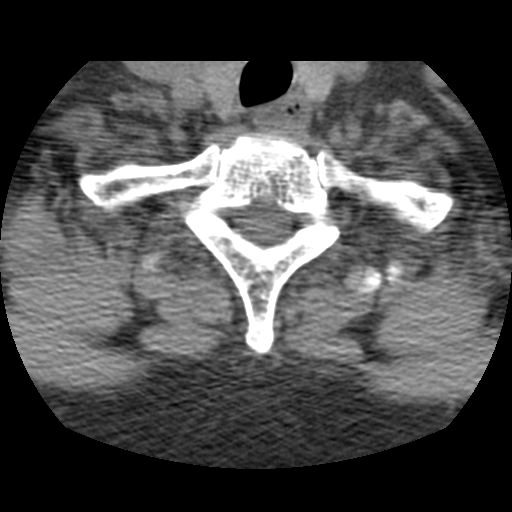
[im 50/75  soft-tissue]
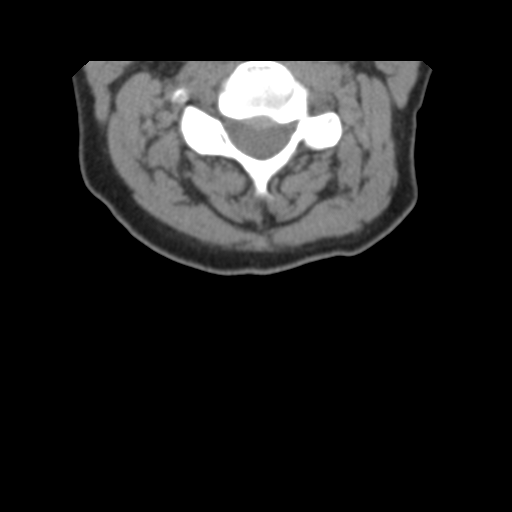

[Series 400: cor · coronal · 0.38mm/px · 1 of 40 slices shown]
[im 20/40  bone]
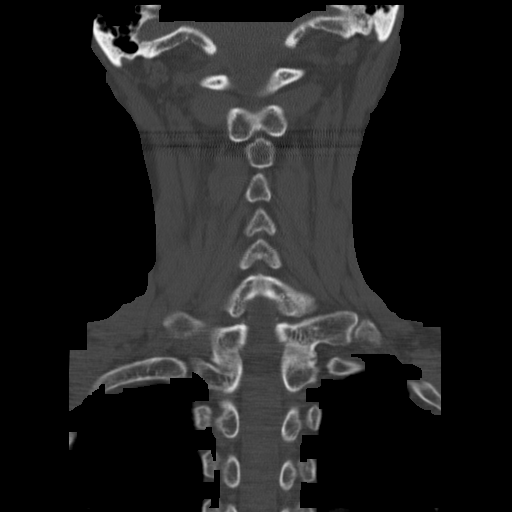

[Series 402: angled axial · axial · 0.23mm/px · z∈[+112,+203]mm · 3 of 96 slices shown]
[im 24/96  bone]
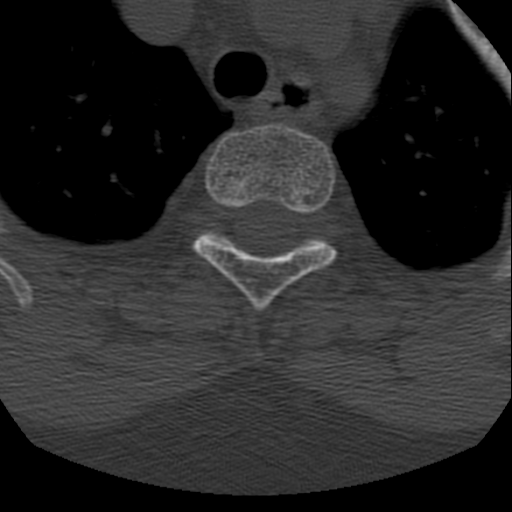
[im 48/96  bone]
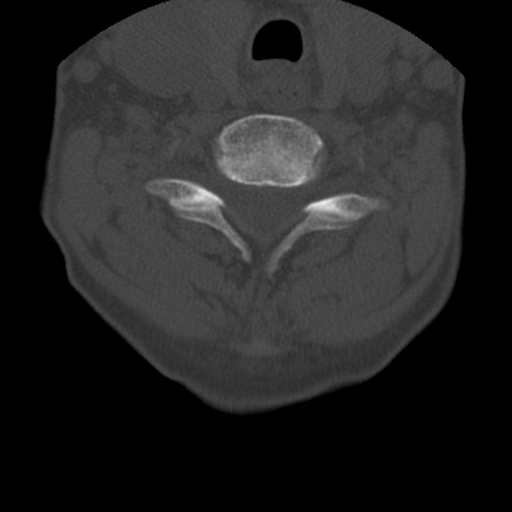
[im 72/96  bone]
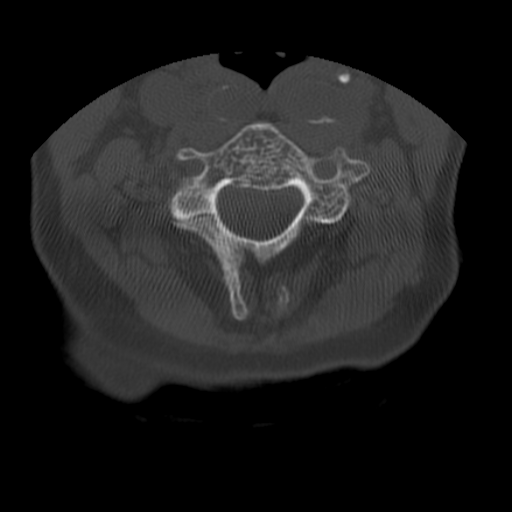

[9 of 20 positions shown; findings below may reference images not displayed]

FINDINGS: There are multiple small lytic bone lesions worrisome for
metastatic disease.  Does this patient have a known primary cancer?
A metastatic workup is recommended and a whole body bone scan may
be helpful for evaluation of other bone lesions.

The sagittal reformatted images demonstrate normal alignment of the
cervical vertebral bodies.  No acute fractures identified.  No
abnormal prevertebral soft tissue swelling.  The facets are
normally aligned.  No acute or pathologic fracture.  No abnormal
prevertebral soft tissue swelling.  The skull base C1 and C1-2
articulations are maintained.  The lung apices appear clear.
IMPRESSION: 1.  Findings suspicious for metastatic bone disease.  Recommend
metastatic workup.
2.  No acute bony findings or pathologic fracture.
3.  No large disc protrusions, significant spinal or foraminal
stenosis.

## 2013-06-07 ENCOUNTER — Other Ambulatory Visit: Payer: Self-pay | Admitting: Internal Medicine

## 2013-06-07 DIAGNOSIS — Z1231 Encounter for screening mammogram for malignant neoplasm of breast: Secondary | ICD-10-CM

## 2013-07-01 ENCOUNTER — Emergency Department (HOSPITAL_COMMUNITY)
Admission: EM | Admit: 2013-07-01 | Discharge: 2013-07-01 | Disposition: A | Discharge status: Home / Self Care | Payer: Medicare Other | Source: Home / Self Care | Attending: Family Medicine | Admitting: Family Medicine

## 2013-07-01 ENCOUNTER — Encounter (HOSPITAL_COMMUNITY): Payer: Self-pay | Admitting: Emergency Medicine

## 2013-07-01 DIAGNOSIS — J069 Acute upper respiratory infection, unspecified: Secondary | ICD-10-CM

## 2013-07-01 MED ORDER — GUAIFENESIN-CODEINE 100-10 MG/5ML PO SOLN: 5.0000 mL | Freq: Every evening | ORAL | Status: DC | PRN

## 2013-07-01 MED ORDER — ALBUTEROL SULFATE HFA 108 (90 BASE) MCG/ACT IN AERS: 2.0000 | INHALATION_SPRAY | Freq: Four times a day (QID) | RESPIRATORY_TRACT | Status: DC | PRN

## 2013-07-01 MED ORDER — IPRATROPIUM BROMIDE 0.06 % NA SOLN: 2.0000 | Freq: Four times a day (QID) | NASAL | Status: DC

## 2013-07-01 NOTE — ED Provider Notes (Signed)
Samantha [name] is a 72 y.o. female who presents to Urgent Care today for Nasal congestion sore throat and cough starting yesterday evening. No shortness of breath nausea vomiting or diarrhea. No wheezing. No albuterol use.  Her brother has been sick with a viral illness recently. Patient has not tried any medications yet. She feels well otherwise. Eating and drinking well.   Past Medical History  Diagnosis Date  . UTI (lower urinary tract infection)   . Asthma   . GERD (gastroesophageal reflux disease)    History  Substance Use Topics  . Smoking status: Never Smoker   . Smokeless tobacco: Never Used  . Alcohol Use: No   ROS as above Medications reviewed. No current facility-administered medications for this encounter.   Current Outpatient Prescriptions  Medication Sig Dispense Refill  . albuterol (PROVENTIL HFA;VENTOLIN HFA) 108 (90 BASE) MCG/ACT inhaler Inhale 2 puffs into the lungs every 6 (six) hours as needed for wheezing or shortness of breath.  1 Inhaler  2  . budesonide-formoterol (SYMBICORT) 160-4.5 MCG/ACT inhaler Inhale 2 puffs into the lungs 2 (two) times daily.  1 Inhaler  12  . Ergocalciferol (VITAMIN D2 PO) Take 1.25 mg by mouth 2 (two) times a week. Take every Tuesday and Friday      . famotidine (PEPCID) 40 MG tablet Take 1 tablet (40 mg total) by mouth 2 (two) times daily.  60 tablet  1  . guaiFENesin (MUCINEX) 600 MG 12 hr tablet Take 1 tablet (600 mg total) by mouth 2 (two) times daily.  40 tablet  0  . guaiFENesin-codeine 100-10 MG/5ML syrup Take 5 mLs by mouth at bedtime as needed for cough.  120 mL  0  . ipratropium (ATROVENT) 0.06 % nasal spray Place 2 sprays into both nostrils 4 (four) times daily.  15 mL  1  . simvastatin (ZOCOR) 10 MG tablet Take 10 mg by mouth 2 (two) times a week. Take one tablet on Mondays and Wednesdays        Exam:  BP 106/71  Pulse 62  Temp(Src) 98 F (36.7 C) (Oral)  Resp 16  SpO2 100% Gen: Well NAD nontoxic appearing HEENT: EOMI,   MMM, posterior pharynx with mild cobblestoning. Tympanic membranes normal appearing bilaterally. Lungs: Normal work of breathing. CTABL Heart: RRR no MRG Abd: NABS, Soft. NT, ND Exts: Non edematous BL  LE, warm and well perfused.   Results for orders placed during the hospital encounter of 07/01/13 (from the past 24 hour(s))  POCT RAPID STREP A ([facility] ONLY)     Status: None   Collection Time    07/01/13 12:33 PM      Result Value Range   Streptococcus, Group A Screen (Direct) NEGATIVE  NEGATIVE   No results found.  Assessment and Plan: 72 y.o. female with viral URI. Plan for symptomatic management with codeine containing cough medication Atrovent nasal spray, Tylenol and ibuprofen. Additionally I will refill albuterol for use in case patient develops any asthma symptoms. Discussed warning signs or symptoms. Please see discharge instructions. Patient expresses understanding.     Rodolph Bong, MD 07/01/13 [phone]

## 2013-07-01 NOTE — ED Notes (Signed)
Pt c/o sotre throat onset yest w/sxs that include: congestion, cough, HA... Reports brother dx w/flu Denies: f/v/n/d. Alert w/no signs of acute distress.

## 2013-07-03 LAB — CULTURE, GROUP A STREP

## 2013-07-11 ENCOUNTER — Ambulatory Visit: Payer: Medicare Other

## 2013-08-01 ENCOUNTER — Emergency Department (HOSPITAL_COMMUNITY)
Admission: EM | Admit: 2013-08-01 | Discharge: 2013-08-01 | Disposition: A | Discharge status: Home / Self Care | Payer: Medicare Other | Attending: Emergency Medicine | Admitting: Emergency Medicine

## 2013-08-01 ENCOUNTER — Encounter (HOSPITAL_COMMUNITY): Payer: Self-pay | Admitting: Emergency Medicine

## 2013-08-01 ENCOUNTER — Emergency Department (HOSPITAL_COMMUNITY): Payer: Medicare Other

## 2013-08-01 DIAGNOSIS — K5732 Diverticulitis of large intestine without perforation or abscess without bleeding: Secondary | ICD-10-CM | POA: Insufficient documentation

## 2013-08-01 DIAGNOSIS — D72829 Elevated white blood cell count, unspecified: Secondary | ICD-10-CM | POA: Insufficient documentation

## 2013-08-01 DIAGNOSIS — Z9889 Other specified postprocedural states: Secondary | ICD-10-CM | POA: Insufficient documentation

## 2013-08-01 DIAGNOSIS — IMO0002 Reserved for concepts with insufficient information to code with codable children: Secondary | ICD-10-CM | POA: Insufficient documentation

## 2013-08-01 DIAGNOSIS — K5792 Diverticulitis of intestine, part unspecified, without perforation or abscess without bleeding: Secondary | ICD-10-CM

## 2013-08-01 DIAGNOSIS — N390 Urinary tract infection, site not specified: Secondary | ICD-10-CM

## 2013-08-01 DIAGNOSIS — E7800 Pure hypercholesterolemia, unspecified: Secondary | ICD-10-CM | POA: Insufficient documentation

## 2013-08-01 DIAGNOSIS — K219 Gastro-esophageal reflux disease without esophagitis: Secondary | ICD-10-CM | POA: Insufficient documentation

## 2013-08-01 DIAGNOSIS — J45909 Unspecified asthma, uncomplicated: Secondary | ICD-10-CM | POA: Insufficient documentation

## 2013-08-01 DIAGNOSIS — Z90710 Acquired absence of both cervix and uterus: Secondary | ICD-10-CM | POA: Insufficient documentation

## 2013-08-01 DIAGNOSIS — Z79899 Other long term (current) drug therapy: Secondary | ICD-10-CM | POA: Insufficient documentation

## 2013-08-01 LAB — COMPREHENSIVE METABOLIC PANEL
ALBUMIN: 3.5 g/dL (ref 3.5–5.2)
ALT: 13 U/L (ref 0–35)
AST: 16 U/L (ref 0–37)
Alkaline Phosphatase: 63 U/L (ref 39–117)
BILIRUBIN TOTAL: 0.3 mg/dL (ref 0.3–1.2)
BUN: 8 mg/dL (ref 6–23)
CALCIUM: 8.4 mg/dL (ref 8.4–10.5)
CO2: 28 mEq/L (ref 19–32)
CREATININE: 0.44 mg/dL — AB (ref 0.50–1.10)
Chloride: 99 mEq/L (ref 96–112)
GFR calc Af Amer: >90 mL/min (ref >90)
GFR calc non Af Amer: >90 mL/min (ref >90)
Glucose, Bld: 92 mg/dL (ref 70–99)
Potassium: 3.4 mEq/L — ABNORMAL LOW (ref 3.7–5.3)
Sodium: 137 mEq/L (ref 137–147)
Total Protein: 7.3 g/dL (ref 6.0–8.3)

## 2013-08-01 LAB — URINALYSIS, ROUTINE W REFLEX MICROSCOPIC
Bilirubin Urine: NEGATIVE
GLUCOSE, UA: NEGATIVE mg/dL
Ketones, ur: NEGATIVE mg/dL
Nitrite: NEGATIVE
PROTEIN: NEGATIVE mg/dL
SPECIFIC GRAVITY, URINE: 1.001 — AB (ref 1.005–1.030)
Urobilinogen, UA: 0.2 mg/dL (ref 0.0–1.0)
pH: 6.5 (ref 5.0–8.0)

## 2013-08-01 LAB — URINE MICROSCOPIC-ADD ON

## 2013-08-01 LAB — CBC WITH DIFFERENTIAL/PLATELET
BASOS ABS: 0.1 10*3/uL (ref 0.0–0.1)
BASOS PCT: 0 % (ref 0–1)
EOS ABS: 1.7 10*3/uL — AB (ref 0.0–0.7)
EOS PCT: 15 % — AB (ref 0–5)
HEMATOCRIT: 34.4 % — AB (ref 36.0–46.0)
HEMOGLOBIN: 11.3 g/dL — AB (ref 12.0–15.0)
Lymphocytes Relative: 21 % (ref 12–46)
Lymphs Abs: 2.5 10*3/uL (ref 0.7–4.0)
MCH: 26.2 pg (ref 26.0–34.0)
MCHC: 32.8 g/dL (ref 30.0–36.0)
MCV: 79.6 fL (ref 78.0–100.0)
MONO ABS: 0.4 10*3/uL (ref 0.1–1.0)
MONOS PCT: 4 % (ref 3–12)
Neutro Abs: 7.1 10*3/uL (ref 1.7–7.7)
Neutrophils Relative %: 60 % (ref 43–77)
Platelets: 216 10*3/uL (ref 150–400)
RBC: 4.32 MIL/uL (ref 3.87–5.11)
RDW: 14.2 % (ref 11.5–15.5)
WBC: 11.8 10*3/uL — ABNORMAL HIGH (ref 4.0–10.5)

## 2013-08-01 LAB — LIPASE, BLOOD: Lipase: 17 U/L (ref 11–59)

## 2013-08-01 MED ORDER — ONDANSETRON HCL 4 MG/2ML IJ SOLN
4.0000 mg | Freq: Once | INTRAMUSCULAR | Status: AC
  Administered 2013-08-01: 4 mg via INTRAVENOUS
  Filled 2013-08-01: qty 2

## 2013-08-01 MED ORDER — HYDROCODONE-ACETAMINOPHEN 5-325 MG PO TABS: 2.0000 | ORAL_TABLET | ORAL | Status: DC | PRN

## 2013-08-01 MED ORDER — CIPROFLOXACIN HCL 500 MG PO TABS: 500.0000 mg | ORAL_TABLET | Freq: Two times a day (BID) | ORAL | Status: DC

## 2013-08-01 MED ORDER — METRONIDAZOLE 500 MG PO TABS: 500.0000 mg | ORAL_TABLET | Freq: Three times a day (TID) | ORAL | Status: DC

## 2013-08-01 MED ORDER — SODIUM CHLORIDE 0.9 % IV BOLUS (SEPSIS)
1000.0000 mL | Freq: Once | INTRAVENOUS | Status: AC
  Administered 2013-08-01: 1000 mL via INTRAVENOUS

## 2013-08-01 MED ORDER — KETOROLAC TROMETHAMINE 30 MG/ML IJ SOLN
30.0000 mg | Freq: Once | INTRAMUSCULAR | Status: AC
  Administered 2013-08-01: 30 mg via INTRAVENOUS
  Filled 2013-08-01: qty 1

## 2013-08-01 NOTE — ED Provider Notes (Signed)
CSN: [id number]     Arrival date & time 08/01/13  4098 History   First MD Initiated Contact with Patient 08/01/13 0740     Chief Complaint  Patient presents with  . Abdominal Pain   (Consider location/radiation/quality/duration/timing/severity/associated sxs/prior Treatment) HPI Comments: Patient is a 73 year old female with history of high cholesterol and asthma. She presents today with complaints of suprapubic and left lower quadrant abdominal pain that has been going on for the last day and a half. She has had nausea, vomiting, and diarrhea, and also is reporting some dysuria. She denies fevers or chills. She denies ill contacts.  Patient is a 73 y.o. female presenting with abdominal pain. The history is provided by the patient.  Abdominal Pain Pain location:  LLQ Pain quality: cramping   Pain radiates to:  Does not radiate Pain severity:  Moderate Onset quality:  Gradual Duration:  36 hours Timing:  Constant Progression:  Worsening Chronicity:  New Relieved by:  Nothing Worsened by:  Nothing tried Ineffective treatments:  None tried Associated symptoms: diarrhea, dysuria, nausea and vomiting     Past Medical History  Diagnosis Date  . UTI (lower urinary tract infection)   . Asthma   . GERD (gastroesophageal reflux disease)    Past Surgical History  Procedure Laterality Date  . Abdominal surgery    . Abdominal hysterectomy     History reviewed. No pertinent family history. History  Substance Use Topics  . Smoking status: Never Smoker   . Smokeless tobacco: Never Used  . Alcohol Use: No   OB History   Grav Para Term Preterm Abortions TAB SAB Ect Mult Living                 Review of Systems  Gastrointestinal: Positive for nausea, vomiting, abdominal pain and diarrhea.  Genitourinary: Positive for dysuria.  All other systems reviewed and are negative.    Allergies  Contrast media and Other  Home Medications   Current Outpatient Rx  Name  Route  Sig   Dispense  Refill  . albuterol (PROVENTIL HFA;VENTOLIN HFA) 108 (90 BASE) MCG/ACT inhaler   Inhalation   Inhale 2 puffs into the lungs every 6 (six) hours as needed for wheezing or shortness of breath.   1 Inhaler   2   . budesonide-formoterol (SYMBICORT) 160-4.5 MCG/ACT inhaler   Inhalation   Inhale 2 puffs into the lungs 2 (two) times daily.   1 Inhaler   12   . Ergocalciferol (VITAMIN D2 PO)   Oral   Take 1.25 mg by mouth 2 (two) times a week. Take every Tuesday and Friday         . famotidine (PEPCID) 40 MG tablet   Oral   Take 1 tablet (40 mg total) by mouth 2 (two) times daily.   60 tablet   1   . guaiFENesin (MUCINEX) 600 MG 12 hr tablet   Oral   Take 1 tablet (600 mg total) by mouth 2 (two) times daily.   40 tablet   0   . guaiFENesin-codeine 100-10 MG/5ML syrup   Oral   Take 5 mLs by mouth at bedtime as needed for cough.   120 mL   0   . ipratropium (ATROVENT) 0.06 % nasal spray   Each Nare   Place 2 sprays into both nostrils 4 (four) times daily.   15 mL   1   . simvastatin (ZOCOR) 10 MG tablet   Oral   Take 10 mg by  mouth 2 (two) times a week. Take one tablet on Mondays and Wednesdays          BP 129/70  Pulse 66  Temp(Src) 97.8 F (36.6 C) (Oral)  Resp 14  Ht 5\' 4"  (1.626 m)  Wt 130 lb (58.968 kg)  BMI 22.30 kg/m2  SpO2 100% Physical Exam  Nursing note and vitals reviewed. Constitutional: She is oriented to person, place, and time. She appears well-developed and well-nourished. No distress.  HENT:  Head: Normocephalic and atraumatic.  Neck: Normal range of motion. Neck supple.  Cardiovascular: Normal rate and regular rhythm.  Exam reveals no gallop and no friction rub.   No murmur heard. Pulmonary/Chest: Effort normal and breath sounds normal. No respiratory distress. She has no wheezes.  Abdominal: Soft. Bowel sounds are normal. She exhibits no distension and no mass. There is tenderness.  There is tenderness to palpation in the left  lower quadrant and suprapubic region. There is no rebound or guarding.  Musculoskeletal: Normal range of motion.  Neurological: She is alert and oriented to person, place, and time.  Skin: Skin is warm and dry. She is not diaphoretic.    ED Course  Procedures (including critical care time) Labs Review Labs Reviewed  CBC WITH DIFFERENTIAL  COMPREHENSIVE METABOLIC PANEL  URINALYSIS, ROUTINE W REFLEX MICROSCOPIC  LIPASE, BLOOD   Imaging Review No results found.    MDM  No diagnosis found. Patient is a 73 year old female presents with complaints of left lower quadrant pain. Workup reveals an elevated white count with findings suggestive of focal colitis or diverticulitis. There is also evidence for urinary tract infection. She is nontoxic and appears very comfortable. I believe she is stable for discharge with oral antibiotics and pain medication. She will be discharged with Cipro, Flagyl, and hydrocodone. She understands to return if she develops worsening pain, high fever, or bloody stool.    Geoffery Lyonsouglas Syre Knerr, MD 08/01/13 [phone]

## 2013-08-01 NOTE — ED Notes (Signed)
Patient ambulated to restroom. States she has to "pee really bad but very little comes and it burns".

## 2013-08-01 NOTE — Discharge Instructions (Signed)
Cipro and Flagyl as prescribed.  Hydrocodone as needed for pain.  Return to the emergency department if you develop high fever, severe abdominal pain, bloody stool, or other new or concerning symptoms.   Diverticulitis A diverticulum is a small pouch or sac on the colon. Diverticulosis is the presence of these diverticula on the colon. Diverticulitis is the irritation (inflammation) or infection of diverticula. CAUSES  The colon and its diverticula contain bacteria. If food particles block the tiny opening to a diverticulum, the bacteria inside can grow and cause an increase in pressure. This leads to infection and inflammation and is called diverticulitis. SYMPTOMS   Abdominal pain and tenderness. Usually, the pain is located on the left side of your abdomen. However, it could be located elsewhere.  Fever.  Bloating.  Feeling sick to your stomach (nausea).  Throwing up (vomiting).  Abnormal stools. DIAGNOSIS  Your caregiver will take a history and perform a physical exam. Since many things can cause abdominal pain, other tests may be necessary. Tests may include:  Blood tests.  Urine tests.  X-ray of the abdomen.  CT scan of the abdomen. Sometimes, surgery is needed to determine if diverticulitis or other conditions are causing your symptoms. TREATMENT  Most of the time, you can be treated without surgery. Treatment includes:  Resting the bowels by only having liquids for a few days. As you improve, you will need to eat a low-fiber diet.  Intravenous (IV) fluids if you are losing body fluids (dehydrated).  Antibiotic medicines that treat infections may be given.  Pain and nausea medicine, if needed.  Surgery if the inflamed diverticulum has burst. HOME CARE INSTRUCTIONS   Try a clear liquid diet (broth, tea, or water for as long as directed by your caregiver). You may then gradually begin a low-fiber diet as tolerated.  A low-fiber diet is a diet with less than 10  grams of fiber. Choose the foods below to reduce fiber in the diet:  White breads, cereals, rice, and pasta.  Cooked fruits and vegetables or soft fresh fruits and vegetables without the skin.  Ground or well-cooked tender beef, ham, veal, lamb, pork, or poultry.  Eggs and seafood.  After your diverticulitis symptoms have improved, your caregiver may put you on a high-fiber diet. A high-fiber diet includes 14 grams of fiber for every 1000 calories consumed. For a standard 2000 calorie diet, you would need 28 grams of fiber. Follow these diet guidelines to help you increase the fiber in your diet. It is important to slowly increase the amount fiber in your diet to avoid gas, constipation, and bloating.  Choose whole-grain breads, cereals, pasta, and brown rice.  Choose fresh fruits and vegetables with the skin on. Do not overcook vegetables because the more vegetables are cooked, the more fiber is lost.  Choose more nuts, seeds, legumes, dried peas, beans, and lentils.  Look for food products that have greater than 3 grams of fiber per serving on the Nutrition Facts label.  Take all medicine as directed by your caregiver.  If your caregiver has given you a follow-up appointment, it is very important that you go. Not going could result in lasting (chronic) or permanent injury, pain, and disability. If there is any problem keeping the appointment, call to reschedule. SEEK MEDICAL CARE IF:   Your pain does not improve.  You have a hard time advancing your diet beyond clear liquids.  Your bowel movements do not return to normal. SEEK IMMEDIATE MEDICAL CARE IF:  Your pain becomes worse.  You have an oral temperature above 102 F (38.9 C), not controlled by medicine.  You have repeated vomiting.  You have bloody or black, tarry stools.  Symptoms that brought you to your caregiver become worse or are not getting better. MAKE SURE YOU:   Understand these instructions.  Will  watch your condition.  Will get help right away if you are not doing well or get worse. Document Released: 03/30/2005 Document Revised: 09/12/2011 Document Reviewed: 07/26/2010 [facility] Patient Information 2014 [place]ExitCare, [place].  Urinary Tract Infection Urinary tract infections (UTIs) can develop anywhere along your urinary tract. Your urinary tract is your body's drainage system for removing wastes and extra water. Your urinary tract includes two kidneys, two ureters, a bladder, and a urethra. Your kidneys are a pair of bean-shaped organs. Each kidney is about the size of your fist. They are located below your ribs, one on each side of your spine. CAUSES Infections are caused by microbes, which are microscopic organisms, including fungi, viruses, and bacteria. These organisms are so small that they can only be seen through a microscope. Bacteria are the microbes that most commonly cause UTIs. SYMPTOMS  Symptoms of UTIs may vary by age and gender of the patient and by the location of the infection. Symptoms in young women typically include a frequent and intense urge to urinate and a painful, burning feeling in the bladder or urethra during urination. Older women and men are more likely to be tired, shaky, and weak and have muscle aches and abdominal pain. A fever may mean the infection is in your kidneys. Other symptoms of a kidney infection include pain in your back or sides below the ribs, nausea, and vomiting. DIAGNOSIS To diagnose a UTI, your caregiver will ask you about your symptoms. Your caregiver also will ask to provide a urine sample. The urine sample will be tested for bacteria and white blood cells. White blood cells are made by your body to help fight infection. TREATMENT  Typically, UTIs can be treated with medication. Because most UTIs are caused by a bacterial infection, they usually can be treated with the use of antibiotics. The choice of antibiotic and length of treatment depend on  your symptoms and the type of bacteria causing your infection. HOME CARE INSTRUCTIONS  If you were prescribed antibiotics, take them exactly as your caregiver instructs you. Finish the medication even if you feel better after you have only taken some of the medication.  Drink enough water and fluids to keep your urine clear or pale yellow.  Avoid caffeine, tea, and carbonated beverages. They tend to irritate your bladder.  Empty your bladder often. Avoid holding urine for long periods of time.  Empty your bladder before and after sexual intercourse.  After a bowel movement, women should cleanse from front to back. Use each tissue only once. SEEK MEDICAL CARE IF:   You have back pain.  You develop a fever.  Your symptoms do not begin to resolve within 3 days. SEEK IMMEDIATE MEDICAL CARE IF:   You have severe back pain or lower abdominal pain.  You develop chills.  You have nausea or vomiting.  You have continued burning or discomfort with urination. MAKE SURE YOU:   Understand these instructions.  Will watch your condition.  Will get help right away if you are not doing well or get worse. Document Released: 03/30/2005 Document Revised: 12/20/2011 Document Reviewed: 07/29/2011 [facility] Patient Information 2014 [place]Care, [place].

## 2013-08-01 NOTE — ED Notes (Addendum)
Patient presents to ED with complaints of abdominal pain for a couple years and has been seen in the past for the same, patient was seen yesterday by PCP and was told to take OTC meds and if she was not better to come to ED. Patient reports burning and pain with urination some blood this morning and nausea.

## 2013-10-09 DIAGNOSIS — Z79899 Other long term (current) drug therapy: Secondary | ICD-10-CM | POA: Insufficient documentation

## 2013-10-09 DIAGNOSIS — Z90710 Acquired absence of both cervix and uterus: Secondary | ICD-10-CM | POA: Insufficient documentation

## 2013-10-09 DIAGNOSIS — Z9889 Other specified postprocedural states: Secondary | ICD-10-CM | POA: Insufficient documentation

## 2013-10-09 DIAGNOSIS — N390 Urinary tract infection, site not specified: Secondary | ICD-10-CM | POA: Insufficient documentation

## 2013-10-09 DIAGNOSIS — IMO0002 Reserved for concepts with insufficient information to code with codable children: Secondary | ICD-10-CM | POA: Insufficient documentation

## 2013-10-09 DIAGNOSIS — J45909 Unspecified asthma, uncomplicated: Secondary | ICD-10-CM | POA: Insufficient documentation

## 2013-10-09 DIAGNOSIS — R197 Diarrhea, unspecified: Secondary | ICD-10-CM | POA: Insufficient documentation

## 2013-10-09 DIAGNOSIS — R112 Nausea with vomiting, unspecified: Secondary | ICD-10-CM | POA: Insufficient documentation

## 2013-10-09 DIAGNOSIS — Z8719 Personal history of other diseases of the digestive system: Secondary | ICD-10-CM | POA: Insufficient documentation

## 2013-10-10 ENCOUNTER — Encounter (HOSPITAL_COMMUNITY): Payer: Self-pay | Admitting: Emergency Medicine

## 2013-10-10 ENCOUNTER — Emergency Department (HOSPITAL_COMMUNITY)
Admission: EM | Admit: 2013-10-10 | Discharge: 2013-10-10 | Disposition: A | Discharge status: Home / Self Care | Payer: Medicare Other | Attending: Emergency Medicine | Admitting: Emergency Medicine

## 2013-10-10 DIAGNOSIS — N390 Urinary tract infection, site not specified: Secondary | ICD-10-CM

## 2013-10-10 LAB — CBC WITH DIFFERENTIAL/PLATELET
BASOS PCT: 1 % (ref 0–1)
Basophils Absolute: 0.1 10*3/uL (ref 0.0–0.1)
EOS ABS: 1 10*3/uL — AB (ref 0.0–0.7)
Eosinophils Relative: 10 % — ABNORMAL HIGH (ref 0–5)
HEMATOCRIT: 32.4 % — AB (ref 36.0–46.0)
HEMOGLOBIN: 10.9 g/dL — AB (ref 12.0–15.0)
Lymphocytes Relative: 26 % (ref 12–46)
Lymphs Abs: 2.5 10*3/uL (ref 0.7–4.0)
MCH: 26.8 pg (ref 26.0–34.0)
MCHC: 33.6 g/dL (ref 30.0–36.0)
MCV: 79.8 fL (ref 78.0–100.0)
MONO ABS: 0.5 10*3/uL (ref 0.1–1.0)
MONOS PCT: 5 % (ref 3–12)
Neutro Abs: 5.5 10*3/uL (ref 1.7–7.7)
Neutrophils Relative %: 58 % (ref 43–77)
Platelets: 195 10*3/uL (ref 150–400)
RBC: 4.06 MIL/uL (ref 3.87–5.11)
RDW: 13.8 % (ref 11.5–15.5)
WBC: 9.5 10*3/uL (ref 4.0–10.5)

## 2013-10-10 LAB — URINALYSIS, ROUTINE W REFLEX MICROSCOPIC
Bilirubin Urine: NEGATIVE
GLUCOSE, UA: NEGATIVE mg/dL
Ketones, ur: NEGATIVE mg/dL
Nitrite: NEGATIVE
PH: 7 (ref 5.0–8.0)
PROTEIN: 100 mg/dL — AB
Specific Gravity, Urine: 1.005 (ref 1.005–1.030)
Urobilinogen, UA: 0.2 mg/dL (ref 0.0–1.0)

## 2013-10-10 LAB — URINE MICROSCOPIC-ADD ON

## 2013-10-10 LAB — COMPREHENSIVE METABOLIC PANEL
ALBUMIN: 3.5 g/dL (ref 3.5–5.2)
ALT: 12 U/L (ref 0–35)
AST: 20 U/L (ref 0–37)
Alkaline Phosphatase: 59 U/L (ref 39–117)
BUN: 8 mg/dL (ref 6–23)
CO2: 24 mEq/L (ref 19–32)
CREATININE: 0.45 mg/dL — AB (ref 0.50–1.10)
Calcium: 8.6 mg/dL (ref 8.4–10.5)
Chloride: 94 mEq/L — ABNORMAL LOW (ref 96–112)
GFR calc Af Amer: >90 mL/min (ref >90)
GFR calc non Af Amer: >90 mL/min (ref >90)
Glucose, Bld: 105 mg/dL — ABNORMAL HIGH (ref 70–99)
Potassium: 3.8 mEq/L (ref 3.7–5.3)
Sodium: 132 mEq/L — ABNORMAL LOW (ref 137–147)
TOTAL PROTEIN: 6.9 g/dL (ref 6.0–8.3)
Total Bilirubin: 0.4 mg/dL (ref 0.3–1.2)

## 2013-10-10 MED ORDER — ONDANSETRON HCL 4 MG PO TABS: 4.0000 mg | ORAL_TABLET | Freq: Four times a day (QID) | ORAL | Status: DC

## 2013-10-10 MED ORDER — DEXTROSE 5 % IV SOLN
1.0000 g | Freq: Once | INTRAVENOUS | Status: AC
  Administered 2013-10-10: 1 g via INTRAVENOUS
  Filled 2013-10-10: qty 10

## 2013-10-10 MED ORDER — CEFPODOXIME PROXETIL 100 MG PO TABS: 100.0000 mg | ORAL_TABLET | Freq: Two times a day (BID) | ORAL | Status: DC

## 2013-10-10 MED ORDER — ONDANSETRON 4 MG PO TBDP
4.0000 mg | ORAL_TABLET | Freq: Once | ORAL | Status: AC
  Administered 2013-10-10: 4 mg via ORAL
  Filled 2013-10-10: qty 1

## 2013-10-10 NOTE — ED Notes (Signed)
PT ambulated with baseline gait; VSS; A&Ox3; no signs of distress; respirations even and unlabored; skin warm and dry; no questions upon discharge.  

## 2013-10-10 NOTE — ED Notes (Signed)
States she has blood in her urine

## 2013-10-10 NOTE — ED Provider Notes (Signed)
CSN: [id number]     Arrival date & time 10/09/13  2348 History   First MD Initiated Contact with Patient 10/10/13 0533     Chief Complaint  Patient presents with  . Abdominal Pain     (Consider location/radiation/quality/duration/timing/severity/associated sxs/prior Treatment) HPI History provided by patient. Presents with nausea vomiting diarrhea since last night. She also has burning with urination and feels like she has a urinary tract infection. History of UTI with similar symptoms. She denies any fevers. She does feel cold. No hematuria. No blood in emesis or stools. Currently nausea is controlled. Patient requesting antibiotics. She denies any abdominal pain otherwise. No back pain. Symptoms moderate in severity. Past Medical History  Diagnosis Date  . UTI (lower urinary tract infection)   . Asthma   . GERD (gastroesophageal reflux disease)    Past Surgical History  Procedure Laterality Date  . Abdominal surgery    . Abdominal hysterectomy     No family history on file. History  Substance Use Topics  . Smoking status: Never Smoker   . Smokeless tobacco: Never Used  . Alcohol Use: No   OB History   Grav Para Term Preterm Abortions TAB SAB Ect Mult Living                 Review of Systems  Constitutional: Negative for fever and chills.  Respiratory: Negative for shortness of breath.   Cardiovascular: Negative for chest pain.  Gastrointestinal: Positive for nausea, vomiting and diarrhea. Negative for blood in stool.  Genitourinary: Positive for dysuria.  Musculoskeletal: Negative for back pain, neck pain and neck stiffness.  Skin: Negative for rash.  Neurological: Negative for headaches.  All other systems reviewed and are negative.     Allergies  Contrast media and Other  Home Medications   Current Outpatient Rx  Name  Route  Sig  Dispense  Refill  . albuterol (PROVENTIL HFA;VENTOLIN HFA) 108 (90 BASE) MCG/ACT inhaler   Inhalation   Inhale 2 puffs into the  lungs every 6 (six) hours as needed for wheezing or shortness of breath.   1 Inhaler   2   . beclomethasone (QVAR) 80 MCG/ACT inhaler   Inhalation   Inhale 1 puff into the lungs 2 (two) times daily.         . budesonide-formoterol (SYMBICORT) 160-4.5 MCG/ACT inhaler   Inhalation   Inhale 2 puffs into the lungs 2 (two) times daily.   1 Inhaler   12   . ciprofloxacin (CIPRO) 500 MG tablet   Oral   Take 1 tablet (500 mg total) by mouth 2 (two) times daily. One po bid x 7 days   14 tablet   0   . HYDROcodone-acetaminophen (NORCO) 5-325 MG per tablet   Oral   Take 2 tablets by mouth every 4 (four) hours as needed.   20 tablet   0   . metroNIDAZOLE (FLAGYL) 500 MG tablet   Oral   Take 1 tablet (500 mg total) by mouth 3 (three) times daily. One po bid x 7 days   21 tablet   0   . Probiotic Product (ALIGN) 4 MG CAPS   Oral   Take 4 mg by mouth daily.         . simvastatin (ZOCOR) 10 MG tablet   Oral   Take 10 mg by mouth 3 (three) times a week. Take one tablet on Mondays, Wednesday, and fridays          BP 132/62  Pulse 70  Temp(Src) 98.2 F (36.8 C) (Oral)  Resp 16  Ht 5\' 4"  (1.626 m)  Wt 119 lb 9.6 oz (54.25 kg)  BMI 20.52 kg/m2  SpO2 99% Physical Exam  Nursing note and vitals reviewed. Constitutional: She is oriented to person, place, and time. She appears well-developed and well-nourished.  HENT:  Head: Normocephalic and atraumatic.  Mouth/Throat: Oropharynx is clear and moist.  Eyes: EOM are normal. Pupils are equal, round, and reactive to light.  Neck: Neck supple.  Cardiovascular: Normal rate, normal heart sounds and intact distal pulses.   Pulmonary/Chest: Effort normal and breath sounds normal. No respiratory distress. She exhibits no tenderness.  Abdominal: Soft. Bowel sounds are normal. She exhibits no distension. There is no tenderness. There is no rebound and no guarding.  No abdominal tenderness. No CVA tenderness  Musculoskeletal: Normal  range of motion. She exhibits no edema.  Neurological: She is alert and oriented to person, place, and time.  Skin: Skin is warm and dry.    ED Course  Procedures (including critical care time) Labs Review Labs Reviewed  CBC WITH DIFFERENTIAL - Abnormal; Notable for the following:    Hemoglobin 10.9 (*)    HCT 32.4 (*)    Eosinophils Relative 10 (*)    Eosinophils Absolute 1.0 (*)    All other components within normal limits  COMPREHENSIVE METABOLIC PANEL - Abnormal; Notable for the following:    Sodium 132 (*)    Chloride 94 (*)    Glucose, Bld 105 (*)    Creatinine, Ser 0.45 (*)    All other components within normal limits  URINALYSIS, ROUTINE W REFLEX MICROSCOPIC - Abnormal; Notable for the following:    APPearance CLOUDY (*)    Hgb urine dipstick LARGE (*)    Protein, ur 100 (*)    Leukocytes, UA LARGE (*)    All other components within normal limits  URINE MICROSCOPIC-ADD ON - Abnormal; Notable for the following:    Bacteria, UA FEW (*)    All other components within normal limits   By mouth Zofran provided IV Rocephin provided 7:31 AM drinking water without further nausea or vomiting. She requesting to be discharged home she is feeling much better.  Plan discharge home prescription for Vantin and Zofran.  Urine culture pending - followup primary care physician for urine culture results. Return to the emergency department for fevers, persistent vomiting or any worsening condition.  MDM   Diagnosis: UTI  Presents with nausea vomiting diarrhea and UTI symptoms. Patient complaining of bladder infection. She denies abdominal pain otherwise. Urinalysis reviewed as above. IV antibiotics and Zofran provided. Labs obtained and reviewed as above. No leukocytosis.    Sunnie Nielsen, MD 10/10/13 [phone]

## 2013-10-10 NOTE — ED Notes (Signed)
Provided water for patient to drink per Dr. Dierdre Highmanpitz request.

## 2013-10-10 NOTE — Discharge Instructions (Signed)
Urinary Tract Infection  Urinary tract infections (UTIs) can develop anywhere along your urinary tract. Your urinary tract is your body's drainage system for removing wastes and extra water. Your urinary tract includes two kidneys, two ureters, a bladder, and a urethra. Your kidneys are a pair of bean-shaped organs. Each kidney is about the size of your fist. They are located below your ribs, one on each side of your spine.  CAUSES  Infections are caused by microbes, which are microscopic organisms, including fungi, viruses, and bacteria. These organisms are so small that they can only be seen through a microscope. Bacteria are the microbes that most commonly cause UTIs.  SYMPTOMS   Symptoms of UTIs may vary by age and gender of the patient and by the location of the infection. Symptoms in young women typically include a frequent and intense urge to urinate and a painful, burning feeling in the bladder or urethra during urination. Older women and men are more likely to be tired, shaky, and weak and have muscle aches and abdominal pain. A fever may mean the infection is in your kidneys. Other symptoms of a kidney infection include pain in your back or sides below the ribs, nausea, and vomiting.  DIAGNOSIS  To diagnose a UTI, your caregiver will ask you about your symptoms. Your caregiver also will ask to provide a urine sample. The urine sample will be tested for bacteria and white blood cells. White blood cells are made by your body to help fight infection.  TREATMENT   Typically, UTIs can be treated with medication. Because most UTIs are caused by a bacterial infection, they usually can be treated with the use of antibiotics. The choice of antibiotic and length of treatment depend on your symptoms and the type of bacteria causing your infection.  HOME CARE INSTRUCTIONS   If you were prescribed antibiotics, take them exactly as your caregiver instructs you. Finish the medication even if you feel better after you  have only taken some of the medication.   Drink enough water and fluids to keep your urine clear or pale yellow.   Avoid caffeine, tea, and carbonated beverages. They tend to irritate your bladder.   Empty your bladder often. Avoid holding urine for long periods of time.   Empty your bladder before and after sexual intercourse.   After a bowel movement, women should cleanse from front to back. Use each tissue only once.  SEEK MEDICAL CARE IF:    You have back pain.   You develop a fever.   Your symptoms do not begin to resolve within 3 days.  SEEK IMMEDIATE MEDICAL CARE IF:    You have severe back pain or lower abdominal pain.   You develop chills.   You have nausea or vomiting.   You have continued burning or discomfort with urination.  MAKE SURE YOU:    Understand these instructions.   Will watch your condition.   Will get help right away if you are not doing well or get worse.  Document Released: 03/30/2005 Document Revised: 12/20/2011 Document Reviewed: 07/29/2011  Exit[employer] Patient Information 2014 [employer].

## 2013-10-10 NOTE — ED Notes (Signed)
PT c/o abdominal pain with N/V/D x 3 since 2100.

## 2013-10-10 NOTE — ED Notes (Signed)
Discussed with patient the need to void for urine sample collection.

## 2013-10-11 LAB — URINE CULTURE: Colony Count: 8000

## 2014-07-03 ENCOUNTER — Other Ambulatory Visit: Payer: Self-pay | Admitting: Gastroenterology

## 2014-07-09 ENCOUNTER — Other Ambulatory Visit: Payer: Self-pay | Admitting: Gastroenterology

## 2014-07-09 DIAGNOSIS — R1032 Left lower quadrant pain: Secondary | ICD-10-CM

## 2014-07-14 ENCOUNTER — Ambulatory Visit
Admission: RE | Admit: 2014-07-14 | Discharge: 2014-07-14 | Disposition: A | Discharge status: Home / Self Care | Payer: Self-pay | Source: Ambulatory Visit | Attending: Gastroenterology | Admitting: Gastroenterology

## 2014-07-14 ENCOUNTER — Other Ambulatory Visit: Payer: Self-pay | Admitting: Gastroenterology

## 2014-07-14 DIAGNOSIS — R1032 Left lower quadrant pain: Secondary | ICD-10-CM

## 2014-07-17 ENCOUNTER — Ambulatory Visit
Admission: RE | Admit: 2014-07-17 | Discharge: 2014-07-17 | Disposition: A | Discharge status: Home / Self Care | Payer: Medicare Other | Source: Ambulatory Visit | Attending: Gastroenterology | Admitting: Gastroenterology

## 2014-07-17 DIAGNOSIS — R1032 Left lower quadrant pain: Secondary | ICD-10-CM

## 2014-07-17 MED ORDER — IOHEXOL 300 MG/ML  SOLN
100.0000 mL | Freq: Once | INTRAMUSCULAR | Status: AC | PRN
  Administered 2014-07-17: 100 mL via INTRAVENOUS

## 2014-09-04 LAB — HM COLONOSCOPY

## 2014-10-24 ENCOUNTER — Emergency Department (INDEPENDENT_AMBULATORY_CARE_PROVIDER_SITE_OTHER)
Admission: EM | Admit: 2014-10-24 | Discharge: 2014-10-24 | Disposition: A | Discharge status: Home / Self Care | Payer: Medicare Other | Source: Home / Self Care | Attending: Family Medicine | Admitting: Family Medicine

## 2014-10-24 ENCOUNTER — Encounter (HOSPITAL_COMMUNITY): Payer: Self-pay | Admitting: Emergency Medicine

## 2014-10-24 DIAGNOSIS — T148 Other injury of unspecified body region: Secondary | ICD-10-CM | POA: Diagnosis not present

## 2014-10-24 DIAGNOSIS — T148XXA Other injury of unspecified body region, initial encounter: Secondary | ICD-10-CM

## 2014-10-24 LAB — POCT URINALYSIS DIP (DEVICE)
Bilirubin Urine: NEGATIVE
GLUCOSE, UA: NEGATIVE mg/dL
Ketones, ur: NEGATIVE mg/dL
Leukocytes, UA: NEGATIVE
NITRITE: NEGATIVE
Protein, ur: NEGATIVE mg/dL
SPECIFIC GRAVITY, URINE: 1.015 (ref 1.005–1.030)
Urobilinogen, UA: 0.2 mg/dL (ref 0.0–1.0)
pH: 7 (ref 5.0–8.0)

## 2014-10-24 LAB — CBC WITH DIFFERENTIAL/PLATELET
Basophils Absolute: 0.1 10*3/uL (ref 0.0–0.1)
Basophils Relative: 1 % (ref 0–1)
EOS ABS: 1.7 10*3/uL — AB (ref 0.0–0.7)
Eosinophils Relative: 21 % — ABNORMAL HIGH (ref 0–5)
HEMATOCRIT: 34.7 % — AB (ref 36.0–46.0)
Hemoglobin: 11.2 g/dL — ABNORMAL LOW (ref 12.0–15.0)
LYMPHS ABS: 2.8 10*3/uL (ref 0.7–4.0)
Lymphocytes Relative: 34 % (ref 12–46)
MCH: 25.5 pg — AB (ref 26.0–34.0)
MCHC: 32.3 g/dL (ref 30.0–36.0)
MCV: 79 fL (ref 78.0–100.0)
Monocytes Absolute: 0.3 10*3/uL (ref 0.1–1.0)
Monocytes Relative: 4 % (ref 3–12)
NEUTROS PCT: 40 % — AB (ref 43–77)
Neutro Abs: 3.2 10*3/uL (ref 1.7–7.7)
Platelets: 254 10*3/uL (ref 150–400)
RBC: 4.39 MIL/uL (ref 3.87–5.11)
RDW: 13.8 % (ref 11.5–15.5)
WBC: 8.1 10*3/uL (ref 4.0–10.5)

## 2014-10-24 LAB — POCT I-STAT, CHEM 8
BUN: 15 mg/dL (ref 6–23)
CREATININE: 0.7 mg/dL (ref 0.50–1.10)
Calcium, Ion: 1.15 mmol/L (ref 1.13–1.30)
Chloride: 99 mmol/L (ref 96–112)
Glucose, Bld: 118 mg/dL — ABNORMAL HIGH (ref 70–99)
HCT: 39 % (ref 36.0–46.0)
HEMOGLOBIN: 13.3 g/dL (ref 12.0–15.0)
Potassium: 4.2 mmol/L (ref 3.5–5.1)
SODIUM: 138 mmol/L (ref 135–145)
TCO2: 27 mmol/L (ref 0–100)

## 2014-10-24 NOTE — ED Notes (Signed)
Patient c/o random bruises all over her body x 2 weeks. Patient is unsure how she got bruises. They are on her legs, buttock, and feet. Patient is in no pain and NAD.

## 2014-10-24 NOTE — ED Provider Notes (Signed)
CSN: [id number]     Arrival date & time 10/24/14  1513 History   First MD Initiated Contact with Patient 10/24/14 1549     Chief Complaint  Patient presents with  . Bleeding/Bruising   (Consider location/radiation/quality/duration/timing/severity/associated sxs/prior Treatment) HPI     74 year old female presents for evaluation of bruising. For the past 2 weeks she has been experiencing random bruises all over her body, primarily on her lower extremities. She denies any trauma or any inciting incidents. She has never had this before. She says the bruises are occasionally itchy. She also gets them on her hands and fingers. All other review systems are negative  Past Medical History  Diagnosis Date  . UTI (lower urinary tract infection)   . Asthma   . GERD (gastroesophageal reflux disease)    Past Surgical History  Procedure Laterality Date  . Abdominal surgery    . Abdominal hysterectomy     No family history on file. History  Substance Use Topics  . Smoking status: Never Smoker   . Smokeless tobacco: Never Used  . Alcohol Use: No   OB History    No data available     Review of Systems  Gastrointestinal:       Dark stools  Hematological: Bruises/bleeds easily.  All other systems reviewed and are negative.   Allergies  Contrast media and Other  Home Medications   Prior to Admission medications   Medication Sig Start Date End Date Taking? Authorizing Provider  atorvastatin (LIPITOR) 10 MG tablet Take 10 mg by mouth daily.   Yes Historical Provider, MD  imipramine (TOFRANIL) 50 MG tablet Take 50 mg by mouth at bedtime.   Yes Historical Provider, MD  albuterol (PROVENTIL HFA;VENTOLIN HFA) 108 (90 BASE) MCG/ACT inhaler Inhale 2 puffs into the lungs every 6 (six) hours as needed for wheezing or shortness of breath. 07/01/13   Rodolph Bong, MD  budesonide-formoterol [facility]) 160-4.5 MCG/ACT inhaler Inhale 2 puffs into the lungs 2 (two) times daily. 12/22/12   Vassie Loll,  MD  cefpodoxime (VANTIN) 100 MG tablet Take 1 tablet (100 mg total) by mouth 2 (two) times daily. 10/10/13   Sunnie Nielsen, MD  ciclesonide (ALVESCO) 80 MCG/ACT inhaler Inhale 1 puff into the lungs 2 (two) times daily.    Historical Provider, MD  ondansetron (ZOFRAN) 4 MG tablet Take 1 tablet (4 mg total) by mouth every 6 (six) hours. 10/10/13   Sunnie Nielsen, MD  simvastatin (ZOCOR) 10 MG tablet Take 10 mg by mouth 3 (three) times a week. Take one tablet on Mondays, Wednesday, and fridays    Historical Provider, MD   BP 129/73 mmHg  Pulse 71  Temp(Src) 97.9 F (36.6 C) (Oral)  Resp 18  SpO2 100% Physical Exam  Constitutional: She is oriented to person, place, and time. Vital signs are normal. She appears well-developed and well-nourished. No distress.  HENT:  Head: Normocephalic and atraumatic.  Cardiovascular: Normal rate, regular rhythm and normal heart sounds.   Pulmonary/Chest: Effort normal and breath sounds normal. No respiratory distress.  Abdominal: Soft. She exhibits no distension and no mass. There is no hepatosplenomegaly. There is no tenderness. There is no rebound and no guarding. No hernia.  Neurological: She is alert and oriented to person, place, and time. She has normal strength. Coordination normal.  Skin: Skin is warm and dry. Ecchymosis noted. No rash noted. She is not diaphoretic.  Multiple bruises, primarily on the lower extremity, including the palms and soles, and fingers  Psychiatric: She has a normal mood and affect. Judgment normal.  Nursing note and vitals reviewed.   ED Course  Procedures (including critical care time) Labs Review Labs Reviewed  CBC WITH DIFFERENTIAL/PLATELET - Abnormal; Notable for the following:    Hemoglobin 11.2 (*)    HCT 34.7 (*)    MCH 25.5 (*)    Neutrophils Relative % 40 (*)    Eosinophils Relative 21 (*)    Eosinophils Absolute 1.7 (*)    All other components within normal limits  POCT URINALYSIS DIP (DEVICE) - Abnormal; Notable  for the following:    Hgb urine dipstick TRACE (*)    All other components within normal limits  POCT I-STAT, CHEM 8 - Abnormal; Notable for the following:    Glucose, Bld 118 (*)    All other components within normal limits    Imaging Review No results found.   MDM   1. Bruising     Unclear etiology.  F/u with PCP.  ED if worsening    Graylon GoodZachary H Harlen Danford, PA-C 10/24/14 1702

## 2014-10-24 NOTE — Discharge Instructions (Signed)
Follow-up with her primary care provider for further evaluation of this problem. If this gets worse or you start having bleeding, go to the emergency department

## 2014-10-26 ENCOUNTER — Emergency Department (HOSPITAL_COMMUNITY)
Admission: EM | Admit: 2014-10-26 | Discharge: 2014-10-26 | Disposition: A | Discharge status: Home / Self Care | Payer: Medicare Other | Attending: Emergency Medicine | Admitting: Emergency Medicine

## 2014-10-26 ENCOUNTER — Encounter (HOSPITAL_COMMUNITY): Payer: Self-pay | Admitting: Emergency Medicine

## 2014-10-26 DIAGNOSIS — J45909 Unspecified asthma, uncomplicated: Secondary | ICD-10-CM | POA: Insufficient documentation

## 2014-10-26 DIAGNOSIS — Z87440 Personal history of urinary (tract) infections: Secondary | ICD-10-CM | POA: Insufficient documentation

## 2014-10-26 DIAGNOSIS — Z792 Long term (current) use of antibiotics: Secondary | ICD-10-CM | POA: Insufficient documentation

## 2014-10-26 DIAGNOSIS — Z7951 Long term (current) use of inhaled steroids: Secondary | ICD-10-CM | POA: Insufficient documentation

## 2014-10-26 DIAGNOSIS — Z8719 Personal history of other diseases of the digestive system: Secondary | ICD-10-CM | POA: Insufficient documentation

## 2014-10-26 DIAGNOSIS — M7981 Nontraumatic hematoma of soft tissue: Secondary | ICD-10-CM | POA: Diagnosis not present

## 2014-10-26 DIAGNOSIS — T148XXA Other injury of unspecified body region, initial encounter: Secondary | ICD-10-CM

## 2014-10-26 DIAGNOSIS — Z79899 Other long term (current) drug therapy: Secondary | ICD-10-CM | POA: Insufficient documentation

## 2014-10-26 HISTORY — DX: Pure hypercholesterolemia, unspecified: E78.00

## 2014-10-26 LAB — COMPREHENSIVE METABOLIC PANEL
ALT: 13 U/L (ref 0–35)
ANION GAP: 6 (ref 5–15)
AST: 17 U/L (ref 0–37)
Albumin: 3.7 g/dL (ref 3.5–5.2)
Alkaline Phosphatase: 56 U/L (ref 39–117)
BUN: 7 mg/dL (ref 6–23)
CHLORIDE: 101 mmol/L (ref 96–112)
CO2: 28 mmol/L (ref 19–32)
CREATININE: 0.48 mg/dL — AB (ref 0.50–1.10)
Calcium: 8.9 mg/dL (ref 8.4–10.5)
GFR calc non Af Amer: >90 mL/min (ref >90)
Glucose, Bld: 94 mg/dL (ref 70–99)
Potassium: 4.5 mmol/L (ref 3.5–5.1)
Sodium: 135 mmol/L (ref 135–145)
Total Bilirubin: 0.5 mg/dL (ref 0.3–1.2)
Total Protein: 7.5 g/dL (ref 6.0–8.3)

## 2014-10-26 LAB — URINALYSIS, ROUTINE W REFLEX MICROSCOPIC
BILIRUBIN URINE: NEGATIVE
GLUCOSE, UA: NEGATIVE mg/dL
Hgb urine dipstick: NEGATIVE
Ketones, ur: NEGATIVE mg/dL
LEUKOCYTES UA: NEGATIVE
Nitrite: NEGATIVE
Protein, ur: NEGATIVE mg/dL
Specific Gravity, Urine: 1.011 (ref 1.005–1.030)
Urobilinogen, UA: 0.2 mg/dL (ref 0.0–1.0)
pH: 8.5 — ABNORMAL HIGH (ref 5.0–8.0)

## 2014-10-26 LAB — CBC WITH DIFFERENTIAL/PLATELET
BASOS ABS: 0.1 10*3/uL (ref 0.0–0.1)
Basophils Relative: 1 % (ref 0–1)
EOS PCT: 24 % — AB (ref 0–5)
Eosinophils Absolute: 1.6 10*3/uL — ABNORMAL HIGH (ref 0.0–0.7)
HCT: 35.8 % — ABNORMAL LOW (ref 36.0–46.0)
HEMOGLOBIN: 11.5 g/dL — AB (ref 12.0–15.0)
Lymphocytes Relative: 32 % (ref 12–46)
Lymphs Abs: 2.1 10*3/uL (ref 0.7–4.0)
MCH: 25.4 pg — AB (ref 26.0–34.0)
MCHC: 32.1 g/dL (ref 30.0–36.0)
MCV: 79.2 fL (ref 78.0–100.0)
Monocytes Absolute: 0.2 10*3/uL (ref 0.1–1.0)
Monocytes Relative: 4 % (ref 3–12)
NEUTROS ABS: 2.6 10*3/uL (ref 1.7–7.7)
NEUTROS PCT: 39 % — AB (ref 43–77)
Platelets: 252 10*3/uL (ref 150–400)
RBC: 4.52 MIL/uL (ref 3.87–5.11)
RDW: 13.9 % (ref 11.5–15.5)
WBC: 6.6 10*3/uL (ref 4.0–10.5)

## 2014-10-26 LAB — PROTIME-INR
INR: 1.02 (ref 0.00–1.49)
PROTHROMBIN TIME: 13.5 s (ref 11.6–15.2)

## 2014-10-26 LAB — I-STAT TROPONIN, ED: Troponin i, poc: 0 ng/mL (ref 0.00–0.08)

## 2014-10-26 LAB — POC OCCULT BLOOD, ED: Fecal Occult Bld: NEGATIVE

## 2014-10-26 LAB — APTT: aPTT: 36 seconds (ref 24–37)

## 2014-10-26 NOTE — Discharge Instructions (Signed)
Please make appointment to see her primary care provider. If your symptoms continue may need to see Dr. Mosetta PuttFeng who is a hematologist.   Contusion A contusion is a deep bruise. Contusions are the result of an injury that caused bleeding under the skin. The contusion may turn blue, purple, or yellow. Minor injuries will give you a painless contusion, but more severe contusions may stay painful and swollen for a few weeks.  CAUSES  A contusion is usually caused by a blow, trauma, or direct force to an area of the body. SYMPTOMS   Swelling and redness of the injured area.  Bruising of the injured area.  Tenderness and soreness of the injured area.  Pain. DIAGNOSIS  The diagnosis can be made by taking a history and physical exam. An X-ray, CT scan, or MRI may be needed to determine if there were any associated injuries, such as fractures. TREATMENT  Specific treatment will depend on what area of the body was injured. In general, the best treatment for a contusion is resting, icing, elevating, and applying cold compresses to the injured area. Over-the-counter medicines may also be recommended for pain control. Ask your caregiver what the best treatment is for your contusion. HOME CARE INSTRUCTIONS   Put ice on the injured area.  Put ice in a plastic bag.  Place a towel between your skin and the bag.  Leave the ice on for 15-20 minutes, 3-4 times a day, or as directed by your health care provider.  Only take over-the-counter or prescription medicines for pain, discomfort, or fever as directed by your caregiver. Your caregiver may recommend avoiding anti-inflammatory medicines (aspirin, ibuprofen, and naproxen) for 48 hours because these medicines may increase bruising.  Rest the injured area.  If possible, elevate the injured area to reduce swelling. SEEK IMMEDIATE MEDICAL CARE IF:   You have increased bruising or swelling.  You have pain that is getting worse.  Your swelling or pain  is not relieved with medicines. MAKE SURE YOU:   Understand these instructions.  Will watch your condition.  Will get help right away if you are not doing well or get worse. Document Released: 03/30/2005 Document Revised: 06/25/2013 Document Reviewed: 04/25/2011 [facility] Patient Information 2015 [place], [place]. This information is not intended to replace advice given to you by your health care provider. Make sure you discuss any questions you have with your health care provider.

## 2014-10-26 NOTE — ED Notes (Addendum)
Waiting for interpreter service to find her dialect; some English. Reports bruising to legs and bottom of feet for a couple weeks. Denies injury or hitting legs. Denies anyone hurting her. Denies bleeding from nose. Reports black stool off and on for years. Denies taking blood thinners. Reports feeling tired, nauseous.

## 2014-10-26 NOTE — ED Provider Notes (Signed)
TIME SEEN: 10:10 AM  CHIEF COMPLAINT: Bruising  HPI: Pt is a 74 y.o. female with history of asthma, hyperlipidemia who presents to the emergency department with bruising to her bilateral lower extremities for the past 2 weeks. Denies any known injury, trauma. States that the bruises are painful with palpation and at times they are pruritic. She states that she feels that she has had subjective fevers at home and has not measured her temperature. Is afebrile in the emergency department. Reports yesterday she had an episode where she felt sweaty and nauseated but no chest pain or shortness of breath. She denies any bleeding from her gums, epistaxis. States she has had dark stools for many years intermittently. No blood in her stool. Not on anticoagulation. Has never had similar symptoms before. Denies any history of any clotting disorders. No weight loss, night sweats. No history of cancer. No headache or neck pain. No other rash. No hematuria. No vaginal bleeding.  ROS: See HPI Constitutional: Subjective fever  Eyes: no drainage  ENT: no runny nose   Cardiovascular:  no chest pain  Resp: no SOB  GI: no vomiting GU: no dysuria Integumentary: no rash  Allergy: no hives  Musculoskeletal: no leg swelling  Neurological: no slurred speech ROS otherwise negative  PAST MEDICAL HISTORY/PAST SURGICAL HISTORY:  Past Medical History  Diagnosis Date  . UTI (lower urinary tract infection)   . Asthma   . GERD (gastroesophageal reflux disease)     MEDICATIONS:  Prior to Admission medications   Medication Sig Start Date End Date Taking? Authorizing Provider  albuterol (PROVENTIL HFA;VENTOLIN HFA) 108 (90 BASE) MCG/ACT inhaler Inhale 2 puffs into the lungs every 6 (six) hours as needed for wheezing or shortness of breath. 07/01/13   Rodolph Bong, MD  atorvastatin (LIPITOR) 10 MG tablet Take 10 mg by mouth daily.    Historical Provider, MD  budesonide-formoterol (SYMBICORT) 160-4.5 MCG/ACT inhaler  Inhale 2 puffs into the lungs 2 (two) times daily. 12/22/12   Vassie Loll, MD  cefpodoxime (VANTIN) 100 MG tablet Take 1 tablet (100 mg total) by mouth 2 (two) times daily. 10/10/13   Sunnie Nielsen, MD  ciclesonide (ALVESCO) 80 MCG/ACT inhaler Inhale 1 puff into the lungs 2 (two) times daily.    Historical Provider, MD  imipramine (TOFRANIL) 50 MG tablet Take 50 mg by mouth at bedtime.    Historical Provider, MD  ondansetron (ZOFRAN) 4 MG tablet Take 1 tablet (4 mg total) by mouth every 6 (six) hours. 10/10/13   Sunnie Nielsen, MD  simvastatin (ZOCOR) 10 MG tablet Take 10 mg by mouth 3 (three) times a week. Take one tablet on Mondays, Wednesday, and fridays    Historical Provider, MD    ALLERGIES:  Allergies  Allergen Reactions  . Contrast Media [Iodinated Diagnostic Agents] Other (See Comments)    Patient breaks out in hives pt has had ct scans in the past with premeds and has not had reaction.  . Other Nausea And Vomiting    Seafood allergy-rash    SOCIAL HISTORY:  History  Substance Use Topics  . Smoking status: Never Smoker   . Smokeless tobacco: Never Used  . Alcohol Use: No    FAMILY HISTORY: No family history on file.  EXAM: BP 136/67 mmHg  Pulse 69  Temp(Src) 98 F (36.7 C) (Oral)  Resp 21  Ht  (1.626 m)  Wt 119 lb (53.978 kg)  BMI 20.42 kg/m2  SpO2 100% CONSTITUTIONAL: Alert and oriented and responds  appropriately to questions. Well-appearing; well-nourished, pleasant, nontoxic HEAD: Normocephalic EYES: Conjunctivae clear, PERRL, no conjunctival pallor ENT: normal nose; no rhinorrhea; moist mucous membranes; pharynx without lesions noted, no bleeding from the gums, no epistaxis NECK: Supple, no meningismus, no LAD  CARD: RRR; S1 and S2 appreciated; no murmurs, no clicks, no rubs, no gallops RESP: Normal chest excursion without splinting or tachypnea; breath sounds clear and equal bilaterally; no wheezes, no rhonchi, no rales, no hypoxia or respiratory  distress ABD/GI: Normal bowel sounds; non-distended; soft, non-tender, no rebound, no guarding RECTAL: BACK:  The back appears normal and is non-tender to palpation, there is no CVA tenderness EXT: Normal ROM in all joints; non-tender to palpation; no edema; normal capillary refill; no cyanosis    SKIN: Normal color for age and race; warm, areas of ecchymosis to the bilateral lower extremities and one lesion to the sole of the left foot. She also has one area of ecchymosis to the left before meals and her upper extremity but this is likely from blood draw 2 days ago., No petechiae. NEURO: Moves all extremities equally PSYCH: The patient's mood and manner are appropriate. Grooming and personal hygiene are appropriate.  MEDICAL DECISION MAKING: Patient here with bruising to her bilateral lower extremities for 2 weeks. Otherwise no easy bruising, bleeding. Reports she has had dark stools on and off for several years but has no melena or bright red blood per rectum currently.  Was seen in urgent care 2 days ago and had normal hemoglobin, no thrombocytopenia. We'll repeat some blood work today, Hemoccult of her stool, urinalysis. She is hemodynamically stable and afebrile. Anticipate if workup is unremarkable that she will need to follow-up with her primary care provider Dorothyann Pengobyn Sanders and may need referral to a hematologist.  ED PROGRESS: Patient's labs are unremarkable.  No thrombocytopenia. Coags are normal. LFTs normal. Hemoglobin stable. No hematuria. She is guaiac negative. Unclear etiology for patient's lower extremity bruising but I feel she is safe to be discharged home with outpatient PCP and hematology follow-up. Discussed this with family and discussed return precautions including increasing bruising or bleeding, fevers, night sweats, weight loss. They verbalize understanding and are comfortable with plan.    EKG Interpretation  Date/Time:  Sunday October 26 2014 10:16:48 EDT Ventricular Rate:   66 PR Interval:  149 QRS Duration: 93 QT Interval:  410 QTC Calculation: 430 R Axis:   78 Text Interpretation:  Sinus rhythm Low voltage, precordial leads Baseline wander in lead(s) V6 No significant change since last tracing Confirmed by Yoneko Talerico,  DO, Oleg Oleson [phone]([id number]) on 10/26/2014 10:30:29 AM        Samantha MawKristen N Salvator Seppala, DO 10/26/14 1255

## 2015-03-10 ENCOUNTER — Other Ambulatory Visit: Payer: Self-pay

## 2015-03-10 DIAGNOSIS — I83893 Varicose veins of bilateral lower extremities with other complications: Secondary | ICD-10-CM

## 2015-03-10 DIAGNOSIS — I8312 Varicose veins of left lower extremity with inflammation: Secondary | ICD-10-CM

## 2015-04-13 DIAGNOSIS — R1033 Periumbilical pain: Secondary | ICD-10-CM | POA: Insufficient documentation

## 2015-04-19 ENCOUNTER — Emergency Department (INDEPENDENT_AMBULATORY_CARE_PROVIDER_SITE_OTHER)
Admission: EM | Admit: 2015-04-19 | Discharge: 2015-04-19 | Disposition: A | Discharge status: Home / Self Care | Payer: Commercial Managed Care - HMO | Source: Home / Self Care | Attending: Family Medicine | Admitting: Family Medicine

## 2015-04-19 ENCOUNTER — Encounter (HOSPITAL_COMMUNITY): Payer: Self-pay | Admitting: *Deleted

## 2015-04-19 DIAGNOSIS — K2970 Gastritis, unspecified, without bleeding: Secondary | ICD-10-CM

## 2015-04-19 HISTORY — DX: Other specified bacterial intestinal infections: A04.8

## 2015-04-19 MED ORDER — GI COCKTAIL ~~LOC~~
ORAL | Status: AC
  Filled 2015-04-19: qty 30

## 2015-04-19 MED ORDER — GI COCKTAIL ~~LOC~~
30.0000 mL | Freq: Once | ORAL | Status: AC
  Administered 2015-04-19: 30 mL via ORAL

## 2015-04-19 NOTE — Discharge Instructions (Signed)
I believe the problem came from a clarithromycin. Do not take anymore of this medicine since it is very difficult to take. Instead, take Maalox twice a day and get a probiotic (culturelle or align) for the next two days   Vim D? Dy, Ng??i L?n (Gastritis, Adult) Vim d? dy l hi?n t??ng ?au nh?c v s?ng (vim) ? l?p nim m?c d? dy. Vim d? dy c th? pht tri?n nh? l m?t tnh tr?ng kh?i pht ??t ng?t (c?p tnh) ho?c ko di (m?n tnh). N?u vim d? dy khng ???c ?i?u tr?, b?nh c th? d?n ??n ch?y mu v lot d? dy.  NGUYN NHN Vim d? dy xu?t hi?n khi l?p nim m?c d? dy y?u ho?c b? t?n th??ng. Sau ?, d?ch tiu ha t? d? dy gy vim nim m?c d? dy b? suy y?u. Nim m?c d? dy c th? y?u ho?c b? t?n th??ng do nhi?m vi rt ho?c vi khu?n. M?t nhi?m trng do vi khu?n ph? bi?n l nhi?m trng do Helicobacter pylori. Vim d? dy c?ng c th? do u?ng r??u qu nhi?u, dng m?t s? lo?i thu?c nh?t ??nh ho?c c qu nhi?u axit trong d? dy. TRI?U CH?NG Trong m?t s? tr??ng h?p, khng c tri?u ch?ng. Khi c tri?u ch?ng, chng c th? bao g?m:  ?au ho?c c?m gic nng rt ? vng b?ng trn.  Bu?n nn.  Nn.  C?m gic kh ch?u do no sau khi ?n. CH?N ?ON Chuyn gia ch?m [place] s?c kh?e c th? nghi ng? b?n b? vim d? dy d?a vo cc tri?u ch?ng c?a b?n v khm th?c th?. ?? xc ??nh nguyn nhn vim d? dy, chuyn gia ch?m [place] s?c kh?e c th? th?c hi?n nh? sau:  Xt nghi?m mu ho?c phn ?? ki?m tra vi khu?n H pylori.  Soi d? dy. M?t ?ng m?ng, m?m (?n n?i soi) ???c lu?n xu?ng th?c qu?n v vo d? dy. ?n n?i soi c g?n ?n v camera ? ??u. Chuyn gia ch?m [place] s?c kh?e s? d?ng ?n n?i soi ?? xem bn trong d? dy.  L?y m?u m (sinh thi?t) t? d? dy ?? ki?m tra d??i knh hi?n vi. ?I?U TR? Ty thu?c vo nguyn nhn gy vim d? dy, thu?c c th? ???c k ??n. N?u b?n b? nhi?m trng do vi khu?n, ch?ng h?n nh? nhi?m trng do H pylori, khng sinh c th? ???c cho dng. N?u vim d? dy l do c qu nhi?u axit trong  d? dy, thu?c ch?n H2 ho?c thu?c trung ha axit c th? ???c cho dng. Chuyn gia ch?m [place] s?c kh?e c th? khuy?n ngh? b?n nn ng?ng dng atpirin, ibuprofen ho?c thu?c khng vim khng c steroid khc (NSAID). H??NG D?N CH?M [place] T?I NH  Ch? s? d?ng thu?c khng c?n k toa ho?c thu?c c?n k toa theo ch? d?n c?a chuyn gia ch?m [place] s?c kh?e.  N?u b?n ???c cho dng thu?c khng sinh, hy dng theo ch? d?n. Dng h?t thu?c ngay c? khi b?n b?t ??u c?m th?y ?? h?n.  U?ng ?? n??c ?? gi? cho n??c ti?u trong ho?c vng nh?t.  [place] cc lo?i th?c ph?m v ?? u?ng lm cho cc tri?u ch?ng t?i t? h?n, ch?ng h?n nh?:  Caffeine ho?c ?? u?ng c c?n.  S c la.  B?c h ho?c v? b?c h.  T?i v hnh ty.  Th?c ?n [place].  Tri cy h? cam, ch?ng h?n nh? cam, chanh hay chanh ty.  Cc th?c ?n c c  chua, ch?ng h?n nh? n??c x?t, ?t, salsa (n??c x?t [place]) v bnh pizza.  Cc lo?i th?c ?n chin xo v nhi?u ch?t bo.  ?n nh?ng b?a ?n nh?, th??ng xuyn h?n thay v cc b?a ?n l?n. HY NGAY L?P T?C ?I KHM N?U:  Phn c mu ?en ho?c ?? s?m.  B?n nn ra mu ho?c ch?t gi?ng nh? b?t c ph.  B?n khng th? gi? ch?t l?ng trong ng??i.  B?n b? ?au b?ng tr?m tr?ng h?n.  B?n b? s?t.  B?n khng c?m th?y kh?e h?n sau 1 tu?n.  B?n c b?t c? cu h?i ho?c th?c m?c no khc. ??M B?O B?N:  Hi?u cc h??ng d?n ny.  S? theo di tnh tr?ng c?a mnh.  S? yu c?u tr? gip ngay l?p t?c n?u b?n c?m th?y khng ?? ho?c tnh tr?ng tr?m tr?ng h?n.   Thng tin ny khng nh?m m?c ?ch thay th? cho l?i khuyn m chuyn gia ch?m [place] s?c kh?e ni v?i qu v?. Hy b?o ??m qu v? ph?i th?o lu?n b?t k? v?n ?? g m qu v? c v?i chuyn gia ch?m [place] s?c kh?e c?a qu v?.   Document Released: 06/20/2005 Document Revised: 02/20/2013 Elsevier Interactive Patient Education [employer]2016 Elsevier Inc.

## 2015-04-19 NOTE — ED Notes (Signed)
C/O epigastric pain that started approx 30 min after eating today.  Has experienced same pain over past 2 days, but pain usually dissipates after approx 1 hr; today pain not going away.  Started clindamycin for h. Pylori on 10/12.  Denies fevers, n/v/d.  Last BM yesterday - states was normal.

## 2015-04-19 NOTE — ED Provider Notes (Signed)
CSN: [id number]     Arrival date & time 04/19/15  1535 History   First MD Initiated Contact with Patient 04/19/15 1610     No chief complaint on file.  (Consider location/radiation/quality/duration/timing/severity/associated sxs/prior Treatment) The history is provided by the patient and a relative.   This is a 74 year old [place] woman who comes in with acute abdominal pain in her epigastrium. She has been treated in [place] recently and found to have a positive H. pylori test. She was started on clarithromycin. She's been on this for a week now.  On Friday she had some epigastric pain but it resolved spontaneously. Today being at 1:30, the pain returned and she's had significant sharp epigastric pain ever since.  She's had no vomiting and no fever Past Medical History  Diagnosis Date  . UTI (lower urinary tract infection)   . Asthma   . GERD (gastroesophageal reflux disease)   . Hypercholesteremia    Past Surgical History  Procedure Laterality Date  . Abdominal surgery    . Abdominal hysterectomy     No family history on file. Social History  Substance Use Topics  . Smoking status: Never Smoker   . Smokeless tobacco: Never Used  . Alcohol Use: No   OB History    No data available     Review of Systems  Constitutional: Negative.   HENT: Negative.   Eyes: Negative.   Respiratory: Negative.   Genitourinary: Negative.   Neurological: Negative.     Allergies  Contrast media and Other  Home Medications   Prior to Admission medications   Medication Sig Start Date End Date Taking? Authorizing Provider  albuterol (PROVENTIL HFA;VENTOLIN HFA) 108 (90 BASE) MCG/ACT inhaler Inhale 2 puffs into the lungs every 6 (six) hours as needed for wheezing or shortness of breath. Patient not taking: Reported on 10/26/2014 07/01/13   Rodolph Bong, MD  atorvastatin (LIPITOR) 10 MG tablet Take 10 mg by mouth daily.    Historical Provider, MD  budesonide-formoterol (SYMBICORT)  160-4.5 MCG/ACT inhaler Inhale 2 puffs into the lungs 2 (two) times daily. 12/22/12   Vassie Loll, MD  cefpodoxime (VANTIN) 100 MG tablet Take 1 tablet (100 mg total) by mouth 2 (two) times daily. Patient not taking: Reported on 10/26/2014 10/10/13   Sunnie Nielsen, MD  imipramine (TOFRANIL) 50 MG tablet Take 50 mg by mouth at bedtime.    Historical Provider, MD  ondansetron (ZOFRAN) 4 MG tablet Take 1 tablet (4 mg total) by mouth every 6 (six) hours. Patient not taking: Reported on 10/26/2014 10/10/13   Sunnie Nielsen, MD   Meds Ordered and Administered this Visit  Medications - No data to display  There were no vitals taken for this visit. No data found.   Physical Exam  Constitutional: She appears well-developed and well-nourished.  HENT:  Head: Normocephalic and atraumatic.  Eyes: Conjunctivae are normal. Pupils are equal, round, and reactive to light.  Neck: Neck supple.  Cardiovascular: Normal rate.   Pulmonary/Chest: Effort normal.  Abdominal: There is tenderness.  Musculoskeletal: Normal range of motion.  Neurological: She is alert.  Skin: Skin is warm and dry.  Psychiatric:  Patient very uncomfortable holding her epigastrium and at times writhing in pain.  Nursing note and vitals reviewed.   ED Course  Procedures (including critical care time) After the GI cocktail, patient much more comfortable and her pain was largely resolved. Her abdomen was reexamined and she had no tenderness, no masses, no rebound     MDM  ICD-9-CM ICD-10-CM   1. Gastritis 535.50 K29.70    I have recommended patient get Maalox, stop the clarithromycin, and take a probiotic Signed, Elvina SidleKurt Kabria Hetzer, MD     Elvina SidleKurt Maxamilian Amadon, MD 04/19/15 [phone]

## 2015-04-29 ENCOUNTER — Encounter: Payer: Self-pay | Admitting: Vascular Surgery

## 2015-05-01 ENCOUNTER — Ambulatory Visit (HOSPITAL_COMMUNITY)
Admission: RE | Admit: 2015-05-01 | Discharge: 2015-05-01 | Disposition: A | Discharge status: Home / Self Care | Payer: Commercial Managed Care - HMO | Source: Ambulatory Visit | Attending: Vascular Surgery | Admitting: Vascular Surgery

## 2015-05-01 ENCOUNTER — Encounter: Payer: Self-pay | Admitting: Vascular Surgery

## 2015-05-01 ENCOUNTER — Ambulatory Visit (INDEPENDENT_AMBULATORY_CARE_PROVIDER_SITE_OTHER): Payer: Commercial Managed Care - HMO | Admitting: Vascular Surgery

## 2015-05-01 VITALS — BP 119/70 | HR 62 | Ht 64.0 in | Wt 114.6 lb

## 2015-05-01 DIAGNOSIS — I872 Venous insufficiency (chronic) (peripheral): Secondary | ICD-10-CM | POA: Insufficient documentation

## 2015-05-01 DIAGNOSIS — I8312 Varicose veins of left lower extremity with inflammation: Secondary | ICD-10-CM | POA: Diagnosis not present

## 2015-05-01 DIAGNOSIS — I83892 Varicose veins of left lower extremities with other complications: Secondary | ICD-10-CM | POA: Diagnosis not present

## 2015-05-01 DIAGNOSIS — I83893 Varicose veins of bilateral lower extremities with other complications: Secondary | ICD-10-CM | POA: Insufficient documentation

## 2015-05-01 DIAGNOSIS — I83899 Varicose veins of unspecified lower extremities with other complications: Secondary | ICD-10-CM | POA: Insufficient documentation

## 2015-05-01 NOTE — Progress Notes (Signed)
Referred by:  Dorothyann Pengobyn Sanders, MD [address] [place] [id number]  Reason for referral: varicose veins   History of Present Illness  Samantha [name] is a 74 y.o. (30-Aug-1940) female who presents with chief complaint: episode purples spots on extremities.  Several weeks ago, this patient sudden onset of viral sx and purple knots on arms and legs.  This was self limiting and most of the knots resolved.  This patient was seen by his PCP and was refer to us to see if this was due to her varicosities.  Patient notes, long standing history of leg swelling.  This is variable with some days without swelling and somedays with significant bilateral calf swelling.  The patient denies any bursting sensation or pruritus.  The patient has had no history of DVT, known history of pregnancy, known history of varicose vein, no history of venous stasis ulcers, no history of  Lymphedema and no history of skin changes in lower legs.  There is no family history of venous disorders.  The patient has never used compression stockings in the past.   Past Medical History  Diagnosis Date  . UTI (lower urinary tract infection)   . Asthma   . GERD (gastroesophageal reflux disease)   . Hypercholesteremia   . H. pylori infection     Past Surgical History  Procedure Laterality Date  . Abdominal surgery    . Abdominal hysterectomy    . Cesarean section      Social History   Social History  . Marital Status: Single    Spouse Name: N/A  . Number of Children: N/A  . Years of Education: N/A   Occupational History  . Not on file.   Social History Main Topics  . Smoking status: Never Smoker   . Smokeless tobacco: Never Used  . Alcohol Use: No  . Drug Use: No  . Sexual Activity: Not on file   Other Topics Concern  . Not on file   Social History Narrative    Family History: patient unable to note any significant family medical problems  Current Outpatient Prescriptions  Medication Sig  Dispense Refill  . albuterol (PROVENTIL HFA;VENTOLIN HFA) 108 (90 BASE) MCG/ACT inhaler Inhale 2 puffs into the lungs every 6 (six) hours as needed for wheezing or shortness of breath. 1 Inhaler 2  . atorvastatin (LIPITOR) 10 MG tablet Take 10 mg by mouth daily.    . budesonide-formoterol (SYMBICORT) 160-4.5 MCG/ACT inhaler Inhale 2 puffs into the lungs 2 (two) times daily. 1 Inhaler 12  . clarithromycin (BIAXIN) 500 MG tablet Take 500 mg by mouth 2 (two) times daily.    Marland Kitchen. imipramine (TOFRANIL) 50 MG tablet Take 50 mg by mouth at bedtime.    . mirtazapine (REMERON) 15 MG tablet Take 15 mg by mouth at bedtime.    Marland Kitchen. omeprazole (PRILOSEC) 20 MG capsule Take 20 mg by mouth 2 (two) times daily before a meal.    . Pitavastatin Calcium (LIVALO PO) Take by mouth.    Marland Kitchen. amoxicillin (AMOXIL) 500 MG capsule     . cefpodoxime (VANTIN) 100 MG tablet Take 1 tablet (100 mg total) by mouth 2 (two) times daily. (Patient not taking: Reported on 10/26/2014) 14 tablet 0  . diptheria-tetanus toxoids (DECAVAC) 2-2 LF/0.5ML injection     . ondansetron (ZOFRAN) 4 MG tablet Take 1 tablet (4 mg total) by mouth every 6 (six) hours. (Patient not taking: Reported on 10/26/2014) 12 tablet 0  . promethazine (  PHENERGAN) 12.5 MG tablet      No current facility-administered medications for this visit.    Allergies  Allergen Reactions  . Contrast Media [Iodinated Diagnostic Agents] Other (See Comments)    Patient breaks out in hives pt has had ct scans in the past with premeds and has not had reaction.  . Other Nausea And Vomiting    Seafood allergy-rash     REVIEW OF SYSTEMS:  (Positives checked otherwise negative)  CARDIOVASCULAR:    chest pain,   chest pressure,   palpitations,   shortness of breath when laying flat,   shortness of breath with exertion,    pain in feet when walking,   pain in feet when laying flat,  history of blood clot in veins (DVT),   history of phlebitis,    swelling in legs,   varicose veins  PULMONARY:    productive cough,   asthma,   wheezing  NEUROLOGIC:    weakness in arms or legs,   numbness in arms or legs,   difficulty speaking or slurred speech,   temporary loss of vision in one eye,   dizziness  HEMATOLOGIC:    bleeding problems,   problems with blood clotting too easily  MUSCULOSKEL:    joint pain,  joint swelling  GASTROINTEST:    vomiting blood,   blood in stool    abd pain   GENITOURINARY:    burning with urination,   blood in urine  PSYCHIATRIC:    history of major depression  INTEGUMENTARY:    rashes,   ulcers  CONSTITUTIONAL:    fever,   chills   Physical Examination  Filed Vitals:   05/01/15 1319  BP: 119/70  Pulse: 62  Height:  (1.626 m)  Weight: 114 lb 9.6 oz (51.982 kg)  SpO2: 100%   Body mass index is 19.66 kg/(m^2).  General: A&O x 3, WD, thin  Head: [place]/AT  Ear/Nose/Throat: Hearing grossly intact, nares without erythema or drainage, oropharynx without Erythema/Exudate, Mallampati score: 3  Eyes: PERRLA, EOMI  Neck: Supple, no nuchal rigidity, no palpable LAD  Pulmonary: Sym exp, good air movt, CTAB, no rales, rhonchi, & wheezing  Cardiac: RRR, Nl S1, S2, no Murmurs, rubs or gallops  Vascular: Vessel Right Left  Radial Palpable Palpable  Brachial Palpable Palpable  Carotid Palpable, without bruit Palpable, without bruit  Aorta Not palpable N/A  Femoral Palpable Palpable  Popliteal Not palpable Not palpable  PT Palpable Palpable  DP Palpable Palpable   Gastrointestinal: soft, NTND, no G/R, no HSM, no masses, no CVAT B  Musculoskeletal: M/S 5/5 throughout , Extremities without ischemic changes , no LDS, minimal edema in LLE, faint small varicosities in both legs  Neurologic: Pain and light touch intact in extremities , Motor exam as listed above  Psychiatric: Judgment intact, Mood & affect  appropriate for pt's clinical situation  Dermatologic: See M/S exam for extremity exam, no rashes otherwise noted  Lymph : No Cervical, Axillary, or Inguinal lymphadenopathy    Non-Invasive Vascular Imaging  LLE Venous Insufficiency Duplex (Date: 05/01/2015):   no DVT and SVT,   + GSV reflux: throughout, but max 4.7 mm  no SSV reflux,  + deep venous reflux: CFV   Outside Studies/Documentation 10 pages of outside documents were reviewed including: outpatient PCP record and  albs.   Medical Decision Making  Isaiah Sherrill is a 74 y.o. female who presents with: LLE chronic venous insufficiency (C2), varicose veins with complications   Based on the patient's history and examination, I recommend: comppressive therapy.  I discussed with the patient the use of her 20-30 mm thigh high compression stockings and need for 3 month trial in the event she wants to proceed with EVLA.  This patient's sx are so mild, I doubt she will need EVLA especially as the L GSV barely meets the size criteria for main carriers.  Thank you for allowing [place] to participate in this patient's care.   Leonides Sake, MD [facility] Office: [phone] Pager: [phone]  05/01/2015, 1:52 PM

## 2015-07-08 DIAGNOSIS — R0982 Postnasal drip: Secondary | ICD-10-CM | POA: Diagnosis not present

## 2015-07-08 DIAGNOSIS — Z79899 Other long term (current) drug therapy: Secondary | ICD-10-CM | POA: Diagnosis not present

## 2015-07-08 DIAGNOSIS — E7800 Pure hypercholesterolemia, unspecified: Secondary | ICD-10-CM | POA: Diagnosis not present

## 2015-07-08 DIAGNOSIS — J4520 Mild intermittent asthma, uncomplicated: Secondary | ICD-10-CM | POA: Diagnosis not present

## 2015-07-17 ENCOUNTER — Encounter (HOSPITAL_COMMUNITY): Payer: Self-pay | Admitting: *Deleted

## 2015-07-17 ENCOUNTER — Emergency Department (HOSPITAL_COMMUNITY)
Admission: EM | Admit: 2015-07-17 | Discharge: 2015-07-17 | Disposition: A | Discharge status: Home / Self Care | Payer: Medicare Other | Source: Home / Self Care | Attending: Family Medicine | Admitting: Family Medicine

## 2015-07-17 DIAGNOSIS — K219 Gastro-esophageal reflux disease without esophagitis: Secondary | ICD-10-CM | POA: Diagnosis not present

## 2015-07-17 LAB — POCT URINALYSIS DIP (DEVICE)
Bilirubin Urine: NEGATIVE
Glucose, UA: NEGATIVE mg/dL
KETONES UR: NEGATIVE mg/dL
LEUKOCYTES UA: NEGATIVE
Nitrite: NEGATIVE
Protein, ur: NEGATIVE mg/dL
SPECIFIC GRAVITY, URINE: 1.01 (ref 1.005–1.030)
Urobilinogen, UA: 0.2 mg/dL (ref 0.0–1.0)
pH: 6.5 (ref 5.0–8.0)

## 2015-07-17 MED ORDER — GI COCKTAIL ~~LOC~~
30.0000 mL | Freq: Once | ORAL | Status: AC
  Administered 2015-07-17: 30 mL via ORAL

## 2015-07-17 MED ORDER — ESOMEPRAZOLE MAGNESIUM 40 MG PO CPDR: 40.0000 mg | DELAYED_RELEASE_CAPSULE | Freq: Every day | ORAL | Status: DC

## 2015-07-17 MED ORDER — GI COCKTAIL ~~LOC~~
ORAL | Status: AC
  Filled 2015-07-17: qty 30

## 2015-07-17 NOTE — ED Notes (Signed)
Pt   Reports    Low  abd    Pain      With     Pain in  Epigastric  Pain          Pt  Reports     Pain  When  She  Urinates     Pt      Ambulates   With a  Steady  Fluid  Gait    Pt  denys  Any  Vomiting

## 2015-07-17 NOTE — Discharge Instructions (Signed)
Take medicine daily and see your doctor if further problems.

## 2015-07-17 NOTE — ED Provider Notes (Signed)
CSN: [id number]     Arrival date & time 07/17/15  1547 History   First MD Initiated Contact with Patient 07/17/15 1652     Chief Complaint  Patient presents with  . Abdominal Pain   (Consider location/radiation/quality/duration/timing/severity/associated sxs/prior Treatment) Patient is a 75 y.o. female presenting with abdominal pain. The history is provided by the patient.  Abdominal Pain Pain location:  Epigastric and suprapubic Pain quality: burning and pressure   Pain radiates to:  Does not radiate Pain severity:  Mild Onset quality:  Gradual Duration:  1 week Progression:  Unchanged Chronicity:  New Context comment:  Has run out of prilosec. Associated symptoms: dysuria   Associated symptoms: no diarrhea, no fever, no nausea, no vaginal bleeding, no vaginal discharge and no vomiting     Past Medical History  Diagnosis Date  . UTI (lower urinary tract infection)   . Asthma   . GERD (gastroesophageal reflux disease)   . Hypercholesteremia   . H. pylori infection    Past Surgical History  Procedure Laterality Date  . Abdominal surgery    . Abdominal hysterectomy    . Cesarean section     History reviewed. No pertinent family history. Social History  Substance Use Topics  . Smoking status: Never Smoker   . Smokeless tobacco: Never Used  . Alcohol Use: No   OB History    No data available     Review of Systems  Constitutional: Negative.  Negative for fever.  Gastrointestinal: Positive for abdominal pain. Negative for nausea, vomiting and diarrhea.  Genitourinary: Positive for dysuria, urgency and frequency. Negative for vaginal bleeding and vaginal discharge.  Musculoskeletal: Negative.   Skin: Negative.   All other systems reviewed and are negative.   Allergies  Contrast media and Other  Home Medications   Prior to Admission medications   Medication Sig Start Date End Date Taking? Authorizing Provider  albuterol (PROVENTIL HFA;VENTOLIN HFA) 108 (90  BASE) MCG/ACT inhaler Inhale 2 puffs into the lungs every 6 (six) hours as needed for wheezing or shortness of breath. 07/01/13   Rodolph BongEvan S Corey, MD  amoxicillin (AMOXIL) 500 MG capsule  04/20/15   Historical Provider, MD  atorvastatin (LIPITOR) 10 MG tablet Take 10 mg by mouth daily.    Historical Provider, MD  budesonide-formoterol (SYMBICORT) 160-4.5 MCG/ACT inhaler Inhale 2 puffs into the lungs 2 (two) times daily. 12/22/12   Vassie Lollarlos Madera, MD  cefpodoxime (VANTIN) 100 MG tablet Take 1 tablet (100 mg total) by mouth 2 (two) times daily. Patient not taking: Reported on 10/26/2014 10/10/13   Sunnie NielsenBrian Opitz, MD  clarithromycin (BIAXIN) 500 MG tablet Take 500 mg by mouth 2 (two) times daily.    Historical Provider, MD  diptheria-tetanus toxoids [facility](DECAVAC) 2-2 LF/0.5ML injection  03/05/15   Historical Provider, MD  esomeprazole (NEXIUM) 40 MG capsule Take 1 capsule (40 mg total) by mouth daily. 07/17/15   Linna HoffJames D Charnae Lill, MD  imipramine (TOFRANIL) 50 MG tablet Take 50 mg by mouth at bedtime.    Historical Provider, MD  mirtazapine (REMERON) 15 MG tablet Take 15 mg by mouth at bedtime.    Historical Provider, MD  omeprazole (PRILOSEC) 20 MG capsule Take 20 mg by mouth 2 (two) times daily before a meal.    Historical Provider, MD  ondansetron (ZOFRAN) 4 MG tablet Take 1 tablet (4 mg total) by mouth every 6 (six) hours. Patient not taking: Reported on 10/26/2014 10/10/13   Sunnie NielsenBrian Opitz, MD  Pitavastatin Calcium (LIVALO PO) Take by  mouth.    Historical Provider, MD  promethazine (PHENERGAN) 12.5 MG tablet  03/12/15   Historical Provider, MD   Meds Ordered and Administered this Visit  Medications - No data to display  BP 129/77 mmHg  Pulse 61  Temp(Src) 97.6 F (36.4 C) (Oral)  Resp 12  SpO2 100% No data found.   Physical Exam  Constitutional: She is oriented to person, place, and time. She appears well-developed and well-nourished. No distress.  Abdominal: Soft. Bowel sounds are normal. She exhibits no  distension and no mass. There is tenderness in the epigastric area. There is no rebound, no guarding and no CVA tenderness.  Neurological: She is alert and oriented to person, place, and time.  Skin: Skin is warm and dry.  Nursing note and vitals reviewed.   ED Course  Procedures (including critical care time)  Labs Review Labs Reviewed  POCT URINALYSIS DIP (DEVICE) - Abnormal; Notable for the following:    Hgb urine dipstick TRACE (*)    All other components within normal limits   U/a neg.   Imaging Review No results found.   Visual Acuity Review  Right Eye Distance:   Left Eye Distance:   Bilateral Distance:    Right Eye Near:   Left Eye Near:    Bilateral Near:         MDM   1. GERD without esophagitis       Linna Hoff, MD 07/17/15 1737

## 2015-07-29 DIAGNOSIS — N760 Acute vaginitis: Secondary | ICD-10-CM | POA: Diagnosis not present

## 2015-07-29 DIAGNOSIS — R350 Frequency of micturition: Secondary | ICD-10-CM | POA: Diagnosis not present

## 2015-08-14 DIAGNOSIS — J029 Acute pharyngitis, unspecified: Secondary | ICD-10-CM | POA: Diagnosis not present

## 2015-08-14 DIAGNOSIS — J22 Unspecified acute lower respiratory infection: Secondary | ICD-10-CM | POA: Diagnosis not present

## 2015-10-26 DIAGNOSIS — J4520 Mild intermittent asthma, uncomplicated: Secondary | ICD-10-CM | POA: Diagnosis not present

## 2015-10-26 DIAGNOSIS — I70213 Atherosclerosis of native arteries of extremities with intermittent claudication, bilateral legs: Secondary | ICD-10-CM | POA: Diagnosis not present

## 2015-10-26 DIAGNOSIS — Z79899 Other long term (current) drug therapy: Secondary | ICD-10-CM | POA: Diagnosis not present

## 2015-10-26 DIAGNOSIS — M25561 Pain in right knee: Secondary | ICD-10-CM | POA: Diagnosis not present

## 2015-11-24 DIAGNOSIS — J029 Acute pharyngitis, unspecified: Secondary | ICD-10-CM | POA: Diagnosis not present

## 2015-11-24 DIAGNOSIS — J069 Acute upper respiratory infection, unspecified: Secondary | ICD-10-CM | POA: Diagnosis not present

## 2015-11-24 DIAGNOSIS — R0689 Other abnormalities of breathing: Secondary | ICD-10-CM | POA: Diagnosis not present

## 2015-12-08 DIAGNOSIS — I70213 Atherosclerosis of native arteries of extremities with intermittent claudication, bilateral legs: Secondary | ICD-10-CM | POA: Diagnosis not present

## 2015-12-30 DIAGNOSIS — M25561 Pain in right knee: Secondary | ICD-10-CM | POA: Diagnosis not present

## 2015-12-30 DIAGNOSIS — Z712 Person consulting for explanation of examination or test findings: Secondary | ICD-10-CM | POA: Diagnosis not present

## 2015-12-30 DIAGNOSIS — S50861S Insect bite (nonvenomous) of right forearm, sequela: Secondary | ICD-10-CM | POA: Diagnosis not present

## 2015-12-30 DIAGNOSIS — M79604 Pain in right leg: Secondary | ICD-10-CM | POA: Diagnosis not present

## 2015-12-30 DIAGNOSIS — S0006XS Insect bite (nonvenomous) of scalp, sequela: Secondary | ICD-10-CM | POA: Diagnosis not present

## 2016-02-22 DIAGNOSIS — Z79899 Other long term (current) drug therapy: Secondary | ICD-10-CM | POA: Diagnosis not present

## 2016-02-22 DIAGNOSIS — I70213 Atherosclerosis of native arteries of extremities with intermittent claudication, bilateral legs: Secondary | ICD-10-CM | POA: Diagnosis not present

## 2016-02-22 DIAGNOSIS — R350 Frequency of micturition: Secondary | ICD-10-CM | POA: Diagnosis not present

## 2016-02-22 DIAGNOSIS — E559 Vitamin D deficiency, unspecified: Secondary | ICD-10-CM | POA: Diagnosis not present

## 2016-02-22 DIAGNOSIS — Z0000 Encounter for general adult medical examination without abnormal findings: Secondary | ICD-10-CM | POA: Diagnosis not present

## 2016-02-22 DIAGNOSIS — E785 Hyperlipidemia, unspecified: Secondary | ICD-10-CM | POA: Diagnosis not present

## 2016-03-28 DIAGNOSIS — M79605 Pain in left leg: Secondary | ICD-10-CM | POA: Diagnosis not present

## 2016-03-28 DIAGNOSIS — M79604 Pain in right leg: Secondary | ICD-10-CM | POA: Diagnosis not present

## 2016-03-28 DIAGNOSIS — Z23 Encounter for immunization: Secondary | ICD-10-CM | POA: Diagnosis not present

## 2016-03-28 DIAGNOSIS — M79606 Pain in leg, unspecified: Secondary | ICD-10-CM | POA: Diagnosis not present

## 2016-05-18 DIAGNOSIS — Z6823 Body mass index (BMI) 23.0-23.9, adult: Secondary | ICD-10-CM | POA: Diagnosis not present

## 2016-05-18 DIAGNOSIS — J069 Acute upper respiratory infection, unspecified: Secondary | ICD-10-CM | POA: Diagnosis not present

## 2016-05-18 DIAGNOSIS — Z1389 Encounter for screening for other disorder: Secondary | ICD-10-CM | POA: Diagnosis not present

## 2016-05-19 ENCOUNTER — Other Ambulatory Visit: Payer: Self-pay | Admitting: Internal Medicine

## 2016-05-19 ENCOUNTER — Other Ambulatory Visit: Payer: Self-pay | Admitting: Obstetrics and Gynecology

## 2016-05-19 DIAGNOSIS — Z1231 Encounter for screening mammogram for malignant neoplasm of breast: Secondary | ICD-10-CM

## 2016-05-31 ENCOUNTER — Ambulatory Visit: Payer: Medicare Other

## 2016-06-01 DIAGNOSIS — M810 Age-related osteoporosis without current pathological fracture: Secondary | ICD-10-CM | POA: Diagnosis not present

## 2016-06-14 ENCOUNTER — Ambulatory Visit
Admission: RE | Admit: 2016-06-14 | Discharge: 2016-06-14 | Disposition: A | Discharge status: Home / Self Care | Payer: Medicare Other | Source: Ambulatory Visit | Attending: Internal Medicine | Admitting: Internal Medicine

## 2016-06-14 DIAGNOSIS — Z1231 Encounter for screening mammogram for malignant neoplasm of breast: Secondary | ICD-10-CM

## 2016-07-13 DIAGNOSIS — J029 Acute pharyngitis, unspecified: Secondary | ICD-10-CM | POA: Diagnosis not present

## 2016-07-28 ENCOUNTER — Ambulatory Visit (HOSPITAL_COMMUNITY)
Admission: EM | Admit: 2016-07-28 | Discharge: 2016-07-28 | Disposition: A | Discharge status: Home / Self Care | Payer: Medicare Other | Attending: Family Medicine | Admitting: Family Medicine

## 2016-07-28 ENCOUNTER — Encounter (HOSPITAL_COMMUNITY): Payer: Self-pay | Admitting: Family Medicine

## 2016-07-28 DIAGNOSIS — N390 Urinary tract infection, site not specified: Secondary | ICD-10-CM | POA: Insufficient documentation

## 2016-07-28 DIAGNOSIS — Z79899 Other long term (current) drug therapy: Secondary | ICD-10-CM | POA: Diagnosis not present

## 2016-07-28 DIAGNOSIS — R319 Hematuria, unspecified: Secondary | ICD-10-CM | POA: Diagnosis not present

## 2016-07-28 DIAGNOSIS — R110 Nausea: Secondary | ICD-10-CM | POA: Diagnosis not present

## 2016-07-28 DIAGNOSIS — M549 Dorsalgia, unspecified: Secondary | ICD-10-CM | POA: Diagnosis not present

## 2016-07-28 LAB — POCT URINALYSIS DIP (DEVICE)
Bilirubin Urine: NEGATIVE
Glucose, UA: NEGATIVE mg/dL
Ketones, ur: NEGATIVE mg/dL
Nitrite: NEGATIVE
PH: 7 (ref 5.0–8.0)
PROTEIN: NEGATIVE mg/dL
SPECIFIC GRAVITY, URINE: 1.01 (ref 1.005–1.030)
UROBILINOGEN UA: 0.2 mg/dL (ref 0.0–1.0)

## 2016-07-28 MED ORDER — SULFAMETHOXAZOLE-TRIMETHOPRIM 800-160 MG PO TABS: 1.0000 | ORAL_TABLET | Freq: Two times a day (BID) | ORAL | 0 refills | Status: DC

## 2016-07-28 NOTE — ED Notes (Signed)
Pt seen on 07/28/16 and left meds in the room... Called her home number and she said she will be here tomorrow (07/29/16) to p/u meds.... Left at the front under her last name.

## 2016-07-28 NOTE — ED Triage Notes (Signed)
Pain and blood noticed in urine, onset tuesday

## 2016-07-28 NOTE — ED Provider Notes (Signed)
[facility]    CSN: [id number] Arrival date & time: 07/28/16  1728     History   Chief Complaint Chief Complaint  Patient presents with  . Urinary Tract Infection    HPI Samantha [name] is a 76 y.o. female.   This a 76 year old woman who presents to the [facility] urgent care center with complaints consistent with urinary tract infection.  She developed frequency 2 days ago and has had several episodes of hematuria today. She also had some nausea and notes some central low midline back pain.  She's had a urinary tract infection before and that's why she thinks this is a recurrence.      Past Medical History:  Diagnosis Date  . Asthma   . GERD (gastroesophageal reflux disease)   . H. pylori infection   . Hypercholesteremia   . UTI (lower urinary tract infection)     Patient Active Problem List   Diagnosis Date Noted  . Chronic venous insufficiency 05/01/2015  . Varicose veins of lower extremities with complications 05/01/2015  . Colitis 12/19/2012  . Dyslipidemia 12/19/2012  . ASTHMA 02/15/2008  . SHORTNESS OF BREATH (SOB) 02/15/2008  . CHEST PAIN-UNSPECIFIED 02/15/2008    Past Surgical History:  Procedure Laterality Date  . ABDOMINAL HYSTERECTOMY    . ABDOMINAL SURGERY    . CESAREAN SECTION      OB History    No data available       Home Medications    Prior to Admission medications   Medication Sig Start Date End Date Taking? Authorizing Provider  albuterol (PROVENTIL HFA;VENTOLIN HFA) 108 (90 BASE) MCG/ACT inhaler Inhale 2 puffs into the lungs every 6 (six) hours as needed for wheezing or shortness of breath. 07/01/13   Rodolph Bong, MD  amoxicillin (AMOXIL) 500 MG capsule  04/20/15   Historical Provider, MD  atorvastatin (LIPITOR) 10 MG tablet Take 10 mg by mouth daily.    Historical Provider, MD  budesonide-formoterol (SYMBICORT) 160-4.5 MCG/ACT inhaler Inhale 2 puffs into the lungs 2 (two) times daily. 12/22/12   Vassie Loll, MD  clarithromycin (BIAXIN) 500 MG tablet Take 500 mg by mouth 2 (two) times daily.    Historical Provider, MD  diptheria-tetanus toxoids [facility]) 2-2 LF/0.5ML injection  03/05/15   Historical Provider, MD  esomeprazole (NEXIUM) 40 MG capsule Take 1 capsule (40 mg total) by mouth daily. 07/17/15   Linna Hoff, MD  imipramine (TOFRANIL) 50 MG tablet Take 50 mg by mouth at bedtime.    Historical Provider, MD  mirtazapine (REMERON) 15 MG tablet Take 15 mg by mouth at bedtime.    Historical Provider, MD  omeprazole (PRILOSEC) 20 MG capsule Take 20 mg by mouth 2 (two) times daily before a meal.    Historical Provider, MD  Pitavastatin Calcium (LIVALO PO) Take by mouth.    Historical Provider, MD  promethazine (PHENERGAN) 12.5 MG tablet  03/12/15   Historical Provider, MD  sulfamethoxazole-trimethoprim (BACTRIM DS,SEPTRA DS) 800-160 MG tablet Take 1 tablet by mouth 2 (two) times daily. 07/28/16   Elvina Sidle, MD    Family History No family history on file.  Social History Social History  Substance Use Topics  . Smoking status: Never Smoker  . Smokeless tobacco: Never Used  . Alcohol use No     Allergies   Contrast media [iodinated diagnostic agents] and Other   Review of Systems Review of Systems  Constitutional: Positive for fatigue.  HENT: Positive for sore throat.  Respiratory: Negative.   Cardiovascular: Negative.   Gastrointestinal: Positive for vomiting.  Genitourinary: Positive for frequency, hematuria and urgency.  Musculoskeletal: Positive for back pain.  Neurological: Negative.   Hematological: Negative.   Psychiatric/Behavioral: Negative.      Physical Exam Triage Vital Signs ED Triage Vitals  Enc Vitals Group     BP      Pulse      Resp      Temp      Temp src      SpO2      Weight      Height      Head Circumference      Peak Flow      Pain Score      Pain Loc      Pain Edu?      Excl. in GC?    No data found.   Updated Vital Signs BP  130/76 (BP Location: Left Arm)   Pulse 68   Temp 98 F (36.7 C) (Oral)   Resp 18   SpO2 99%    Physical Exam  Constitutional: She is oriented to person, place, and time. She appears well-developed and well-nourished.  HENT:  Head: Normocephalic.  Right Ear: External ear normal.  Left Ear: External ear normal.  Mouth/Throat: Oropharynx is clear and moist.  Eyes: Conjunctivae and EOM are normal. Pupils are equal, round, and reactive to light.  Neck: Normal range of motion. Neck supple.  Cardiovascular: Normal rate.   Pulmonary/Chest: Effort normal.  Abdominal: Soft. There is tenderness.  Some mild tenderness in the suprapubic region with deep palpation  Musculoskeletal: Normal range of motion.  Neurological: She is alert and oriented to person, place, and time.  Skin: Skin is warm and dry.  Nursing note and vitals reviewed.    UC Treatments / Results  Labs (all labs ordered are listed, but only abnormal results are displayed) Labs Reviewed  POCT URINALYSIS DIP (DEVICE) - Abnormal; Notable for the following:       Result Value   Hgb urine dipstick MODERATE (*)    Leukocytes, UA LARGE (*)    All other components within normal limits  URINE CULTURE    EKG  EKG Interpretation None       Radiology No results found.  Procedures Procedures (including critical care time)  Medications Ordered in UC Medications - No data to display   Initial Impression / Assessment and Plan / UC Course  I have reviewed the triage vital signs and the nursing notes.  Pertinent labs & imaging results that were available during my care of the patient were reviewed by me and considered in my medical decision making (see chart for details).     Final Clinical Impressions(s) / UC Diagnoses   Final diagnoses:  Lower urinary tract infectious disease    New Prescriptions New Prescriptions   SULFAMETHOXAZOLE-TRIMETHOPRIM (BACTRIM DS,SEPTRA DS) 800-160 MG TABLET    Take 1 tablet by  mouth 2 (two) times daily.     Elvina SidleKurt Yuri Flener, MD 07/28/16 [phone]

## 2016-07-29 LAB — URINE CULTURE: Culture: <10000 — AB

## 2016-09-05 DIAGNOSIS — R0600 Dyspnea, unspecified: Secondary | ICD-10-CM | POA: Diagnosis not present

## 2016-09-05 DIAGNOSIS — R5383 Other fatigue: Secondary | ICD-10-CM | POA: Diagnosis not present

## 2016-09-05 DIAGNOSIS — J45909 Unspecified asthma, uncomplicated: Secondary | ICD-10-CM | POA: Diagnosis not present

## 2016-09-05 DIAGNOSIS — R079 Chest pain, unspecified: Secondary | ICD-10-CM | POA: Diagnosis not present

## 2016-10-31 ENCOUNTER — Ambulatory Visit (HOSPITAL_COMMUNITY)
Admission: EM | Admit: 2016-10-31 | Discharge: 2016-10-31 | Disposition: A | Discharge status: Home / Self Care | Payer: Medicare Other | Attending: Internal Medicine | Admitting: Internal Medicine

## 2016-10-31 ENCOUNTER — Encounter (HOSPITAL_COMMUNITY): Payer: Self-pay | Admitting: Emergency Medicine

## 2016-10-31 DIAGNOSIS — J029 Acute pharyngitis, unspecified: Secondary | ICD-10-CM

## 2016-10-31 DIAGNOSIS — R0982 Postnasal drip: Secondary | ICD-10-CM | POA: Diagnosis not present

## 2016-10-31 DIAGNOSIS — H1033 Unspecified acute conjunctivitis, bilateral: Secondary | ICD-10-CM | POA: Diagnosis not present

## 2016-10-31 MED ORDER — TRIAMCINOLONE ACETONIDE 40 MG/ML IJ SUSP
30.0000 mg | Freq: Once | INTRAMUSCULAR | Status: AC
  Administered 2016-10-31: 30 mg via INTRAMUSCULAR

## 2016-10-31 MED ORDER — TRIAMCINOLONE ACETONIDE 40 MG/ML IJ SUSP
INTRAMUSCULAR | Status: AC
  Filled 2016-10-31: qty 1

## 2016-10-31 NOTE — ED Triage Notes (Signed)
The patient presented to the [facility] with a complaint of bilateral eye soreness x 2 weeks and a sore throat that started last night.

## 2016-10-31 NOTE — ED Provider Notes (Signed)
CSN: [id number]     Arrival date & time 10/31/16  1517 History   First MD Initiated Contact with Patient 10/31/16 1720     Chief Complaint  Patient presents with  . Sore Throat  . Eye Pain   (Consider location/radiation/quality/duration/timing/severity/associated sxs/prior Treatment) Pleasant 76 year old female complaining of burning itching and watery eyes for 4-5 days. She is also complaining of a sore throat this started about the same time. History of allergies.      Past Medical History:  Diagnosis Date  . Asthma   . GERD (gastroesophageal reflux disease)   . H. pylori infection   . Hypercholesteremia   . UTI (lower urinary tract infection)    Past Surgical History:  Procedure Laterality Date  . ABDOMINAL HYSTERECTOMY    . ABDOMINAL SURGERY    . CESAREAN SECTION     History reviewed. No pertinent family history. Social History  Substance Use Topics  . Smoking status: Never Smoker  . Smokeless tobacco: Never Used  . Alcohol use No   OB History    No data available     Review of Systems  Constitutional: Negative for activity change, appetite change, chills, fatigue and fever.  HENT: Positive for postnasal drip, rhinorrhea and sore throat. Negative for congestion and facial swelling.   Eyes: Positive for discharge, redness and itching. Negative for photophobia and visual disturbance.  Respiratory: Negative.   Cardiovascular: Negative.   Musculoskeletal: Negative for neck pain and neck stiffness.  Skin: Negative for pallor and rash.  Neurological: Negative.   All other systems reviewed and are negative.   Allergies  Contrast media [iodinated diagnostic agents] and Other  Home Medications   Prior to Admission medications   Medication Sig Start Date End Date Taking? Authorizing Provider  albuterol (PROVENTIL HFA;VENTOLIN HFA) 108 (90 BASE) MCG/ACT inhaler Inhale 2 puffs into the lungs every 6 (six) hours as needed for wheezing or shortness of breath. 07/01/13   Yes Rodolph Bong, MD  budesonide-formoterol [facility]) 160-4.5 MCG/ACT inhaler Inhale 2 puffs into the lungs 2 (two) times daily. 12/22/12  Yes Vassie Loll, MD  pravastatin (PRAVACHOL) 20 MG tablet Take 20 mg by mouth daily.   Yes Historical Provider, MD   Meds Ordered and Administered this Visit   Medications  triamcinolone acetonide (KENALOG-40) injection 30 mg (not administered)    BP 102/82 (BP Location: Right Arm)   Pulse 66   Temp 97.6 F (36.4 C) (Oral)   Resp 16   SpO2 100%  No data found.   Physical Exam  Constitutional: She appears well-developed and well-nourished. No distress.  HENT:  Head: Normocephalic and atraumatic.  Bilateral TMs are normal. Oropharynx with streaky erythema and clear PND. No exudates.  Eyes: EOM are normal. Pupils are equal, round, and reactive to light.  Bilateral conjunctivae with erythema and mild swelling. Clear mucoid drainage.  Neck: Normal range of motion. Neck supple.  Cardiovascular: Normal rate and regular rhythm.   Pulmonary/Chest: Effort normal and breath sounds normal.  Musculoskeletal: Normal range of motion.  Lymphadenopathy:    She has no cervical adenopathy.  Neurological: She is alert.  Skin: Skin is warm.  Nursing note and vitals reviewed.   Urgent Care Course     Procedures (including critical care time)  Labs Review Labs Reviewed - No data to display  Imaging Review No results found.   Visual Acuity Review  Right Eye Distance:   Left Eye Distance:   Bilateral Distance:    Right Eye Near:  Left Eye Near:    Bilateral Near:         MDM   1. Acute conjunctivitis of both eyes, unspecified acute conjunctivitis type   2. PND (post-nasal drip)   3. Sore throat    You have environmental allergies that are causing your eyes to be red, burning and draining. You will also have drainage in the back of your throat due to allergies. Recommended you take Zyrtec or Allegra daily for drainage in the throat,  Cepacol lozenges for sore throat pain, ibuprofen 400 mg every 6-8 hours for sore throat pain, Zaditor eyedrops 1 drop in each eye twice a day.    Hayden Rasmussen, NP 10/31/16 1734

## 2016-10-31 NOTE — Discharge Instructions (Signed)
You have environmental allergies that are causing your eyes to be red, burning and draining. You will also have drainage in the back of your throat due to allergies. Recommended you take Zyrtec or Allegra daily for drainage in the throat, Cepacol lozenges for sore throat pain, ibuprofen 400 mg every 6-8 hours for sore throat pain, Zaditor eyedrops 1 drop in each eye twice a day.

## 2016-11-07 DIAGNOSIS — J45909 Unspecified asthma, uncomplicated: Secondary | ICD-10-CM | POA: Diagnosis not present

## 2016-11-07 DIAGNOSIS — R7309 Other abnormal glucose: Secondary | ICD-10-CM | POA: Diagnosis not present

## 2016-11-07 DIAGNOSIS — E785 Hyperlipidemia, unspecified: Secondary | ICD-10-CM | POA: Diagnosis not present

## 2016-11-07 DIAGNOSIS — R319 Hematuria, unspecified: Secondary | ICD-10-CM | POA: Diagnosis not present

## 2016-11-07 DIAGNOSIS — J309 Allergic rhinitis, unspecified: Secondary | ICD-10-CM | POA: Diagnosis not present

## 2016-11-07 DIAGNOSIS — Z79899 Other long term (current) drug therapy: Secondary | ICD-10-CM | POA: Diagnosis not present

## 2017-02-09 DIAGNOSIS — Z6824 Body mass index (BMI) 24.0-24.9, adult: Secondary | ICD-10-CM | POA: Diagnosis not present

## 2017-02-09 DIAGNOSIS — L304 Erythema intertrigo: Secondary | ICD-10-CM | POA: Diagnosis not present

## 2017-02-09 DIAGNOSIS — J45909 Unspecified asthma, uncomplicated: Secondary | ICD-10-CM | POA: Diagnosis not present

## 2017-02-09 DIAGNOSIS — R0982 Postnasal drip: Secondary | ICD-10-CM | POA: Diagnosis not present

## 2017-03-29 DIAGNOSIS — Z23 Encounter for immunization: Secondary | ICD-10-CM | POA: Diagnosis not present

## 2017-03-29 DIAGNOSIS — Z79899 Other long term (current) drug therapy: Secondary | ICD-10-CM | POA: Diagnosis not present

## 2017-03-29 DIAGNOSIS — J45909 Unspecified asthma, uncomplicated: Secondary | ICD-10-CM | POA: Diagnosis not present

## 2017-03-29 DIAGNOSIS — E785 Hyperlipidemia, unspecified: Secondary | ICD-10-CM | POA: Diagnosis not present

## 2017-03-29 DIAGNOSIS — Z0000 Encounter for general adult medical examination without abnormal findings: Secondary | ICD-10-CM | POA: Diagnosis not present

## 2017-03-29 DIAGNOSIS — E559 Vitamin D deficiency, unspecified: Secondary | ICD-10-CM | POA: Diagnosis not present

## 2017-04-11 ENCOUNTER — Ambulatory Visit (INDEPENDENT_AMBULATORY_CARE_PROVIDER_SITE_OTHER): Payer: Medicare Other | Admitting: Podiatry

## 2017-04-11 ENCOUNTER — Encounter: Payer: Self-pay | Admitting: Podiatry

## 2017-04-11 VITALS — BP 149/70 | HR 54 | Ht 61.0 in | Wt 114.0 lb

## 2017-04-11 DIAGNOSIS — M21969 Unspecified acquired deformity of unspecified lower leg: Secondary | ICD-10-CM | POA: Diagnosis not present

## 2017-04-11 DIAGNOSIS — M722 Plantar fascial fibromatosis: Secondary | ICD-10-CM

## 2017-04-11 DIAGNOSIS — R262 Difficulty in walking, not elsewhere classified: Secondary | ICD-10-CM | POA: Diagnosis not present

## 2017-04-11 NOTE — Progress Notes (Signed)
SUBJECTIVE: 76 y.o. year old female presents accompanied by an interpretor complaining of pain in right heel. Pain is affecting her right hip as she walk. Duration of the pain is since September 2018. On feet all day at home.Pain is unpredictable day or night.  Patient also relates history of injury to right foot many years ago. In 1972 she stepped on a nail with right heel in center. Was treated minimally while she was living in [place].  REVIEW OF SYSTEMS: Review of Systems  Constitutional: Negative for chills, fever, malaise/fatigue and weight loss.  HENT: Negative for ear discharge, ear pain and nosebleeds.   Eyes: Negative for blurred vision, double vision, pain and discharge.  Respiratory: Positive for shortness of breath. Negative for cough and wheezing.   Cardiovascular: Negative for chest pain, palpitations and leg swelling.  Gastrointestinal: Positive for heartburn. Negative for abdominal pain and diarrhea.  Genitourinary: Negative for dysuria, frequency and urgency.  Musculoskeletal: Positive for joint pain. Negative for back pain, myalgias and neck pain.  Skin: Negative for itching and rash.  Neurological: Negative for dizziness, tingling, sensory change and speech change.  Psychiatric/Behavioral: Negative for depression.  Being treated for Asthma and high Cholesterol.  OBJECTIVE: DERMATOLOGIC EXAMINATION: No abnormal findings.  VASCULAR EXAMINATION OF LOWER LIMBS: All pedal pulses are palpable with normal pulsation.  Capillary Filling times within 3 seconds in all digits.  No edema or erythema noted. Temperature gradient from tibial crest to dorsum of foot is within normal bilateral.  NEUROLOGIC EXAMINATION OF THE LOWER LIMBS: Achilles DTR is present and within normal. Monofilament (Semmes-Weinstein 10-gm) sensory testing positive 6 out of 6, bilateral. Vibratory sensations(128Hz  turning fork) intact at medial and lateral forefoot bilateral.  Sharp and Dull  discriminatory sensations at the plantar ball of hallux is intact bilateral.   MUSCULOSKELETAL EXAMINATION: Positive for excess sagittal plane motion of the first ray bilateral with enlarged bunion L>R.  RADIOGRAPHIC STUDIES:  General overview: Negative of Osteopenia Absence of soft tissue swelling. Absence of acute osseous or articular surfaces of forefoot or rearfoot.  AP View:  Metatarsus adductus bilateral. First metatarsal length -3 bilateral. Enlarged medial eminence of the first metatarsal head left, Fibular sesamoid position at 4 bilateral. Varus rotated lesser digits, 4th and 5th bilateral. Lateral view:  High arched cavus type foot with minimum change in plantar and posterior calcaneal tuberosity bilateral. Positive of dorsal lipping of TNJ right foot.    ASSESSMENT: Plantar fasciitis right. Hypermobile first ray bilateral. Bunion deformity left. Difficulty walking due to pain shooting to right hip joint.  PLAN: Reviewed findings and available treatment options, NSAIA, proper shoe gear, orthotics, injection. As per request right heel injected through lateral aspect with mixture of 4 mg Dexamethasone, 4 mg Triamcinolone, and 1 cc of 0.5% Marcaine plain. Patient tolerated well without difficulty.  Return in 2 weeks if pain continues.

## 2017-04-11 NOTE — Patient Instructions (Addendum)
Seen for pain in right heel. Noted of weak first metatarsal bone on right. Reviewed available treatment options, proper shoe gear, Injection, orthotic, and Advil as needed. As per request injection given to right heel. Return for custom orthotic prep.

## 2017-04-26 ENCOUNTER — Ambulatory Visit (INDEPENDENT_AMBULATORY_CARE_PROVIDER_SITE_OTHER): Payer: Medicare Other | Admitting: Podiatry

## 2017-04-26 ENCOUNTER — Encounter: Payer: Self-pay | Admitting: Podiatry

## 2017-04-26 DIAGNOSIS — M21969 Unspecified acquired deformity of unspecified lower leg: Secondary | ICD-10-CM

## 2017-04-26 DIAGNOSIS — M722 Plantar fascial fibromatosis: Secondary | ICD-10-CM

## 2017-04-26 NOTE — Patient Instructions (Signed)
Follow up on right heel pain. Making some improvement with injection. OTC Shoe liner from Dr. Crecencio Mcomfort dispensed. Return for custom orthotics or for another injection if pain continues.

## 2017-04-26 NOTE — Progress Notes (Signed)
Subjective: 76 y.o. year old female patient presents stating that last injection on right heel helped. Pain is only present when applied lateral squeeze. Patient wants to postpone orthotics and will return for another injection when pain becomes unbearable.  Heel pain since September 2018, some improvement made from the last injection. Positive history of right foot injury in 1972 by stepping on a nail at the heel area. Objective: Dermatologic: Normal skin. Vascular: Pedal pulses are all palpable. Orthopedic: Positive for excess sagittal plane motion of the first ray bilateral. Bunion deformity bilateral. Neurologic: All epicritic and tactile sensations grossly intact.  Assessment: Plantar fasciitis right, some improvement with cortisone injection given 2 weeks ago. Hypermobile first ray bilateral.  Treatment: As per request OTC Shoe liner dispensed (Dr. Comfort's). Patient will return for custom orthotics or injection if pain continues.

## 2017-05-01 DIAGNOSIS — K219 Gastro-esophageal reflux disease without esophagitis: Secondary | ICD-10-CM | POA: Diagnosis not present

## 2017-05-01 DIAGNOSIS — R05 Cough: Secondary | ICD-10-CM | POA: Diagnosis not present

## 2017-05-01 DIAGNOSIS — R110 Nausea: Secondary | ICD-10-CM | POA: Diagnosis not present

## 2017-05-24 ENCOUNTER — Other Ambulatory Visit: Payer: Self-pay

## 2017-05-24 ENCOUNTER — Encounter (HOSPITAL_COMMUNITY): Payer: Self-pay | Admitting: Emergency Medicine

## 2017-05-24 ENCOUNTER — Emergency Department (HOSPITAL_COMMUNITY)
Admission: EM | Admit: 2017-05-24 | Discharge: 2017-05-24 | Disposition: A | Discharge status: Home / Self Care | Payer: Medicare Other | Attending: Emergency Medicine | Admitting: Emergency Medicine

## 2017-05-24 ENCOUNTER — Emergency Department (HOSPITAL_COMMUNITY): Payer: Medicare Other

## 2017-05-24 DIAGNOSIS — W260XXA Contact with knife, initial encounter: Secondary | ICD-10-CM | POA: Diagnosis not present

## 2017-05-24 DIAGNOSIS — Z23 Encounter for immunization: Secondary | ICD-10-CM | POA: Insufficient documentation

## 2017-05-24 DIAGNOSIS — Y929 Unspecified place or not applicable: Secondary | ICD-10-CM | POA: Insufficient documentation

## 2017-05-24 DIAGNOSIS — S61211A Laceration without foreign body of left index finger without damage to nail, initial encounter: Secondary | ICD-10-CM

## 2017-05-24 DIAGNOSIS — Z79899 Other long term (current) drug therapy: Secondary | ICD-10-CM | POA: Insufficient documentation

## 2017-05-24 DIAGNOSIS — J45909 Unspecified asthma, uncomplicated: Secondary | ICD-10-CM | POA: Insufficient documentation

## 2017-05-24 DIAGNOSIS — Y93G1 Activity, food preparation and clean up: Secondary | ICD-10-CM | POA: Diagnosis not present

## 2017-05-24 DIAGNOSIS — Y999 Unspecified external cause status: Secondary | ICD-10-CM | POA: Insufficient documentation

## 2017-05-24 MED ORDER — LIDOCAINE HCL (PF) 1 % IJ SOLN
10.0000 mL | Freq: Once | INTRAMUSCULAR | Status: AC
  Administered 2017-05-24: 10 mL
  Filled 2017-05-24: qty 10

## 2017-05-24 MED ORDER — BACITRACIN ZINC 500 UNIT/GM EX OINT: 1.0000 "application " | TOPICAL_OINTMENT | Freq: Two times a day (BID) | CUTANEOUS | 0 refills | Status: DC

## 2017-05-24 MED ORDER — CEPHALEXIN 500 MG PO CAPS: 500.0000 mg | ORAL_CAPSULE | Freq: Four times a day (QID) | ORAL | 0 refills | Status: DC

## 2017-05-24 MED ORDER — HYDROCODONE-ACETAMINOPHEN 5-325 MG PO TABS: 1.0000 | ORAL_TABLET | Freq: Four times a day (QID) | ORAL | 0 refills | Status: DC | PRN

## 2017-05-24 MED ORDER — TETANUS-DIPHTH-ACELL PERTUSSIS 5-2.5-18.5 LF-MCG/0.5 IM SUSP
0.5000 mL | Freq: Once | INTRAMUSCULAR | Status: AC
  Administered 2017-05-24: 0.5 mL via INTRAMUSCULAR
  Filled 2017-05-24: qty 0.5

## 2017-05-24 NOTE — ED Notes (Addendum)
Pt has laceration present to left index finger. Laceration is roughly "U" shaped and wraps around the finger. The finger is oozing blood. Bleeding is under control with gauze applied. PMS is intact distal to injury.

## 2017-05-24 NOTE — ED Provider Notes (Signed)
[facility]   CSN: [id number] Arrival date & time: 05/24/17  1831     History   Chief Complaint Chief Complaint  Patient presents with  . Finger Injury    HPI Samantha [name] is a 76 y.o. female with history of GERD, hypercholesterolemia finger laceration that she sustained just prior to arrival while cutting chicken.  She got bleeding controlled prior to arrival with pressure and presents with finger and a Band-Aid.  Her tetanus is not up-to-date.  She denies any change in range of motion.  She reports she has always had a decrease in range of motion since she was a child and had an injury.  She denies any other injuries. She denies numbness.  HPI  Past Medical History:  Diagnosis Date  . Asthma   . GERD (gastroesophageal reflux disease)   . H. pylori infection   . Hypercholesteremia   . UTI (lower urinary tract infection)     Patient Active Problem List   Diagnosis Date Noted  . Chronic venous insufficiency 05/01/2015  . Varicose veins of lower extremities with complications 05/01/2015  . Colitis 12/19/2012  . Dyslipidemia 12/19/2012  . ASTHMA 02/15/2008  . SHORTNESS OF BREATH (SOB) 02/15/2008  . CHEST PAIN-UNSPECIFIED 02/15/2008    Past Surgical History:  Procedure Laterality Date  . ABDOMINAL HYSTERECTOMY    . ABDOMINAL SURGERY    . CESAREAN SECTION      OB History    No data available       Home Medications    Prior to Admission medications   Medication Sig Start Date End Date Taking? Authorizing Provider  albuterol (PROVENTIL HFA;VENTOLIN HFA) 108 (90 BASE) MCG/ACT inhaler Inhale 2 puffs into the lungs every 6 (six) hours as needed for wheezing or shortness of breath. 07/01/13   Rodolph Bongorey, Evan S, MD  bacitracin ointment Apply 1 application topically 2 (two) times daily. 05/24/17   Alpha Mysliwiec, Waylan BogaAlexandra M, PA-C  budesonide-formoterol (SYMBICORT) 160-4.5 MCG/ACT inhaler Inhale 2 puffs into the lungs 2 (two) times  daily. 12/22/12   Vassie LollMadera, Carlos, MD  cephALEXin (KEFLEX) 500 MG capsule Take 1 capsule (500 mg total) by mouth 4 (four) times daily. 05/24/17   Emi HolesLaw, Croy Drumwright M, PA-C  HYDROcodone-acetaminophen (NORCO/VICODIN) 5-325 MG tablet Take 1-2 tablets by mouth every 6 (six) hours as needed. 05/24/17   Blanton Kardell, Waylan BogaAlexandra M, PA-C  pravastatin (PRAVACHOL) 20 MG tablet Take 20 mg by mouth daily.    [provider]    Family History History reviewed. No pertinent family history.  Social History Social History   Tobacco Use  . Smoking status: Never Smoker  . Smokeless tobacco: Never Used  Substance Use Topics  . Alcohol use: No    Alcohol/week: 0.0 oz  . Drug use: No     Allergies   Contrast media [iodinated diagnostic agents] and Other   Review of Systems Review of Systems  Skin: Positive for wound.  Neurological: Negative for numbness.     Physical Exam Updated Vital Signs BP (!) 147/81   Pulse 64   Temp 98.2 F (36.8 C) (Oral)   Resp 16   SpO2 100%   Physical Exam  Constitutional: She appears well-developed and well-nourished. No distress.  HENT:  Head: Normocephalic and atraumatic.  Mouth/Throat: Oropharynx is clear and moist. No oropharyngeal exudate.  Eyes: Conjunctivae are normal. Pupils are equal, round, and reactive to light. Right eye exhibits no discharge. Left eye exhibits no discharge. No scleral icterus.  Neck: Normal range of motion. Neck supple. No thyromegaly present.  Cardiovascular: Normal rate, regular rhythm, normal heart sounds and intact distal pulses. Exam reveals no gallop and no friction rub.  No murmur heard. Pulmonary/Chest: Effort normal and breath sounds normal. No stridor. No respiratory distress. She has no wheezes. She has no rales.  Musculoskeletal: She exhibits no edema.  Laceration of left index finger distal to PIP, wraps around from palmar aspect to dorsal aspect, no nail involvement, total length about 3 cm Full range of motion of  digit for patient's baseline, as she has had a deficit with flexion of her PIP since she was a child Sensation intact, cap refill less than 2 seconds  Lymphadenopathy:    She has no cervical adenopathy.  Neurological: She is alert. Coordination normal.  Skin: Skin is warm and dry. No rash noted. She is not diaphoretic. No pallor.  Psychiatric: She has a normal mood and affect.  Nursing note and vitals reviewed.    ED Treatments / Results  Labs (all labs ordered are listed, but only abnormal results are displayed) Labs Reviewed - No data to display  EKG  EKG Interpretation None       Radiology Dg Finger Index Left  Result Date: 05/24/2017 CLINICAL DATA:  Laceration with kitchen knife. Index finger pain and swelling. EXAM: LEFT INDEX FINGER 2+V COMPARISON:  None. FINDINGS: There is no evidence of fracture or dislocation. Tiny well corticated ossific density adjacent to the DIP joint is most likely due to chronic trauma. Soft tissue swelling noted, however no evidence of radiopaque foreign body. IMPRESSION: No evidence of fracture or radiopaque foreign body. Electronically Signed   By: Myles Rosenthal M.D.   On: 05/24/2017 20:07    Procedures .Marland KitchenLaceration Repair Date/Time: 05/25/2017 12:56 AM Performed by: Emi Holes, PA-C Authorized by: Emi Holes, PA-C   Consent:    Consent obtained:  Verbal   Consent given by:  Patient   Risks discussed:  Infection, poor cosmetic result and pain   Alternatives discussed:  No treatment Anesthesia (see MAR for exact dosages):    Anesthesia method:  Nerve block   Block location:  L index finger   Block needle gauge:  25 G   Block anesthetic:  Lidocaine 1% w/o epi   Block technique:  Transthecal digital block   Block injection procedure:  Anatomic landmarks identified, introduced needle, incremental injection, negative aspiration for blood and anatomic landmarks palpated   Block outcome:  Anesthesia achieved Laceration details:     Location:  Finger   Finger location:  L index finger   Length (cm):  3   Depth (mm):  7 Repair type:    Repair type:  Intermediate Pre-procedure details:    Preparation:  Patient was prepped and draped in usual sterile fashion and imaging obtained to evaluate for foreign bodies Exploration:    Hemostasis achieved with:  Direct pressure and tourniquet Treatment:    Area cleansed with:  Saline   Amount of cleaning:  Standard   Irrigation solution:  Sterile saline   Irrigation volume:  200 mL   Irrigation method:  Syringe   Visualized foreign bodies/material removed: no   Skin repair:    Repair method:  Sutures   Suture size:  4-0 (and 5-0)   Suture material:  Prolene   Suture technique:  Simple interrupted   Number of sutures:  11 Approximation:    Approximation:  Close   Vermilion border: well-aligned   Post-procedure details:  Dressing:  Antibiotic ointment, splint for protection and non-adherent dressing   Patient tolerance of procedure:  Tolerated well, no immediate complications   (including critical care time)  Medications Ordered in ED Medications  Tdap (BOOSTRIX) injection 0.5 mL (0.5 mLs Intramuscular Given 05/24/17 1930)  lidocaine (PF) (XYLOCAINE) 1 % injection 10 mL (10 mLs Infiltration Given 05/24/17 1930)     Initial Impression / Assessment and Plan / ED Course  I have reviewed the triage vital signs and the nursing notes.  Pertinent labs & imaging results that were available during my care of the patient were reviewed by me and considered in my medical decision making (see chart for details).     Patient with large laceration to L index finger. X-ray is negative for fracture or FB. Tetanus updated in ED. Laceration occurred < 12 hours prior to repair. Discussed laceration care with pt and answered questions. Pt to f-u for suture removal in 7-10 days and wound check sooner should there be signs of dehiscence or infection. Discharge home with Keflex  considering depth, mechanism. Also provided splint for protection. Strict return precautions discussed. Patient understands and agrees with plan. Pt is hemodynamically stable with no complaints prior to dc.  Patient discharged home with short course of Norco considering the depth of the wound.  I reviewed the [place] narcotic database and found no discrepancies.  Patient also evaluated by Dr. Adela LankFloyd who guided the patient's management and agrees with plan.    Final Clinical Impressions(s) / ED Diagnoses   Final diagnoses:  Laceration of left index finger without foreign body without damage to nail, initial encounter    ED Discharge Orders        Ordered    cephALEXin (KEFLEX) 500 MG capsule  4 times daily     05/24/17 2206    HYDROcodone-acetaminophen (NORCO/VICODIN) 5-325 MG tablet  Every 6 hours PRN     05/24/17 2206    bacitracin ointment  2 times daily     05/24/17 2206       Emi HolesLaw, Vaughn Frieze M, PA-C 05/25/17 0103    Melene PlanFloyd, Dan, DO 05/25/17 1521

## 2017-05-24 NOTE — ED Notes (Addendum)
Pt states she cut her pointer finger while cutting a chicken.

## 2017-05-24 NOTE — Discharge Instructions (Signed)
Medications: Keflex, Norco  Treatment: Keep your wound dry and dressing applied until this time tomorrow. After 24 hours, you may wash with warm soapy water. Dry and apply antibiotic ointment (bacitracin) and clean dressing. Do this daily until your sutures are removed. Wear your splint to help protect your finger and the stiches until they are removed.  Do not drink alcohol, drive, operate machinery or participate in any other potentially dangerous activities while taking opiate pain medication as it may make you sleepy. Do not take this medication with any other sedating medications, either prescription or over-the-counter. If you were prescribed Percocet or Vicodin, do not take these with acetaminophen (Tylenol) as it is already contained within these medications and overdose of Tylenol is dangerous.   This medication is an opiate (or narcotic) pain medication and can be habit forming.  Use it as little as possible to achieve adequate pain control.  Do not use or use it with extreme caution if you have a history of opiate abuse or dependence. This medication is intended for your use only - do not give any to anyone else and keep it in a secure place where nobody else, especially children, have access to it. It will also cause or worsen constipation, so you may want to consider taking an over-the-counter stool softener while you are taking this medication.   Follow-up: Please follow-up with your primary care provider or return to emergency department in 7-10 days for suture removal. Be aware of signs of infection: fever, increasing pain, redness, swelling, drainage from the area. Please call your primary care provider or return to emergency department if you develop any of these symptoms or if any of the sutures come out prior to removal. Please return to the emergency department if you develop any other new or worsening symptoms.

## 2017-05-24 NOTE — ED Triage Notes (Signed)
Pt was cutting chicken and cut her left finger with knife. Bleeding controlled. Pt has lac to upper left index finger

## 2017-05-26 NOTE — Patient Outreach (Signed)
Please disregard first letter. Correct letter now submitted for processing.  

## 2017-05-30 ENCOUNTER — Encounter (HOSPITAL_COMMUNITY): Payer: Self-pay | Admitting: Emergency Medicine

## 2017-05-30 ENCOUNTER — Ambulatory Visit (HOSPITAL_COMMUNITY)
Admission: EM | Admit: 2017-05-30 | Discharge: 2017-05-30 | Disposition: A | Discharge status: Home / Self Care | Payer: Medicare Other | Attending: Family Medicine | Admitting: Family Medicine

## 2017-05-30 DIAGNOSIS — S61211A Laceration without foreign body of left index finger without damage to nail, initial encounter: Secondary | ICD-10-CM | POA: Diagnosis not present

## 2017-05-30 MED ORDER — DOXYCYCLINE HYCLATE 100 MG PO TABS: 100.0000 mg | ORAL_TABLET | Freq: Two times a day (BID) | ORAL | 0 refills | Status: DC

## 2017-05-30 NOTE — ED Triage Notes (Signed)
PT cut left forefinger on thanksgiving day. PT is here to have a wound check.

## 2017-05-30 NOTE — Discharge Instructions (Signed)
Return in 2 days for recheck of finger.  Keep finger dry.

## 2017-05-30 NOTE — ED Provider Notes (Signed)
[facility]   [id number] 05/30/17 Arrival Time: 1641   SUBJECTIVE:  Samantha [name] is a 76 y.o. female who presents to the urgent care with complaint of needing wound check 6 days after cutting finger on Thanksgiving.  Finger is more painful last two days.     Past Medical History:  Diagnosis Date  . Asthma   . GERD (gastroesophageal reflux disease)   . H. pylori infection   . Hypercholesteremia   . UTI (lower urinary tract infection)    No family history on file. Social History   Socioeconomic History  . Marital status: Single    Spouse name: Not on file  . Number of children: Not on file  . Years of education: Not on file  . Highest education level: Not on file  Social Needs  . Financial resource strain: Not on file  . Food insecurity - worry: Not on file  . Food insecurity - inability: Not on file  . Transportation needs - medical: Not on file  . Transportation needs - non-medical: Not on file  Occupational History  . Not on file  Tobacco Use  . Smoking status: Never Smoker  . Smokeless tobacco: Never Used  Substance and Sexual Activity  . Alcohol use: No    Alcohol/week: 0.0 oz  . Drug use: No  . Sexual activity: Not on file  Other Topics Concern  . Not on file  Social History Narrative  . Not on file   Current Meds  Medication Sig  . albuterol (PROVENTIL HFA;VENTOLIN HFA) 108 (90 BASE) MCG/ACT inhaler Inhale 2 puffs into the lungs every 6 (six) hours as needed for wheezing or shortness of breath.  . budesonide-formoterol (SYMBICORT) 160-4.5 MCG/ACT inhaler Inhale 2 puffs into the lungs 2 (two) times daily.  . pravastatin (PRAVACHOL) 20 MG tablet Take 20 mg by mouth daily.  . [DISCONTINUED] bacitracin ointment Apply 1 application topically 2 (two) times daily.  . [DISCONTINUED] cephALEXin (KEFLEX) 500 MG capsule Take 1 capsule (500 mg total) by mouth 4 (four) times daily.  . [DISCONTINUED] HYDROcodone-acetaminophen (NORCO/VICODIN) 5-325 MG tablet  Take 1-2 tablets by mouth every 6 (six) hours as needed.   Allergies  Allergen Reactions  . Contrast Media [Iodinated Diagnostic Agents] Other (See Comments)    Patient breaks out in hives pt has had ct scans in the past with premeds and has not had reaction.  . Other Nausea And Vomiting    Seafood allergy-rash      ROS: As per HPI, remainder of ROS negative.   OBJECTIVE:   Vitals:   05/30/17 1705 05/30/17 1706  BP:  131/64  Pulse:  90  Resp:  16  Temp:  98 F (36.7 C)  TempSrc:  Oral  SpO2:  99%  Weight: 114 lb (51.7 kg)      General appearance: alert; no distress Eyes: PERRL; EOMI; conjunctiva normal HENT: normocephalic; atraumatic; TMs normal, canal normal, external ears normal without trauma; nasal mucosa normal; oral mucosa normal Neck: supple Back: no CVA tenderness Extremities: no cyanosis or edema; symmetrical with no gross deformities Skin: macerated left index finger with surrounding erythema and tenderness, mild swelling. Neurologic: normal gait; grossly normal Psychological: alert and cooperative; normal mood and affect      Labs:  Results for orders placed or performed during the hospital encounter of 07/28/16  Urine culture  Result Value Ref Range   Specimen Description URINE, CLEAN CATCH    Special Requests NONE    Culture <10,000 COLONIES/mL  INSIGNIFICANT GROWTH (A)    Report Status 07/29/2016 FINAL   POCT urinalysis dip (device)  Result Value Ref Range   Glucose, UA NEGATIVE NEGATIVE mg/dL   Bilirubin Urine NEGATIVE NEGATIVE   Ketones, ur NEGATIVE NEGATIVE mg/dL   Specific Gravity, Urine 1.010 1.005 - 1.030   Hgb urine dipstick MODERATE (A) NEGATIVE   pH 7.0 5.0 - 8.0   Protein, ur NEGATIVE NEGATIVE mg/dL   Urobilinogen, UA 0.2 0.0 - 1.0 mg/dL   Nitrite NEGATIVE NEGATIVE   Leukocytes, UA LARGE (A) NEGATIVE    Labs Reviewed - No data to display  No results found.     ASSESSMENT & PLAN:  1. Laceration of left index finger with  complication     Meds ordered this encounter  Medications  . doxycycline (VIBRA-TABS) 100 MG tablet    Sig: Take 1 tablet (100 mg total) by mouth 2 (two) times daily.    Dispense:  20 tablet    Refill:  0    Reviewed expectations re: course of current medical issues. Questions answered. Outlined signs and symptoms indicating need for more acute intervention. Patient verbalized understanding. After Visit Summary given.      Elvina SidleLauenstein, Ranisha Allaire, MD 05/30/17 1731

## 2017-06-02 ENCOUNTER — Encounter (HOSPITAL_COMMUNITY): Payer: Self-pay | Admitting: Family Medicine

## 2017-06-02 ENCOUNTER — Ambulatory Visit (HOSPITAL_COMMUNITY)
Admission: EM | Admit: 2017-06-02 | Discharge: 2017-06-02 | Disposition: A | Discharge status: Home / Self Care | Payer: Medicare Other

## 2017-06-02 NOTE — ED Triage Notes (Signed)
11 stiches removed from finger. Clean and dry.

## 2017-06-13 DIAGNOSIS — K219 Gastro-esophageal reflux disease without esophagitis: Secondary | ICD-10-CM | POA: Diagnosis not present

## 2017-06-13 DIAGNOSIS — J45909 Unspecified asthma, uncomplicated: Secondary | ICD-10-CM | POA: Diagnosis not present

## 2017-06-13 DIAGNOSIS — J309 Allergic rhinitis, unspecified: Secondary | ICD-10-CM | POA: Diagnosis not present

## 2017-06-13 DIAGNOSIS — I70213 Atherosclerosis of native arteries of extremities with intermittent claudication, bilateral legs: Secondary | ICD-10-CM | POA: Diagnosis not present

## 2017-06-26 ENCOUNTER — Other Ambulatory Visit: Payer: Self-pay

## 2017-06-26 ENCOUNTER — Ambulatory Visit (HOSPITAL_COMMUNITY)
Admission: EM | Admit: 2017-06-26 | Discharge: 2017-06-26 | Disposition: A | Discharge status: Home / Self Care | Payer: Medicare Other | Attending: Internal Medicine | Admitting: Internal Medicine

## 2017-06-26 ENCOUNTER — Encounter (HOSPITAL_COMMUNITY): Payer: Self-pay | Admitting: Emergency Medicine

## 2017-06-26 DIAGNOSIS — N390 Urinary tract infection, site not specified: Secondary | ICD-10-CM

## 2017-06-26 DIAGNOSIS — R300 Dysuria: Secondary | ICD-10-CM | POA: Diagnosis not present

## 2017-06-26 DIAGNOSIS — N3001 Acute cystitis with hematuria: Secondary | ICD-10-CM | POA: Diagnosis not present

## 2017-06-26 LAB — POCT URINALYSIS DIP (DEVICE)
Bilirubin Urine: NEGATIVE
Glucose, UA: NEGATIVE mg/dL
KETONES UR: NEGATIVE mg/dL
Nitrite: NEGATIVE
PROTEIN: 100 mg/dL — AB
Urobilinogen, UA: 0.2 mg/dL (ref 0.0–1.0)
pH: 6 (ref 5.0–8.0)

## 2017-06-26 MED ORDER — ACETAMINOPHEN 325 MG PO TABS
650.0000 mg | ORAL_TABLET | Freq: Once | ORAL | Status: AC
  Administered 2017-06-26: 650 mg via ORAL

## 2017-06-26 MED ORDER — PHENAZOPYRIDINE HCL 200 MG PO TABS
200.0000 mg | ORAL_TABLET | Freq: Three times a day (TID) | ORAL | 0 refills | Status: DC
Start: 2017-06-26 — End: 2017-08-27

## 2017-06-26 MED ORDER — CEPHALEXIN 500 MG PO CAPS: 500.0000 mg | ORAL_CAPSULE | Freq: Four times a day (QID) | ORAL | 0 refills | Status: DC

## 2017-06-26 MED ORDER — ACETAMINOPHEN 325 MG PO TABS
ORAL_TABLET | ORAL | Status: AC
  Filled 2017-06-26: qty 2

## 2017-06-26 NOTE — ED Triage Notes (Signed)
Abdominal pain and painful urination.

## 2017-06-26 NOTE — Discharge Instructions (Signed)
Go to the drug store for medications

## 2017-06-26 NOTE — ED Notes (Signed)
No answer

## 2017-06-26 NOTE — ED Provider Notes (Signed)
[facility]    CSN: [id number] Arrival date & time: 06/26/17  1345     History   Chief Complaint Chief Complaint  Patient presents with  . Abdominal Pain  . Urinary Tract Infection    HPI Samantha [name] is a 76 y.o. female.   76 year old [place] female speaks English complaining of dysuria with hematuria this started last PM. Also complaining of urinary frequency in small volume voids and urgency. Denies abdominal pain, nausea or vomiting. No fever or chills.      Past Medical History:  Diagnosis Date  . Asthma   . GERD (gastroesophageal reflux disease)   . H. pylori infection   . Hypercholesteremia   . UTI (lower urinary tract infection)     Patient Active Problem List   Diagnosis Date Noted  . Chronic venous insufficiency 05/01/2015  . Varicose veins of lower extremities with complications 05/01/2015  . Colitis 12/19/2012  . Dyslipidemia 12/19/2012  . ASTHMA 02/15/2008  . SHORTNESS OF BREATH (SOB) 02/15/2008  . CHEST PAIN-UNSPECIFIED 02/15/2008    Past Surgical History:  Procedure Laterality Date  . ABDOMINAL HYSTERECTOMY    . ABDOMINAL SURGERY    . CESAREAN SECTION      OB History    No data available       Home Medications    Prior to Admission medications   Medication Sig Start Date End Date Taking? Authorizing Provider  albuterol (PROVENTIL HFA;VENTOLIN HFA) 108 (90 BASE) MCG/ACT inhaler Inhale 2 puffs into the lungs every 6 (six) hours as needed for wheezing or shortness of breath. 07/01/13   Rodolph Bongorey, Evan S, MD  budesonide-formoterol [facility](SYMBICORT) 160-4.5 MCG/ACT inhaler Inhale 2 puffs into the lungs 2 (two) times daily. 12/22/12   Vassie LollMadera, Carlos, MD  cephALEXin (KEFLEX) 500 MG capsule Take 1 capsule (500 mg total) by mouth 4 (four) times daily. 06/26/17   Hayden RasmussenMabe, Lynda Wanninger, NP  phenazopyridine (PYRIDIUM) 200 MG tablet Take 1 tablet (200 mg total) by mouth 3 (three) times daily. May assist with OTC 95mg  sig 2 tid prn urine symptoms if Rx is too  expensive. 06/26/17   Hayden RasmussenMabe, Wilmer Santillo, NP  pravastatin (PRAVACHOL) 20 MG tablet Take 20 mg by mouth daily.    [provider]    Family History Family History  Problem Relation Age of Onset  . Cancer Mother   . Asthma Father     Social History Social History   Tobacco Use  . Smoking status: Never Smoker  . Smokeless tobacco: Never Used  Substance Use Topics  . Alcohol use: No    Alcohol/week: 0.0 oz  . Drug use: No     Allergies   Contrast media [iodinated diagnostic agents] and Other   Review of Systems Review of Systems  Constitutional: Negative.  Negative for fever.  HENT: Negative.   Respiratory: Negative.   Genitourinary: Positive for dysuria, frequency and urgency. Negative for vaginal discharge.  All other systems reviewed and are negative.    Physical Exam Triage Vital Signs ED Triage Vitals  Enc Vitals Group     BP 06/26/17 1418 (!) 164/66     Pulse Rate 06/26/17 1418 64     Resp 06/26/17 1418 20     Temp 06/26/17 1418 97.7 F (36.5 C)     Temp Source 06/26/17 1418 Oral     SpO2 06/26/17 1418 100 %     Weight --      Height --      Head Circumference --  Peak Flow --      Pain Score 06/26/17 1415 10     Pain Loc --      Pain Edu? --      Excl. in GC? --    No data found.  Updated Vital Signs BP (!) 164/66 (BP Location: Left Arm)   Pulse 64   Temp 97.7 F (36.5 C) (Oral)   Resp 20   SpO2 100%   Visual Acuity Right Eye Distance:   Left Eye Distance:   Bilateral Distance:    Right Eye Near:   Left Eye Near:    Bilateral Near:     Physical Exam  Constitutional: She is oriented to person, place, and time. She appears well-developed and well-nourished.  Non-toxic appearance. She does not appear ill. No distress.  Eyes: EOM are normal.  Neck: Normal range of motion. Neck supple.  Cardiovascular: Normal rate.  Pulmonary/Chest: Effort normal. No respiratory distress.  Abdominal: Soft. Normal appearance and bowel sounds are  normal. She exhibits no distension and no ascites. There is no rebound and no guarding.  No abdominal tenderness. Palpation to the anterior mid suprapubic area with tenderness.  Musculoskeletal: She exhibits no edema.  Neurological: She is alert and oriented to person, place, and time. She exhibits normal muscle tone.  Skin: Skin is warm and dry.  Psychiatric: She has a normal mood and affect.  Nursing note and vitals reviewed.    UC Treatments / Results  Labs (all labs ordered are listed, but only abnormal results are displayed) Labs Reviewed  POCT URINALYSIS DIP (DEVICE) - Abnormal; Notable for the following components:      Result Value   Hgb urine dipstick LARGE (*)    Protein, ur 100 (*)    Leukocytes, UA SMALL (*)    All other components within normal limits    EKG  EKG Interpretation None       Radiology No results found.  Procedures Procedures (including critical care time)  Medications Ordered in UC Medications  acetaminophen (TYLENOL) tablet 650 mg (650 mg Oral Given 06/26/17 1421)     Initial Impression / Assessment and Plan / UC Course  I have reviewed the triage vital signs and the nursing notes.  Pertinent labs & imaging results that were available during my care of the patient were reviewed by me and considered in my medical decision making (see chart for details).    Go to the drug store for medications    Final Clinical Impressions(s) / UC Diagnoses   Final diagnoses:  Lower urinary tract infectious disease  Acute cystitis with hematuria    ED Discharge Orders        Ordered    cephALEXin (KEFLEX) 500 MG capsule  4 times daily     06/26/17 1539    phenazopyridine (PYRIDIUM) 200 MG tablet  3 times daily     06/26/17 1539       Controlled Substance Prescriptions [place] Controlled Substance Registry consulted? Not Applicable   Hayden RasmussenMabe, Denesha Brouse, NP 06/26/17 1544

## 2017-06-26 NOTE — ED Notes (Signed)
Patient asking for pain medicine/mentioned this patient to this nurse by patient access

## 2017-07-12 DIAGNOSIS — J029 Acute pharyngitis, unspecified: Secondary | ICD-10-CM | POA: Diagnosis not present

## 2017-07-12 DIAGNOSIS — J309 Allergic rhinitis, unspecified: Secondary | ICD-10-CM | POA: Diagnosis not present

## 2017-07-12 DIAGNOSIS — R040 Epistaxis: Secondary | ICD-10-CM | POA: Diagnosis not present

## 2017-08-02 DIAGNOSIS — J45909 Unspecified asthma, uncomplicated: Secondary | ICD-10-CM | POA: Diagnosis not present

## 2017-08-02 DIAGNOSIS — R079 Chest pain, unspecified: Secondary | ICD-10-CM | POA: Diagnosis not present

## 2017-08-07 ENCOUNTER — Encounter: Payer: Self-pay | Admitting: Allergy and Immunology

## 2017-08-27 ENCOUNTER — Emergency Department (HOSPITAL_COMMUNITY)
Admission: EM | Admit: 2017-08-27 | Discharge: 2017-08-27 | Disposition: A | Discharge status: Home / Self Care | Payer: Medicare Other | Attending: Emergency Medicine | Admitting: Emergency Medicine

## 2017-08-27 ENCOUNTER — Encounter (HOSPITAL_COMMUNITY): Payer: Self-pay | Admitting: Emergency Medicine

## 2017-08-27 DIAGNOSIS — Z79899 Other long term (current) drug therapy: Secondary | ICD-10-CM | POA: Diagnosis not present

## 2017-08-27 DIAGNOSIS — J45909 Unspecified asthma, uncomplicated: Secondary | ICD-10-CM | POA: Diagnosis not present

## 2017-08-27 DIAGNOSIS — N3001 Acute cystitis with hematuria: Secondary | ICD-10-CM

## 2017-08-27 DIAGNOSIS — R319 Hematuria, unspecified: Secondary | ICD-10-CM | POA: Diagnosis not present

## 2017-08-27 DIAGNOSIS — R350 Frequency of micturition: Secondary | ICD-10-CM | POA: Diagnosis present

## 2017-08-27 LAB — URINALYSIS, ROUTINE W REFLEX MICROSCOPIC
Bilirubin Urine: NEGATIVE
Glucose, UA: NEGATIVE mg/dL
KETONES UR: NEGATIVE mg/dL
Nitrite: NEGATIVE
Protein, ur: 100 mg/dL — AB
SPECIFIC GRAVITY, URINE: 1.004 — AB (ref 1.005–1.030)
Squamous Epithelial / LPF: NONE SEEN
pH: 7 (ref 5.0–8.0)

## 2017-08-27 MED ORDER — CEPHALEXIN 500 MG PO CAPS: 500.0000 mg | ORAL_CAPSULE | Freq: Four times a day (QID) | ORAL | 0 refills | Status: DC

## 2017-08-27 MED ORDER — PHENAZOPYRIDINE HCL 200 MG PO TABS: 200.0000 mg | ORAL_TABLET | Freq: Three times a day (TID) | ORAL | 0 refills | Status: DC

## 2017-08-27 MED ORDER — CEPHALEXIN 250 MG PO CAPS
500.0000 mg | ORAL_CAPSULE | Freq: Once | ORAL | Status: AC
Start: 2017-08-27 — End: 2017-08-27
  Administered 2017-08-27: 500 mg via ORAL
  Filled 2017-08-27: qty 2

## 2017-08-27 MED ORDER — PHENAZOPYRIDINE HCL 100 MG PO TABS
200.0000 mg | ORAL_TABLET | Freq: Once | ORAL | Status: AC
  Administered 2017-08-27: 200 mg via ORAL
  Filled 2017-08-27: qty 2

## 2017-08-27 NOTE — ED Triage Notes (Signed)
Pt to ER reports urinary difficulty onset this morning, states a lot of pressure with urination and visible blood in toilet after. States burning and stinging.

## 2017-08-27 NOTE — ED Provider Notes (Signed)
[facility]   CSN: [id number] Arrival date & time: 08/27/17  [id number]     History   Chief Complaint Chief Complaint  Patient presents with  . Urinary Tract Infection    HPI Samantha [name] is a 77 y.o. female.  Pt reports to the ED today with dysuria, hematuria, and frequency.  Pt gets frequent UTIs since the 1990s.  She said sx started this morning.  She denies any n/v.        Past Medical History:  Diagnosis Date  . Asthma   . GERD (gastroesophageal reflux disease)   . H. pylori infection   . Hypercholesteremia   . UTI (lower urinary tract infection)     Patient Active Problem List   Diagnosis Date Noted  . Chronic venous insufficiency 05/01/2015  . Varicose veins of lower extremities with complications 05/01/2015  . Colitis 12/19/2012  . Dyslipidemia 12/19/2012  . ASTHMA 02/15/2008  . SHORTNESS OF BREATH (SOB) 02/15/2008  . CHEST PAIN-UNSPECIFIED 02/15/2008    Past Surgical History:  Procedure Laterality Date  . ABDOMINAL HYSTERECTOMY    . ABDOMINAL SURGERY    . CESAREAN SECTION      OB History    No data available       Home Medications    Prior to Admission medications   Medication Sig Start Date End Date Taking? Authorizing Provider  albuterol (PROVENTIL HFA;VENTOLIN HFA) 108 (90 BASE) MCG/ACT inhaler Inhale 2 puffs into the lungs every 6 (six) hours as needed for wheezing or shortness of breath. 07/01/13   Rodolph Bongorey, Evan S, MD  budesonide-formoterol [facility](SYMBICORT) 160-4.5 MCG/ACT inhaler Inhale 2 puffs into the lungs 2 (two) times daily. 12/22/12   Vassie LollMadera, Carlos, MD  cephALEXin (KEFLEX) 500 MG capsule Take 1 capsule (500 mg total) by mouth 4 (four) times daily. 08/27/17   Jacalyn LefevreHaviland, Roran Wegner, MD  phenazopyridine (PYRIDIUM) 200 MG tablet Take 1 tablet (200 mg total) by mouth 3 (three) times daily. May assist with OTC 95mg  sig 2 tid prn urine symptoms if Rx is too expensive. 08/27/17   Jacalyn LefevreHaviland, Michaelle Bottomley, MD    pravastatin (PRAVACHOL) 20 MG tablet Take 20 mg by mouth daily.    [provider]    Family History Family History  Problem Relation Age of Onset  . Cancer Mother   . Asthma Father     Social History Social History   Tobacco Use  . Smoking status: Never Smoker  . Smokeless tobacco: Never Used  Substance Use Topics  . Alcohol use: No    Alcohol/week: 0.0 oz  . Drug use: No     Allergies   Contrast media [iodinated diagnostic agents] and Other   Review of Systems Review of Systems  Genitourinary: Positive for dysuria, frequency and hematuria.  All other systems reviewed and are negative.    Physical Exam Updated Vital Signs BP (!) 150/71 (BP Location: Right Arm)   Pulse 65   Temp 97.7 F (36.5 C) (Oral)   Resp 17   SpO2 100%   Physical Exam  Constitutional: She is oriented to person, place, and time. She appears well-developed and well-nourished.  HENT:  Head: Normocephalic and atraumatic.  Right Ear: External ear normal.  Left Ear: External ear normal.  Nose: Nose normal.  Mouth/Throat: Oropharynx is clear and moist.  Eyes: Conjunctivae and EOM are normal. Pupils are equal, round, and reactive to light.  Neck: Normal range of motion.  Cardiovascular: Normal rate, regular rhythm, normal heart sounds  and intact distal pulses.  Pulmonary/Chest: Effort normal and breath sounds normal.  Abdominal: Soft. Bowel sounds are normal. There is tenderness in the suprapubic area.  Musculoskeletal: Normal range of motion.  Neurological: She is alert and oriented to person, place, and time.  Skin: Skin is warm. Capillary refill takes less than 2 seconds.  Psychiatric: She has a normal mood and affect. Her behavior is normal. Judgment and thought content normal.  Nursing note and vitals reviewed.    ED Treatments / Results  Labs (all labs ordered are listed, but only abnormal results are displayed) Labs Reviewed  URINALYSIS, ROUTINE W REFLEX MICROSCOPIC -  Abnormal; Notable for the following components:      Result Value   APPearance CLOUDY (*)    Specific Gravity, Urine 1.004 (*)    Hgb urine dipstick LARGE (*)    Protein, ur 100 (*)    Leukocytes, UA LARGE (*)    Bacteria, UA FEW (*)    Non Squamous Epithelial 0-5 (*)    All other components within normal limits  URINE CULTURE    EKG  EKG Interpretation None       Radiology No results found.  Procedures Procedures (including critical care time)  Medications Ordered in ED Medications  cephALEXin (KEFLEX) capsule 500 mg (not administered)  phenazopyridine (PYRIDIUM) tablet 200 mg (not administered)     Initial Impression / Assessment and Plan / ED Course  I have reviewed the triage vital signs and the nursing notes.  Pertinent labs & imaging results that were available during my care of the patient were reviewed by me and considered in my medical decision making (see chart for details).   Pt is worried that antibiotics won't help here, but we will start her on keflex and send urine for a culture.  I am going to give her the number of urology to f/u since she gets these infections so frequently.  The pt knows to return if worse.   Final Clinical Impressions(s) / ED Diagnoses   Final diagnoses:  Acute cystitis with hematuria    ED Discharge Orders        Ordered    cephALEXin (KEFLEX) 500 MG capsule  4 times daily     08/27/17 1050    phenazopyridine (PYRIDIUM) 200 MG tablet  3 times daily     08/27/17 1050       Jacalyn Lefevre, MD 08/27/17 1050

## 2017-08-27 NOTE — ED Notes (Signed)
Patient states "This won't work, I still have antibiotic left over from urgent care.  It didn't work."  Patient instructed she needs to complete the entire course of antibiotics. Patient expressed understanding.

## 2017-08-28 LAB — URINE CULTURE: CULTURE: NO GROWTH

## 2017-09-12 DIAGNOSIS — E785 Hyperlipidemia, unspecified: Secondary | ICD-10-CM | POA: Diagnosis not present

## 2017-09-12 DIAGNOSIS — R319 Hematuria, unspecified: Secondary | ICD-10-CM | POA: Diagnosis not present

## 2017-09-12 DIAGNOSIS — N390 Urinary tract infection, site not specified: Secondary | ICD-10-CM | POA: Diagnosis not present

## 2017-09-12 DIAGNOSIS — E559 Vitamin D deficiency, unspecified: Secondary | ICD-10-CM | POA: Diagnosis not present

## 2017-09-26 ENCOUNTER — Ambulatory Visit: Payer: Medicare Other | Admitting: Allergy and Immunology

## 2017-09-26 ENCOUNTER — Telehealth: Payer: Self-pay | Admitting: *Deleted

## 2017-09-26 ENCOUNTER — Other Ambulatory Visit: Payer: Self-pay | Admitting: Allergy and Immunology

## 2017-09-26 ENCOUNTER — Encounter: Payer: Self-pay | Admitting: Allergy and Immunology

## 2017-09-26 VITALS — BP 112/80 | HR 60 | Temp 97.5°F | Resp 16 | Ht 59.0 in | Wt 125.4 lb

## 2017-09-26 DIAGNOSIS — Z91013 Allergy to seafood: Secondary | ICD-10-CM | POA: Diagnosis not present

## 2017-09-26 DIAGNOSIS — R040 Epistaxis: Secondary | ICD-10-CM | POA: Diagnosis not present

## 2017-09-26 DIAGNOSIS — J4550 Severe persistent asthma, uncomplicated: Secondary | ICD-10-CM

## 2017-09-26 DIAGNOSIS — K219 Gastro-esophageal reflux disease without esophagitis: Secondary | ICD-10-CM

## 2017-09-26 MED ORDER — EPINEPHRINE 0.3 MG/0.3ML IJ SOAJ: INTRAMUSCULAR | 2 refills | Status: DC

## 2017-09-26 MED ORDER — RANITIDINE HCL 300 MG PO CAPS: 300.0000 mg | ORAL_CAPSULE | Freq: Every evening | ORAL | 5 refills | Status: DC

## 2017-09-26 MED ORDER — TIOTROPIUM BROMIDE MONOHYDRATE 1.25 MCG/ACT IN AERS: 2.0000 | INHALATION_SPRAY | Freq: Every day | RESPIRATORY_TRACT | 3 refills | Status: DC

## 2017-09-26 NOTE — Progress Notes (Signed)
Dear Dr. Allyne Gee,  Thank you for referring Adrielle Polakowski to the [facility] [place] on 09/26/2017.   Below is a summation of this patient's evaluation and recommendations.  Thank you for your referral. I will keep you informed about this patient's response to treatment.   If you have any questions please do not hesitate to contact me.   Sincerely,  Jessica Priest, MD Allergy / Immunology [facility]   ______________________________________________________________________    NEW PATIENT NOTE  Referring Provider: Dorothyann Peng, MD Primary Provider: Dorothyann Peng, MD Date of office visit: 09/26/2017    Subjective:   Chief Complaint:  Samantha [name] (DOB: 09/02/1940) is a 77 y.o. female who presents to the clinic on 09/26/2017 with a chief complaint of Allergy Testing (allergies) and Asthma .     HPI: Jeilyn presents to this clinic in evaluation of breathing problems.  She is here today with a Nurse, learning disability.  Apparently she has been with a long history of breathing problems dating back over a decade and was seen in this clinic around that point in time and treated with multiple medications and did relatively well until 2018 at which point in time she has developed significant problems with shortness of breath and coughing and scratchy throat and throat clearing and feeling as though there is something stuck in her throat.  She does not have any associated wheezing or chest pain or sputum production.  She does not have a significant amount of upper airway symptoms specifically no anosmia or ugly nasal discharge or headache though she does have intermittent epistaxis.  Her shortness of breath is defined by walking for several minutes and then getting out of the air and her recovery time is approximately 2 minutes or so.  She does have reflux disease that appears to be active even in the face of utilizing a proton  pump inhibitor.  She drinks 3 strong cups of coffee per day and does not consume any chocolate or alcohol.  She can swallow food and drink without any problem at all and she does not have coughing spells when swallowing or drinking.  She was apparently treated for Helicobacter pylori infection sometime over the course of the past several years.  As noted above she has been having bleeding from her nose and it appears as though it is occurring from both nostrils over the course of the past 3 years.  She has a history of seafood allergy defined by pruritis and hives  Past Medical History:  Diagnosis Date  . Asthma   . GERD (gastroesophageal reflux disease)   . H. pylori infection   . Hypercholesteremia   . UTI (lower urinary tract infection)     Past Surgical History:  Procedure Laterality Date  . ABDOMINAL HYSTERECTOMY    . ABDOMINAL SURGERY    . CESAREAN SECTION      Allergies as of 09/26/2017      Reactions   Contrast Media [iodinated Diagnostic Agents] Other (See Comments)   Patient breaks out in hives pt has had ct scans in the past with premeds and has not had reaction.   Other Nausea And Vomiting   Seafood allergy-rash      Medication List      albuterol 108 (90 Base) MCG/ACT inhaler Commonly known as:  PROVENTIL HFA;VENTOLIN HFA Inhale 2 puffs into the lungs every 6 (six) hours as needed for wheezing or shortness of  breath.   budesonide-formoterol 160-4.5 MCG/ACT inhaler Commonly known as:  SYMBICORT Inhale 2 puffs into the lungs 2 (two) times daily.   esomeprazole 40 MG capsule Commonly known as:  NEXIUM   pravastatin 20 MG tablet Commonly known as:  PRAVACHOL Take 20 mg by mouth daily.       Review of systems negative except as noted in HPI / PMHx or noted below:  Review of Systems  Constitutional: Negative.   HENT: Negative.   Eyes: Negative.   Respiratory: Negative.   Cardiovascular: Negative.   Gastrointestinal: Negative.   Genitourinary:  Negative.   Musculoskeletal: Negative.   Skin: Negative.   Neurological: Negative.   Endo/Heme/Allergies: Negative.   Psychiatric/Behavioral: Negative.     Family History  Problem Relation Age of Onset  . Cancer Mother   . Asthma Father   . Allergic rhinitis Father   . Food Allergy Father   . Urticaria Neg Hx   . Eczema Neg Hx   . Angioedema Neg Hx     Social History   Socioeconomic History  . Marital status: Married    Spouse name: Not on file  . Number of children: Not on file  . Years of education: Not on file  . Highest education level: Not on file  Occupational History  . Not on file  Social Needs  . Financial resource strain: Not on file  . Food insecurity:    Worry: Not on file    Inability: Not on file  . Transportation needs:    Medical: Not on file    Non-medical: Not on file  Tobacco Use  . Smoking status: Never Smoker  . Smokeless tobacco: Never Used  Substance and Sexual Activity  . Alcohol use: No    Alcohol/week: 0.0 oz  . Drug use: No  . Sexual activity: Not on file  Lifestyle  . Physical activity:    Days per week: Not on file    Minutes per session: Not on file  . Stress: Not on file  Relationships  . Social connections:    Talks on phone: Not on file    Gets together: Not on file    Attends religious service: Not on file    Active member of club or organization: Not on file    Attends meetings of clubs or organizations: Not on file    Relationship status: Not on file  . Intimate partner violence:    Fear of current or ex partner: Not on file    Emotionally abused: Not on file    Physically abused: Not on file    Forced sexual activity: Not on file  Other Topics Concern  . Not on file  Social History Narrative  . Not on file    Environmental and Social history  Lives in a house with a dry environment, no animals located inside the household, no carpet in the bedroom, no plastic on the bed or pillow, and no smoking ongoing with  inside the household.  Objective:   Vitals:   09/26/17 0818  BP: 112/80  Pulse: 60  Resp: 16  Temp: (!) 97.5 F (36.4 C)  SpO2: 98%   Height: 4\' 11"  (149.9 cm) Weight: 125 lb 6.4 oz (56.9 kg)  Physical Exam  Constitutional: She is well-developed, well-nourished, and in no distress.  HENT:  Head: Normocephalic. Head is without right periorbital erythema and without left periorbital erythema.  Right Ear: Tympanic membrane, external ear and ear canal normal.  Left Ear:  Tympanic membrane, external ear and ear canal normal.  Nose: Nose normal. No mucosal edema or rhinorrhea.  Mouth/Throat: Uvula is midline, oropharynx is clear and moist and mucous membranes are normal. No oropharyngeal exudate.  Eyes: Pupils are equal, round, and reactive to light. Conjunctivae and lids are normal.  Neck: Trachea normal. No tracheal tenderness present. No tracheal deviation present. No thyromegaly present.  Cardiovascular: Normal rate, regular rhythm, S1 normal, S2 normal and normal heart sounds.  No murmur heard. Pulmonary/Chest: Effort normal and breath sounds normal. No stridor. No tachypnea. No respiratory distress. She has no wheezes. She has no rales. She exhibits no tenderness.  Abdominal: Soft. She exhibits no distension and no mass. There is no hepatosplenomegaly. There is no tenderness. There is no rebound and no guarding.  Musculoskeletal: She exhibits no edema or tenderness.  Lymphadenopathy:       Head (right side): No tonsillar adenopathy present.       Head (left side): No tonsillar adenopathy present.    She has no cervical adenopathy.    She has no axillary adenopathy.  Neurological: She is alert. Gait normal.  Skin: No rash noted. She is not diaphoretic. No erythema. No pallor. Nails show no clubbing.  Psychiatric: Mood and affect normal.    Diagnostics: Allergy skin tests were performed.  She demonstrated hypersensitivity to grasses, weeds, trees, and house dust  mite.  Spirometry was performed and demonstrated an FEV1 of 1.17 @ 68 % of predicted. FEV1/FVC = 0.76.  Following administration of nebulized albuterol her FEV1 did not increase.  Results of blood tests obtained 26 October 2014 identified WBC 6.6, absolute eosinophil 1600, absolute lymphocyte 2100, hemoglobin 11.5, platelet 252.  Results of a chest x-ray obtained 18 December 2012 identified the following:  Lung volumes are normal.  No consolidative airspace disease.  No pleural effusions.  No pneumothorax.  No pulmonary nodule or mass noted.  Pulmonary vasculature and the cardiomediastinal silhouette are within normal limits. Atherosclerosis in the thoracic aorta.   Assessment and Plan:    1. Not well controlled severe persistent asthma   2. Epistaxis   3. Seafood allergy   4. LPRD (laryngopharyngeal reflux disease)     1.  Allergen avoidance measures  2.  Treat and prevent inflammation:   A.  Symbicort 160 - 2 inhalations twice a day with spacer  B.  Spiriva 1.25 Respimat - 2 inhalations 1 time per day  3.  Treat and prevent reflux:   A.  esomeprazole 40 mg in a.m.  B.  Ranitidine 300 mg in p.m.  C.  Attempt to taper off all caffeine consumption  4.  If needed:   A.  Proventil HFA or similar 2 puffs every 4-6 hours  B.  Nasal saline  C. Epi-Pen  5.  Obtain a chest x-ray  6.  Obtain blood - CBC w/diff, shellfish panel, fish panel  7.  ENT evaluation for LPR and epistaxis  8.  Biological agent for eosinophilia?  9.  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  Her reflux also does not appear to be under good control and there may be a component of reflux induced respiratory disease and she will increase her therapy for  reflux as noted above.  She will  most likely be a candidate for a biological agent directed against eosinophilia pending her response of therapy noted above.  I would like for her to get a chest x-ray and also have her throat evaluated by ENT and also have her upper airway evaluated by ENT for her LPR and epistaxis respectively.  We will work through her food allergy with the blood tests noted above.  I will see her back in this clinic in 3 weeks or earlier if there is a problem.  Jessica Priest, MD Allergy / Immunology [facility] [place]

## 2017-09-26 NOTE — Telephone Encounter (Signed)
Dr Lucie LeatherKozlow would like an ENT referral for LPR and epistaxis

## 2017-09-26 NOTE — Patient Instructions (Addendum)
  1.  Allergen avoidance measures  2.  Treat and prevent inflammation:   A.  Symbicort 160 - 2 inhalations twice a day with spacer  B.  Spiriva 1.25 Respimat - 2 inhalations 1 time per day  3.  Treat and prevent reflux:   A.  esomeprazole 40 mg in a.m.  B.  Ranitidine 300 mg in p.m.  C.  Attempt to taper off all caffeine consumption  4.  If needed:   A.  Proventil HFA or similar 2 puffs every 4-6 hours  B.  Nasal saline  C. Epi-Pen  5.  Obtain a chest x-ray  6.  Obtain blood - CBC w/diff, shellfish panel, fish panel  7.  ENT evaluation for LPR and epistaxis  8.  Biological agent for eosinophilia?  9.  Return to clinic in 3 weeks or earlier if problem

## 2017-09-27 ENCOUNTER — Encounter: Payer: Self-pay | Admitting: Allergy and Immunology

## 2017-09-27 NOTE — Telephone Encounter (Signed)
Referral faxed to Dr Suszanne Connerseoh.

## 2017-09-28 LAB — CBC WITH DIFFERENTIAL/PLATELET
Basophils Absolute: 0.1 10*3/uL (ref 0.0–0.2)
Basos: 1 %
EOS (ABSOLUTE): 1.9 10*3/uL — ABNORMAL HIGH (ref 0.0–0.4)
Eos: 22 %
HEMOGLOBIN: 11.6 g/dL (ref 11.1–15.9)
Hematocrit: 37.7 % (ref 34.0–46.6)
Immature Grans (Abs): 0 10*3/uL (ref 0.0–0.1)
Immature Granulocytes: 0 %
LYMPHS ABS: 3.1 10*3/uL (ref 0.7–3.1)
Lymphs: 36 %
MCH: 25.6 pg — AB (ref 26.6–33.0)
MCHC: 30.8 g/dL — AB (ref 31.5–35.7)
MCV: 83 fL (ref 79–97)
Monocytes Absolute: 0.3 10*3/uL (ref 0.1–0.9)
Monocytes: 3 %
NEUTROS ABS: 3.3 10*3/uL (ref 1.4–7.0)
Neutrophils: 38 %
Platelets: 269 10*3/uL (ref 150–379)
RBC: 4.54 x10E6/uL (ref 3.77–5.28)
RDW: 15.4 % (ref 12.3–15.4)
WBC: 8.6 10*3/uL (ref 3.4–10.8)

## 2017-09-28 NOTE — Addendum Note (Signed)
Addended by: Dub MikesHICKS, ASHLEY N on: 09/28/2017 09:21 AM   Modules accepted: Orders

## 2017-09-29 ENCOUNTER — Other Ambulatory Visit: Payer: Self-pay | Admitting: *Deleted

## 2017-09-29 MED ORDER — RANITIDINE HCL 300 MG PO TABS: ORAL_TABLET | ORAL | 5 refills | Status: DC

## 2017-10-01 LAB — ALLERGEN PROFILE, FOOD-FISH
Allergen Salmon IgE: <0.1 kU/L
Allergen Trout IgE: <0.1 kU/L
Codfish IgE: <0.1 kU/L
F303-IGE HALIBUT: 0.18 kU/L — AB
F415-IGE WALLEYE PIKE: 0.16 kU/L — AB
Tuna: <0.1 kU/L

## 2017-10-01 LAB — ALLERGEN PROFILE, SHELLFISH
Clam IgE: 0.9 kU/L — AB
F023-IgE Crab: 2.72 kU/L — AB
F080-IGE LOBSTER: 2.24 kU/L — AB
F290-IgE Oyster: 1.01 kU/L — AB
SHRIMP IGE: 4.11 kU/L — AB
Scallop IgE: 1.07 kU/L — AB

## 2017-10-18 ENCOUNTER — Encounter: Payer: Self-pay | Admitting: Allergy and Immunology

## 2017-10-18 ENCOUNTER — Ambulatory Visit: Payer: Medicare Other | Admitting: Allergy and Immunology

## 2017-10-18 ENCOUNTER — Other Ambulatory Visit: Payer: Self-pay

## 2017-10-18 ENCOUNTER — Emergency Department (HOSPITAL_COMMUNITY): Payer: Medicare Other

## 2017-10-18 ENCOUNTER — Encounter (HOSPITAL_COMMUNITY): Payer: Self-pay

## 2017-10-18 ENCOUNTER — Emergency Department (HOSPITAL_COMMUNITY)
Admission: EM | Admit: 2017-10-18 | Discharge: 2017-10-19 | Disposition: A | Discharge status: Home / Self Care | Payer: Medicare Other | Attending: Emergency Medicine | Admitting: Emergency Medicine

## 2017-10-18 VITALS — BP 108/66 | HR 64 | Resp 16

## 2017-10-18 DIAGNOSIS — J3089 Other allergic rhinitis: Secondary | ICD-10-CM

## 2017-10-18 DIAGNOSIS — K219 Gastro-esophageal reflux disease without esophagitis: Secondary | ICD-10-CM

## 2017-10-18 DIAGNOSIS — R531 Weakness: Secondary | ICD-10-CM | POA: Diagnosis not present

## 2017-10-18 DIAGNOSIS — Z5321 Procedure and treatment not carried out due to patient leaving prior to being seen by health care provider: Secondary | ICD-10-CM | POA: Insufficient documentation

## 2017-10-18 DIAGNOSIS — Z91013 Allergy to seafood: Secondary | ICD-10-CM | POA: Diagnosis not present

## 2017-10-18 DIAGNOSIS — R404 Transient alteration of awareness: Secondary | ICD-10-CM | POA: Diagnosis not present

## 2017-10-18 DIAGNOSIS — J4550 Severe persistent asthma, uncomplicated: Secondary | ICD-10-CM | POA: Diagnosis not present

## 2017-10-18 DIAGNOSIS — R079 Chest pain, unspecified: Secondary | ICD-10-CM | POA: Diagnosis not present

## 2017-10-18 DIAGNOSIS — J029 Acute pharyngitis, unspecified: Secondary | ICD-10-CM | POA: Diagnosis not present

## 2017-10-18 LAB — I-STAT TROPONIN, ED: TROPONIN I, POC: 0 ng/mL (ref 0.00–0.08)

## 2017-10-18 LAB — BASIC METABOLIC PANEL
Anion gap: 11 (ref 5–15)
BUN: 7 mg/dL (ref 6–20)
CALCIUM: 9.1 mg/dL (ref 8.9–10.3)
CO2: 20 mmol/L — ABNORMAL LOW (ref 22–32)
CREATININE: 0.59 mg/dL (ref 0.44–1.00)
Chloride: 104 mmol/L (ref 101–111)
GFR calc Af Amer: >60 mL/min (ref >60)
GFR calc non Af Amer: >60 mL/min (ref >60)
GLUCOSE: 117 mg/dL — AB (ref 65–99)
Potassium: 3.6 mmol/L (ref 3.5–5.1)
Sodium: 135 mmol/L (ref 135–145)

## 2017-10-18 LAB — CBC
HCT: 36.1 % (ref 36.0–46.0)
Hemoglobin: 11.6 g/dL — ABNORMAL LOW (ref 12.0–15.0)
MCH: 25.6 pg — AB (ref 26.0–34.0)
MCHC: 32.1 g/dL (ref 30.0–36.0)
MCV: 79.7 fL (ref 78.0–100.0)
Platelets: 230 10*3/uL (ref 150–400)
RBC: 4.53 MIL/uL (ref 3.87–5.11)
RDW: 14.1 % (ref 11.5–15.5)
WBC: 7.3 10*3/uL (ref 4.0–10.5)

## 2017-10-18 MED ORDER — BENRALIZUMAB 30 MG/ML ~~LOC~~ SOSY
30.0000 mg | PREFILLED_SYRINGE | SUBCUTANEOUS | Status: AC
  Administered 2017-10-18: 30 mg via SUBCUTANEOUS

## 2017-10-18 NOTE — Progress Notes (Signed)
Immunotherapy   Patient Details  Name: Eloise HarmanHden Hilbert MRN: [id number] Date of Birth: [birth year]  10/18/2017  Eloise HarmanHden Devall started injections for  Fasenra  Frequency:every 4 weeks then every 8 weeks Epi-Pen:Epi-Pen Available  Consent signed and patient instructions given. Patient could not wait 1 hour advised she had to wait patient left without waiting  Bennye AlmMildred Kelcey Wickstrom 10/18/2017, 12:12 PM

## 2017-10-18 NOTE — ED Triage Notes (Signed)
Pt endorses going to allergy clinic earlier today and given injections, pt now complains of sore throat, chest (has been going on a long time) and is "hot then cold" VSS.

## 2017-10-18 NOTE — Patient Instructions (Addendum)
  1.  Continue to perform Allergen avoidance measures - No seafood  2.  Continue to Treat and prevent inflammation:   A.  Symbicort 160 - 2 inhalations twice a day with spacer  B.  Spiriva 1.25 Respimat - 2 inhalations 1 time per day  C.  Flonase - 1 spray each nostril one time per day  D.  Fasenra injection every 4 weeks - sample today  3.  Continue to Treat and prevent reflux:   A.  esomeprazole 40 mg in a.m.  B.  Ranitidine 300 mg in p.m.  C.  Attempt to taper off all caffeine consumption  4.  If needed:   A.  Proventil HFA or similar 2 puffs every 4-6 hours  B.  Nasal saline  C.  Epi-Pen  5. Return to clinic in 4 weeks

## 2017-10-18 NOTE — Progress Notes (Signed)
Follow-up Note  Referring Provider: Dorothyann PengSanders, Robyn, MD Primary Provider: Dorothyann PengSanders, Robyn, MD Date of Office Visit: 10/18/2017  Subjective:   Samantha [name] (DOB: Sep 13, 1940) is a 77 y.o. female [facility] in re-evaluation of the following:  HPI: Samantha [name] returns to this clinic in reevaluation of her asthma and allergic rhinitis and LPR and food allergy.  She was last evaluated in this clinic 26 September 2017 as her initial evaluation at which point in time we assigned a plan to address each issue.  She is better.  She still gets very dyspneic when she exerts herself but she has not had as much wheezing and coughing and does not need to use her short acting bronchodilator just a few times a week.  Her nose is doing okay although she does have some congestion.  She still occasionally has some issues with throat clearing.  She thinks that her reflux is doing well.  She still continues to drink 3 strong cups of coffee per day.  She remains away from all seafood consumption.  Allergies as of 10/18/2017      Reactions   Contrast Media [iodinated Diagnostic Agents] Other (See Comments)   Patient breaks out in hives pt has had ct scans in the past with premeds and has not had reaction.   Other Nausea And Vomiting   Seafood allergy-rash      Medication List      albuterol 108 (90 Base) MCG/ACT inhaler Commonly known as:  PROVENTIL HFA;VENTOLIN HFA Inhale 2 puffs into the lungs every 6 (six) hours as needed for wheezing or shortness of breath.   budesonide-formoterol 160-4.5 MCG/ACT inhaler Commonly known as:  SYMBICORT Inhale 2 puffs into the lungs 2 (two) times daily.   EPINEPHrine 0.3 mg/0.3 mL Soaj injection Commonly known as:  EPI-PEN Use as directed for severe allergic reaction   esomeprazole 40 MG capsule Commonly known as:  NEXIUM   pravastatin 20 MG tablet Commonly known as:  PRAVACHOL Take 20 mg by mouth daily.   ranitidine 300  MG tablet Commonly known as:  ZANTAC Take one tablet every evening   Tiotropium Bromide Monohydrate 1.25 MCG/ACT Aers Commonly known as:  SPIRIVA RESPIMAT Inhale 2 puffs into the lungs daily.       Past Medical History:  Diagnosis Date  . Asthma   . GERD (gastroesophageal reflux disease)   . H. pylori infection   . Hypercholesteremia   . UTI (lower urinary tract infection)     Past Surgical History:  Procedure Laterality Date  . ABDOMINAL HYSTERECTOMY    . ABDOMINAL SURGERY    . CESAREAN SECTION      Review of systems negative except as noted in HPI / PMHx or noted below:  Review of Systems  Constitutional: Negative.   HENT: Negative.   Eyes: Negative.   Respiratory: Negative.   Cardiovascular: Negative.   Gastrointestinal: Negative.   Genitourinary: Negative.   Musculoskeletal: Negative.   Skin: Negative.   Neurological: Negative.   Endo/Heme/Allergies: Negative.   Psychiatric/Behavioral: Negative.      Objective:   Vitals:   10/18/17 1055  BP: 108/66  Pulse: 64  Resp: 16          Physical Exam  HENT:  Head: Normocephalic.  Right Ear: Tympanic membrane, external ear and ear canal normal.  Left Ear: Tympanic membrane, external ear and ear canal normal.  Nose: Nose normal. No mucosal edema or rhinorrhea.  Mouth/Throat:  Uvula is midline, oropharynx is clear and moist and mucous membranes are normal. No oropharyngeal exudate.  Eyes: Conjunctivae are normal.  Neck: Trachea normal. No tracheal tenderness present. No tracheal deviation present. No thyromegaly present.  Cardiovascular: Normal rate, regular rhythm, S1 normal, S2 normal and normal heart sounds.  No murmur heard. Pulmonary/Chest: Breath sounds normal. No stridor. No respiratory distress. She has no wheezes. She has no rales.  Musculoskeletal: She exhibits no edema.  Lymphadenopathy:       Head (right side): No tonsillar adenopathy present.       Head (left side): No tonsillar adenopathy  present.    She has no cervical adenopathy.  Neurological: She is alert.  Skin: No rash noted. She is not diaphoretic. No erythema. Nails show no clubbing.    Diagnostics:    Spirometry was performed and demonstrated an FEV1 of 1.22 at 80% of predicted.  Results of blood tests obtained 26 September 2017 identified WBC 8.6, absolute eosinophil 1900, absolute lymphocyte 3100, hemoglobin 11.6, platelet 269, IgE antibodies directed against shellfish and fish.  Assessment and Plan:   1. Asthma, severe persistent, well-controlled   2. Other allergic rhinitis   3. LPRD (laryngopharyngeal reflux disease)   4. Seafood allergy     1.  Continue to perform Allergen avoidance measures - No seafood  2.  Continue to Treat and prevent inflammation:   A.  Symbicort 160 - 2 inhalations twice a day with spacer  B.  Spiriva 1.25 Respimat - 2 inhalations 1 time per day  C.  Flonase - 1 spray each nostril one time per day  D.  Fasenra injection every 4 weeks - sample today  3.  Continue to Treat and prevent reflux:   A.  esomeprazole 40 mg in a.m.  B.  Ranitidine 300 mg in p.m.  C.  Attempt to taper off all caffeine consumption  4.  If needed:   A.  Proventil HFA or similar 2 puffs every 4-6 hours  B.  Nasal saline  C.  Epi-Pen  5. Return to clinic in 4 weeks  Samantha [name] is better at this point in time while utilizing a very large collection of medical therapy directed against respiratory tract inflammation and reflux.  We have given her the first injection of an anti-IL 5 biological agent today.  I will see her back in this clinic in 4 weeks to assess her response to this plan and there may be an opportunity to consolidate her treatment if she responds appropriately to her anti-IL 5 biological agent.  I suspect that this will be the case given her rather significant eosinophilia.  [name], MD Allergy / Immunology [facility]

## 2017-10-19 ENCOUNTER — Encounter: Payer: Self-pay | Admitting: Allergy and Immunology

## 2017-10-19 LAB — I-STAT TROPONIN, ED: Troponin i, poc: 0 ng/mL (ref 0.00–0.08)

## 2017-10-19 NOTE — ED Notes (Signed)
Arm band was left at desk. Pt did not talk to anyone before leaving.

## 2017-11-01 DIAGNOSIS — N3010 Interstitial cystitis (chronic) without hematuria: Secondary | ICD-10-CM | POA: Diagnosis not present

## 2017-11-01 DIAGNOSIS — R310 Gross hematuria: Secondary | ICD-10-CM | POA: Diagnosis not present

## 2017-11-01 DIAGNOSIS — R350 Frequency of micturition: Secondary | ICD-10-CM | POA: Diagnosis not present

## 2017-11-14 DIAGNOSIS — N281 Cyst of kidney, acquired: Secondary | ICD-10-CM | POA: Diagnosis not present

## 2017-11-14 DIAGNOSIS — N362 Urethral caruncle: Secondary | ICD-10-CM | POA: Diagnosis not present

## 2017-11-14 DIAGNOSIS — R319 Hematuria, unspecified: Secondary | ICD-10-CM | POA: Diagnosis not present

## 2017-11-14 DIAGNOSIS — R310 Gross hematuria: Secondary | ICD-10-CM | POA: Diagnosis not present

## 2017-11-15 ENCOUNTER — Telehealth: Payer: Self-pay | Admitting: *Deleted

## 2017-11-15 NOTE — Telephone Encounter (Signed)
Patient came in and saw Dr Lucie Leather and received sample 4/17.  I had previously talked to son on 4/2 and mailed paperwork.  I advised [place] nurse to get paperwoork filled out for free drug and advise them we need financial and also called then 4/18.  I again today and called and advised still need financial to proceed and he advised would get same and call me back

## 2017-11-21 ENCOUNTER — Encounter: Payer: Self-pay | Admitting: Allergy and Immunology

## 2017-11-21 ENCOUNTER — Ambulatory Visit (INDEPENDENT_AMBULATORY_CARE_PROVIDER_SITE_OTHER): Payer: Medicare Other | Admitting: Allergy and Immunology

## 2017-11-21 VITALS — BP 126/70 | HR 64 | Resp 16

## 2017-11-21 DIAGNOSIS — J4550 Severe persistent asthma, uncomplicated: Secondary | ICD-10-CM

## 2017-11-21 DIAGNOSIS — K219 Gastro-esophageal reflux disease without esophagitis: Secondary | ICD-10-CM

## 2017-11-21 DIAGNOSIS — Z91013 Allergy to seafood: Secondary | ICD-10-CM

## 2017-11-21 DIAGNOSIS — J3089 Other allergic rhinitis: Secondary | ICD-10-CM | POA: Diagnosis not present

## 2017-11-21 NOTE — Progress Notes (Signed)
Follow-up Note  Referring Provider: Dorothyann Peng, MD Primary Provider: Dorothyann Peng, MD Date of Office Visit: 11/21/2017  Subjective:   Samantha [name] (DOB: 1941-03-01) is a 77 y.o. female who [facility] 11[facility]2017 in re-evaluation of the following:  HPI: Samantha [name] returns to this clinic in reevaluation of her asthma and allergic rhinitis and LPR and food allergy directed against shellfish.  I have not seen her in this clinic since 18 October 2017.  Overall she thinks that she is doing better.  She still has some dyspnea on exertion.  However, her cough has decreased dramatically and her resting shortness of breath is basically gone.  Her requirement for a bronchodilator is about 3 times a week.  She continues on a large collection of medical therapy and has received 1 injection of Fasenra.  Her nose is doing very well at this point in time.  Although she has had improvement regarding her throat clearing she still gets some throat clearing and she has actually developed some raspy voice in the morning since she has been using all of her inhalers.  She still continues to drink 3 cups of coffee per day.  She remains away from consuming seafood.  Allergies as of 11/21/2017      Reactions   Contrast Media [iodinated Diagnostic Agents] Other (See Comments)   Patient breaks out in hives pt has had ct scans in the past with premeds and has not had reaction.   Other Nausea And Vomiting   Seafood allergy-rash      Medication List      albuterol 108 (90 Base) MCG/ACT inhaler Commonly known as:  PROVENTIL HFA;VENTOLIN HFA Inhale 2 puffs into the lungs every 6 (six) hours as needed for wheezing or shortness of breath.   budesonide-formoterol 160-4.5 MCG/ACT inhaler Commonly known as:  SYMBICORT Inhale 2 puffs into the lungs 2 (two) times daily.   EPINEPHrine 0.3 mg/0.3 mL Soaj injection Commonly known as:  EPI-PEN Use as directed for severe allergic  reaction   esomeprazole 40 MG capsule Commonly known as:  NEXIUM   pravastatin 20 MG tablet Commonly known as:  PRAVACHOL Take 20 mg by mouth daily.   ranitidine 300 MG tablet Commonly known as:  ZANTAC Take one tablet every evening   Tiotropium Bromide Monohydrate 1.25 MCG/ACT Aers Commonly known as:  SPIRIVA RESPIMAT Inhale 2 puffs into the lungs daily.       Past Medical History:  Diagnosis Date  . Asthma   . GERD (gastroesophageal reflux disease)   . H. pylori infection   . Hypercholesteremia   . UTI (lower urinary tract infection)     Past Surgical History:  Procedure Laterality Date  . ABDOMINAL HYSTERECTOMY    . ABDOMINAL SURGERY    . CESAREAN SECTION      Review of systems negative except as noted in HPI / PMHx or noted below:  Review of Systems  Constitutional: Negative.   HENT: Negative.   Eyes: Negative.   Respiratory: Negative.   Cardiovascular: Negative.   Gastrointestinal: Negative.   Genitourinary: Negative.   Musculoskeletal: Negative.   Skin: Negative.   Neurological: Negative.   Endo/Heme/Allergies: Negative.   Psychiatric/Behavioral: Negative.      Objective:   Vitals:   11/21/17 1007  BP: 126/70  Pulse: 64  Resp: 16          Physical Exam  HENT:  Head: Normocephalic.  Right Ear: Tympanic membrane, external ear and  ear canal normal.  Left Ear: Tympanic membrane, external ear and ear canal normal.  Nose: Nose normal. No mucosal edema or rhinorrhea.  Mouth/Throat: Uvula is midline, oropharynx is clear and moist and mucous membranes are normal. No oropharyngeal exudate.  Eyes: Conjunctivae are normal.  Neck: Trachea normal. No tracheal tenderness present. No tracheal deviation present. No thyromegaly present.  Cardiovascular: Normal rate, regular rhythm, S1 normal, S2 normal and normal heart sounds.  No murmur heard. Pulmonary/Chest: Breath sounds normal. No stridor. No respiratory distress. She has no wheezes. She has no  rales.  Musculoskeletal: She exhibits no edema.  Lymphadenopathy:       Head (right side): No tonsillar adenopathy present.       Head (left side): No tonsillar adenopathy present.    She has no cervical adenopathy.  Neurological: She is alert.  Skin: No rash noted. She is not diaphoretic. No erythema. Nails show no clubbing.    Diagnostics:    Spirometry was performed and demonstrated an FEV1 of 1.24 at 77 % of predicted.  The patient had an Asthma Control Test with the following results: ACT Total Score: 22.    Assessment and Plan:   1. Asthma, severe persistent, well-controlled   2. Other allergic rhinitis   3. LPRD (laryngopharyngeal reflux disease)   4. Seafood allergy     1.  Continue to perform Allergen avoidance measures - No seafood  2.  Continue to Treat and prevent inflammation:   A.  Symbicort 160 - 2 inhalations twice a day with spacer  B.  DISCONTINUE Spiriva 1.25 Respimat    C.  Flonase - 1 spray each nostril one time per day  D.  Fasenra injection every 4 weeks   3.  Continue to Treat and prevent reflux:   A.  esomeprazole 40 mg in a.m.  B.  Ranitidine 300 mg in p.m.  C.  Attempt to taper off all caffeine consumption  4.  If needed:   A.  Proventil HFA or similar 2 puffs every 4-6 hours  B.  Nasal saline  C.  Epi-Pen  5. Return to clinic in 12 weeks  Samantha [name] appears to be doing better on her current plan.  We will continue her on a large collection of anti-inflammatory agents for her respiratory tract and also continue to have her aggressively treat reflux.  I will make an attempt to consolidate some of her treatment by seeing if she does as well without the use of an anticholinergic inhaler.  I will see her back in his clinic in 12 weeks or earlier if there is a problem.  [name], MD Allergy / Immunology [facility]

## 2017-11-21 NOTE — Patient Instructions (Addendum)
  1.  Continue to perform Allergen avoidance measures - No seafood  2.  Continue to Treat and prevent inflammation:   A.  Symbicort 160 - 2 inhalations twice a day with spacer  B.  DISCONTINUE Spiriva 1.25 Respimat    C.  Flonase - 1 spray each nostril one time per day  D.  Fasenra injection every 4 weeks   3.  Continue to Treat and prevent reflux:   A.  esomeprazole 40 mg in a.m.  B.  Ranitidine 300 mg in p.m.  C.  Attempt to taper off all caffeine consumption  4.  If needed:   A.  Proventil HFA or similar 2 puffs every 4-6 hours  B.  Nasal saline  C.  Epi-Pen  5. Return to clinic in 12 weeks

## 2017-11-22 ENCOUNTER — Encounter: Payer: Self-pay | Admitting: Allergy and Immunology

## 2017-12-06 DIAGNOSIS — J329 Chronic sinusitis, unspecified: Secondary | ICD-10-CM | POA: Diagnosis not present

## 2017-12-06 DIAGNOSIS — R05 Cough: Secondary | ICD-10-CM | POA: Diagnosis not present

## 2017-12-11 ENCOUNTER — Telehealth: Payer: Self-pay | Admitting: *Deleted

## 2017-12-11 NOTE — Telephone Encounter (Signed)
Called son Bethann BerkshireJohnny and explained process and info needed to get patient on drug.  Patient came in and rcvd sample in clinic on 10/18/17 and signed the form needed and [facility] needed.  I again called and spoke to son regarding financial info required to get patient on therapy and he advised would send same.  At this time I have not heard from son and will put patient on inactive until they can get info to me

## 2018-03-28 ENCOUNTER — Other Ambulatory Visit: Payer: Self-pay | Admitting: Gastroenterology

## 2018-03-28 DIAGNOSIS — R1033 Periumbilical pain: Secondary | ICD-10-CM

## 2018-03-29 ENCOUNTER — Ambulatory Visit
Admission: RE | Admit: 2018-03-29 | Discharge: 2018-03-29 | Disposition: A | Discharge status: Home / Self Care | Payer: Medicare Other | Source: Ambulatory Visit | Attending: Gastroenterology | Admitting: Gastroenterology

## 2018-03-29 DIAGNOSIS — R1033 Periumbilical pain: Secondary | ICD-10-CM

## 2018-04-24 ENCOUNTER — Ambulatory Visit (INDEPENDENT_AMBULATORY_CARE_PROVIDER_SITE_OTHER): Payer: Medicare Other | Admitting: Allergy and Immunology

## 2018-04-24 ENCOUNTER — Encounter: Payer: Self-pay | Admitting: Allergy and Immunology

## 2018-04-24 VITALS — BP 110/62 | HR 70 | Resp 18

## 2018-04-24 DIAGNOSIS — J4550 Severe persistent asthma, uncomplicated: Secondary | ICD-10-CM

## 2018-04-24 DIAGNOSIS — J3089 Other allergic rhinitis: Secondary | ICD-10-CM | POA: Diagnosis not present

## 2018-04-24 DIAGNOSIS — K219 Gastro-esophageal reflux disease without esophagitis: Secondary | ICD-10-CM | POA: Diagnosis not present

## 2018-04-24 DIAGNOSIS — Z91013 Allergy to seafood: Secondary | ICD-10-CM | POA: Diagnosis not present

## 2018-04-24 MED ORDER — FLUTICASONE PROPIONATE 50 MCG/ACT NA SUSP: 1.0000 | Freq: Every day | NASAL | 5 refills | Status: DC

## 2018-04-24 MED ORDER — ALBUTEROL SULFATE HFA 108 (90 BASE) MCG/ACT IN AERS: 2.0000 | INHALATION_SPRAY | Freq: Four times a day (QID) | RESPIRATORY_TRACT | 2 refills | Status: DC | PRN

## 2018-04-24 MED ORDER — BUDESONIDE-FORMOTEROL FUMARATE 160-4.5 MCG/ACT IN AERO: 2.0000 | INHALATION_SPRAY | Freq: Two times a day (BID) | RESPIRATORY_TRACT | 5 refills | Status: DC

## 2018-04-24 NOTE — Progress Notes (Signed)
Follow-up Note  Referring Provider: Dorothyann Peng, MD Primary Provider: Dorothyann Peng, MD Date of Office Visit: 04/24/2018  Subjective:   Samantha [name] (DOB: 06/11/1941) is a 77 y.o. female who [facility] 04[facility]/2018 in re-evaluation of the following:  HPI: Samantha [name] presents to this clinic in evaluation of her severe asthma and allergic rhinitis and LPR and food allergy directed against shellfish.  I have not seen her in this clinic since 22 Nov 2017.  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  Overall she thinks that she is doing relatively well.  Her requirement for short acting bronchodilator is less than 1 or 2 times per week.  She can apparently exercise without any difficulty other than the fact that she does get fatigued unrelated to respiratory track symptoms.  She has not required a systemic steroid or antibiotics since being seen in this clinic last.  Her reflux is doing relatively well.  She still continues to have the issue of some throat clearing and some occasional drainage in her throat but overall this has definitely improved while she continues to aggressively treat reflux.  She informs me that she has been having some intermittent pain affecting her epigastric area and apparently she is scheduled to see a surgeon tomorrow concerning this issue.  She remains away from consuming shellfish.  She did receive the flu vaccine this year.  Allergies as of 04/24/2018      Reactions   Contrast Media [iodinated Diagnostic Agents] Other (See Comments)   Patient breaks out in hives pt has had ct scans in the past with premeds  and has not had reaction.   Other Nausea And Vomiting   Seafood allergy-rash      Medication List      albuterol 108 (90 Base) MCG/ACT inhaler Commonly known as:  PROVENTIL HFA;VENTOLIN HFA Inhale 2 puffs into the lungs every 6 (six) hours as needed for wheezing or shortness of breath.   budesonide-formoterol 160-4.5 MCG/ACT inhaler Commonly known as:  SYMBICORT Inhale 2 puffs into the lungs 2 (two) times daily.   EPINEPHrine 0.3 mg/0.3 mL Soaj injection Commonly known as:  EPI-PEN Use as directed for severe allergic reaction   esomeprazole 40 MG capsule Commonly known as:  NEXIUM   pravastatin 20 MG tablet Commonly known as:  PRAVACHOL Take 20 mg by mouth daily.   ranitidine 300 MG tablet Commonly known as:  ZANTAC Take one tablet every evening       Past Medical History:  Diagnosis Date  . Asthma   . GERD (gastroesophageal reflux disease)   . H. pylori infection   . Hypercholesteremia   . UTI (lower urinary tract infection)     Past Surgical History:  Procedure Laterality Date  . ABDOMINAL HYSTERECTOMY    . ABDOMINAL SURGERY    . CESAREAN SECTION      Review of systems negative except as noted in HPI / PMHx or noted below:  Review of Systems  Constitutional: Negative.   HENT: Negative.   Eyes: Negative.   Respiratory: Negative.   Cardiovascular: Negative.   Gastrointestinal: Negative.   Genitourinary: Negative.   Musculoskeletal: Negative.   Skin: Negative.  Neurological: Negative.   Endo/Heme/Allergies: Negative.   Psychiatric/Behavioral: Negative.      Objective:   Vitals:   04/24/18 1016 04/24/18 1019  BP:  110/62  Pulse:  70  Resp: 16 18  SpO2: 98% 96%          Physical Exam  HENT:  Head: Normocephalic.  Right Ear: Tympanic membrane, external ear and ear canal normal.  Left Ear: Tympanic membrane, external ear and ear canal normal.  Nose: Nose normal. No mucosal edema or rhinorrhea.  Mouth/Throat: Uvula is midline,  oropharynx is clear and moist and mucous membranes are normal. No oropharyngeal exudate.  Eyes: Conjunctivae are normal.  Neck: Trachea normal. No tracheal tenderness present. No tracheal deviation present. No thyromegaly present.  Cardiovascular: Normal rate, regular rhythm, S1 normal, S2 normal and normal heart sounds.  No murmur heard. Pulmonary/Chest: Breath sounds normal. No stridor. No respiratory distress. She has no wheezes. She has no rales.  Musculoskeletal: She exhibits no edema.  Lymphadenopathy:       Head (right side): No tonsillar adenopathy present.       Head (left side): No tonsillar adenopathy present.    She has no cervical adenopathy.  Neurological: She is alert.  Skin: No rash noted. She is not diaphoretic. No erythema. Nails show no clubbing.    Diagnostics:    Spirometry was performed and demonstrated an FEV1 of 1.27 at 75 % of predicted.  The patient had an Asthma Control Test with the following results: ACT Total Score: 20.    Assessment and Plan:   1. Asthma, severe persistent, well-controlled   2. Other allergic rhinitis   3. LPRD (laryngopharyngeal reflux disease)   4. Seafood allergy     1.  Continue to perform Allergen avoidance measures - No seafood  2.  Continue to Treat and prevent inflammation:   A.  Symbicort 160 - 2 inhalations twice a day with spacer  B.  DISCONTINUE Spiriva 1.25 Respimat    C.  Flonase - 1 spray each nostril one time per day  3.  Continue to Treat and prevent reflux:   A.  esomeprazole 40 mg in a.m.  B.  Ranitidine 300 mg in p.m.  C.  Attempt to taper off all caffeine consumption  4.  If needed:   A.  Proventil HFA or similar 2 puffs every 4-6 hours  B.  Nasal saline  C.  Epi-Pen  5. Return to clinic in 12 weeks or earlier if problem  Damyah appears to be doing okay on her current plan and we will continue to have her use anti-inflammatory agents for respiratory tract and therapy directed against reflux as noted  above.  I think that she would benefit from a biological agent directed at her tissue eosinophilia to control her respiratory tract disease but we just cannot seem to get that arranged because of a communication issue, an insurance issue, and a logistical issue to have her get approval and delivery of that medication.  I will see her back in this clinic in 12 weeks or earlier if there is a problem.  [name], MD Allergy / Immunology [facility]

## 2018-04-24 NOTE — Patient Instructions (Addendum)
  1.  Continue to perform Allergen avoidance measures - No seafood  2.  Continue to Treat and prevent inflammation:   A.  Symbicort 160 - 2 inhalations twice a day with spacer  B.  Flonase - 1 spray each nostril one time per day  3.  Continue to Treat and prevent reflux:   A.  esomeprazole 40 mg in a.m.  B.  Ranitidine 300 mg in p.m.  C.  Attempt to taper off all caffeine consumption  4.  If needed:   A.  Proventil HFA or similar 2 puffs every 4-6 hours  B.  Nasal saline  C.  Epi-Pen  5. Return to clinic in 12 weeks or earlier if problem

## 2018-04-25 ENCOUNTER — Encounter: Payer: Self-pay | Admitting: Allergy and Immunology

## 2018-05-15 NOTE — Patient Instructions (Signed)
Eloise HarmanHden Wailes  05/15/2018   Your procedure is scheduled on: 05-21-18  [facility]  Report to admitting at      0900  AM    Call this number if you have problems the morning of surgery [phone]    Remember: Do not eat food or drink liquids :After Midnight. BRUSH YOUR TEETH MORNING OF SURGERY AND RINSE YOUR MOUTH OUT, NO CHEWING GUM CANDY OR MINTS.     Take these medicines the morning of surgery with A SIP OF WATER:                                 You may not have any metal on your body including hair pins and              piercings  Do not wear jewelry, make-up, lotions, powders or perfumes, deodorant             Do not wear nail polish.  Do not shave  48 hours prior to surgery.     Do not bring valuables to the hospital. [facility] IS NOT             RESPONSIBLE   FOR VALUABLES.  Contacts, dentures or bridgework may not be worn into surgery.  Leave suitcase in the car. After surgery it may be brought to your room.     Patients discharged the day of surgery will not be allowed to drive home.  Name and phone number of your driver:  Special Instructions: N/A              Please read over the following fact sheets you were given: _____________________________________________________________________           [facility] - Preparing [facility] Before surgery, you can play an important role.  Because skin is not sterile, your skin needs to be as free of germs as possible.  You can reduce the number of germs on your skin by washing with CHG (chlorahexidine gluconate) soap before surgery.  CHG is an antiseptic cleaner which kills germs and bonds with the skin to continue killing germs even after washing. Please DO NOT use if you have an allergy to CHG or antibacterial soaps.  If your skin becomes reddened/irritated stop using the CHG and inform your nurse when you arrive at Short Stay. Do not shave (including legs and underarms) for at  least 48 hours prior to the first CHG shower.  You may shave your face/neck. Please follow these instructions carefully:  1.  Shower with CHG Soap the night before surgery and the  morning of Surgery.  2.  If you choose to wash your hair, wash your hair first as usual with your  normal  shampoo.  3.  After you shampoo, rinse your hair and body thoroughly to remove the  shampoo.                           4.  Use CHG as you would any other liquid soap.  You can apply chg directly  to the skin and wash                       Gently with a scrungie or clean washcloth.  5.  Apply the CHG Soap to your body ONLY FROM THE NECK DOWN.   Do not use on face/ open                           Wound or open sores. Avoid contact with eyes, ears mouth and genitals (private parts).                       Wash face,  Genitals (private parts) with your normal soap.             6.  Wash thoroughly, paying special attention to the area where your surgery  will be performed.  7.  Thoroughly rinse your body with warm water from the neck down.  8.  DO NOT shower/wash with your normal soap after using and rinsing off  the CHG Soap.                9.  Pat yourself dry with a clean towel.            10.  Wear clean pajamas.            11.  Place clean sheets on your bed the night of your first shower and do not  sleep with pets. Day of Surgery : Do not apply any lotions/deodorants the morning of surgery.  Please wear clean clothes to the hospital/surgery center.  FAILURE TO FOLLOW THESE INSTRUCTIONS MAY RESULT IN THE CANCELLATION OF YOUR SURGERY PATIENT SIGNATURE_________________________________  NURSE SIGNATURE__________________________________  ________________________________________________________________________

## 2018-05-15 NOTE — Progress Notes (Signed)
Please place orders in epic pt. Has a preop tomorrow Thank you

## 2018-05-16 ENCOUNTER — Ambulatory Visit: Payer: Self-pay | Admitting: Surgery

## 2018-05-16 ENCOUNTER — Inpatient Hospital Stay (HOSPITAL_COMMUNITY)
Admission: RE | Admit: 2018-05-16 | Discharge: 2018-05-16 | Disposition: A | Discharge status: Home / Self Care | Payer: Medicare Other | Source: Ambulatory Visit

## 2018-05-16 NOTE — Progress Notes (Signed)
ekg 10-19-17 epic cxr 10-18-17 epic

## 2018-05-17 ENCOUNTER — Ambulatory Visit (INDEPENDENT_AMBULATORY_CARE_PROVIDER_SITE_OTHER): Payer: Medicare Other

## 2018-05-17 ENCOUNTER — Encounter: Payer: Self-pay | Admitting: Internal Medicine

## 2018-05-17 ENCOUNTER — Ambulatory Visit (INDEPENDENT_AMBULATORY_CARE_PROVIDER_SITE_OTHER): Payer: Medicare Other | Admitting: Internal Medicine

## 2018-05-17 VITALS — BP 130/76 | HR 68 | Temp 97.8°F | Ht 59.5 in | Wt 118.6 lb

## 2018-05-17 VITALS — BP 130/74 | HR 68 | Temp 97.8°F | Ht 59.5 in | Wt 118.6 lb

## 2018-05-17 DIAGNOSIS — Z0000 Encounter for general adult medical examination without abnormal findings: Secondary | ICD-10-CM

## 2018-05-17 DIAGNOSIS — R5383 Other fatigue: Secondary | ICD-10-CM | POA: Diagnosis not present

## 2018-05-17 DIAGNOSIS — Z1211 Encounter for screening for malignant neoplasm of colon: Secondary | ICD-10-CM | POA: Diagnosis not present

## 2018-05-17 DIAGNOSIS — E2839 Other primary ovarian failure: Secondary | ICD-10-CM

## 2018-05-17 DIAGNOSIS — K8080 Other cholelithiasis without obstruction: Secondary | ICD-10-CM | POA: Diagnosis not present

## 2018-05-17 DIAGNOSIS — L299 Pruritus, unspecified: Secondary | ICD-10-CM

## 2018-05-17 DIAGNOSIS — R7309 Other abnormal glucose: Secondary | ICD-10-CM | POA: Diagnosis not present

## 2018-05-17 DIAGNOSIS — Z0001 Encounter for general adult medical examination with abnormal findings: Secondary | ICD-10-CM

## 2018-05-17 DIAGNOSIS — Z13220 Encounter for screening for lipoid disorders: Secondary | ICD-10-CM | POA: Diagnosis not present

## 2018-05-17 NOTE — Progress Notes (Signed)
Subjective:     Patient ID: Samantha [name] , female    DOB: January 05, 1941 , 77 y.o.   MRN: [id number]   Chief Complaint  Patient presents with  . Annual Exam    HPI Pt is here for a physical. She was supposed to have choly surgery  This week because but he missed it since her husband misunderstoold the message. She has a GYN she sees yearly 1- Has been having L mid back itching  And feels as when she had a tick bite and was in the hospital in 2016 for that, but not sure if she had [place] or [place]. Tick bite was 2017 and had leg rash and was given treatment. Denies any recent bites.  2- Fatigue x 2 weeks. She is sleeping fine at night time. Has not had to take naps. She has not been taking her vit D.   Past Medical History:  Diagnosis Date  . Asthma   . GERD (gastroesophageal reflux disease)   . H. pylori infection   . Hypercholesteremia   . UTI (lower urinary tract infection)      Family History  Problem Relation Age of Onset  . Cancer Mother   . Asthma Father   . Allergic rhinitis Father   . Food Allergy Father   . Urticaria Neg Hx   . Eczema Neg Hx   . Angioedema Neg Hx      Current Outpatient Medications:  .  albuterol (PROVENTIL HFA;VENTOLIN HFA) 108 (90 Base) MCG/ACT inhaler, Inhale 2 puffs into the lungs every 6 (six) hours as needed for wheezing or shortness of breath., Disp: 1 Inhaler, Rfl: 2 .  budesonide-formoterol (SYMBICORT) 160-4.5 MCG/ACT inhaler, Inhale 2 puffs into the lungs 2 (two) times daily., Disp: 1 Inhaler, Rfl: 5 .  EPINEPHrine 0.3 mg/0.3 mL IJ SOAJ injection, Use as directed for severe allergic reaction, Disp: 2 Device, Rfl: 2 .  esomeprazole (NEXIUM) 40 MG capsule, , Disp: , Rfl: 0 .  fluticasone (FLONASE) 50 MCG/ACT nasal spray, Place 1 spray into both nostrils daily., Disp: 16 g, Rfl: 5 .  imipramine (TOFRANIL) 50 MG tablet, Take 50 mg by mouth at bedtime., Disp: , Rfl:  .  omeprazole (PRILOSEC) 20 MG capsule, Take 20 mg by mouth daily., Disp: , Rfl:  .   pravastatin (PRAVACHOL) 20 MG tablet, Take 20 mg by mouth daily., Disp: , Rfl:    Allergies  Allergen Reactions  . Contrast Media [Iodinated Diagnostic Agents] Other (See Comments)    Patient breaks out in hives pt has had ct scans in the past with premeds and has not had reaction.  . Other Nausea And Vomiting, Rash and Other (See Comments)    Seafood allergy     Review of Systems  Constitutional: Positive for fatigue. Negative for activity change, appetite change, chills, diaphoresis, fever and unexpected weight change.  HENT: Negative.   Eyes: Negative for discharge, itching and visual disturbance.  Respiratory: Negative for cough, chest tightness and shortness of breath.   Cardiovascular: Negative for chest pain, palpitations and leg swelling.  Gastrointestinal: Positive for abdominal pain and nausea. Negative for abdominal distention, blood in stool, constipation, diarrhea and vomiting.  Endocrine: Negative for cold intolerance, heat intolerance, polydipsia, polyphagia and polyuria.  Genitourinary: Negative for dysuria, frequency, pelvic pain, urgency and vaginal bleeding.  Musculoskeletal: Negative.   Skin: Negative for color change, pallor, rash and wound.  Allergic/Immunologic: Negative for environmental allergies and food allergies.  Neurological: Negative for weakness, light-headedness, numbness and  headaches.  Hematological: Negative for adenopathy. Does not bruise/bleed easily.  Psychiatric/Behavioral: Negative for agitation.     Today's Vitals   05/17/18 1037  BP: 130/76  Pulse: 68  Temp: 97.8 F (36.6 C)  TempSrc: Oral  SpO2: 99%  Weight: 118 lb 9.6 oz (53.8 kg)  Height: 4' 11.5" (1.511 m)   Body mass index is 23.55 kg/m.   Objective:  Physical Exam  BP 130/76 (BP Location: Left Arm, Patient Position: Sitting, Cuff Size: Normal)   Pulse 68   Temp 97.8 F (36.6 C) (Oral)   Ht 4' 11.5" (1.511 m)   Wt 118 lb 9.6 oz (53.8 kg)   SpO2 99%   BMI 23.55 kg/m    General Appearance:    Alert, cooperative, no distress, appears stated age  Head:    Normocephalic, without obvious abnormality, atraumatic  Eyes:    PERRL, conjunctiva/corneas clear, EOM's intact, fundi    benign, both eyes  Ears:    Normal TM's and external ear canals, both ears  Nose:   Nares normal, septum midline, mucosa normal, no drainage    or sinus tenderness  Throat:   Lips, mucosa, and tongue normal; teeth and gums normal  Neck:   Supple, symmetrical, trachea midline, no adenopathy;    thyroid:  no enlargement/tenderness/nodules; no carotid   bruit or JVD  Back:     Symmetric, no curvature, ROM normal, no CVA tenderness  Lungs:     Clear to auscultation bilaterally, respirations unlabored  Chest Wall:    No tenderness or deformity   Heart:    Regular rate and rhythm, S1 and S2 normal, no murmur, rub   or gallop  Breast Exam:    No tenderness, masses, or nipple abnormality  Abdomen:     Soft, non-tender, bowel sounds active all four quadrants,    no masses, no organomegaly     Rectal:    Normal tone, normal prostate, no masses or tenderness;   guaiac negative stool  Extremities:   Extremities normal, atraumatic, no cyanosis or edema  Pulses:   2+ and symmetric all extremities  Skin:   Skin color, texture, turgor normal, no rashes or lesions. No signs of skin bites or abnormal moles.   Lymph nodes:   Cervical, supraclavicular, and axillary nodes normal  Neurologic:   CNII-XII intact, normal strength, sensation and reflexes    throughout      Assessment And Plan:   1. Screening for lipoid disorders - Lipid panel    FU 6 months 2. Decreased estrogen level- chronic - DEXAScan; Future which is due 06/2018  3. Other fatigue- acute - CBC - Comprehensive metabolic panel - TSH - T3, free - T4, Free  4. Encounter for general adult medical examination with abnormal findings- Routine. FU 1 y  5. Abnormal glucose- unknown status.  - Hemoglobin A1c  6. Itching- L back  which improved after applying DeepBlue cream on it. Advised to watch out for a rash in case these is prodrome of shingels.   7. Biliary calculus of other site without obstruction- I called the pre-op dep to re-schedule her apt. But I had to leave a message to call her son. I wrote the information pt needs on her AVS.     Davian Wollenberg RODRIGUEZ-SOUTHWORTH, PA-C

## 2018-05-17 NOTE — Patient Instructions (Addendum)
Pre-op visit needs to be scheduled for yesterday, but they did not answer the phone to re-schedule it. Call them at [phone]  I left a message for they to call her son.    YOUR SURGERY IS 05/21/18 AT [facility] AND NEED TO BE THERE AT  9 AM    Preventive Care 9 Years and Older, Female Preventive care refers to lifestyle choices and visits with your health care provider that can promote health and wellness. What does preventive care include?  A yearly physical exam. This is also called an annual well check.  Dental exams once or twice a year.  Routine eye exams. Ask your health care provider how often you should have your eyes checked.  Personal lifestyle choices, including: ? Daily care of your teeth and gums. ? Regular physical activity. ? Eating a healthy diet. ? Avoiding tobacco and drug use. ? Limiting alcohol use. ? Practicing safe sex. ? Taking low-dose aspirin every day. ? Taking vitamin and mineral supplements as recommended by your health care provider. What happens during an annual well check? The services and screenings done by your health care provider during your annual well check will depend on your age, overall health, lifestyle risk factors, and family history of disease. Counseling Your health care provider may ask you questions about your:  Alcohol use.  Tobacco use.  Drug use.  Emotional well-being.  Home and relationship well-being.  Sexual activity.  Eating habits.  History of falls.  Memory and ability to understand (cognition).  Work and work Statistician.  Reproductive health.  Screening You may have the following tests or measurements:  Height, weight, and BMI.  Blood pressure.  Lipid and cholesterol levels. These may be checked every 5 years, or more frequently if you are over 82 years old.  Skin check.  Lung cancer screening. You may have this screening every year starting at age 70 if you have a  30-pack-year history of smoking and currently smoke or have quit within the past 15 years.  Fecal occult blood test (FOBT) of the stool. You may have this test every year starting at age 64.  Flexible sigmoidoscopy or colonoscopy. You may have a sigmoidoscopy every 5 years or a colonoscopy every 10 years starting at age 61.  Hepatitis C blood test.  Hepatitis B blood test.  Sexually transmitted disease (STD) testing.  Diabetes screening. This is done by checking your blood sugar (glucose) after you have not eaten for a while (fasting). You may have this done every 1-3 years.  Bone density scan. This is done to screen for osteoporosis. You may have this done starting at age 60.  Mammogram. This may be done every 1-2 years. Talk to your health care provider about how often you should have regular mammograms.  Talk with your health care provider about your test results, treatment options, and if necessary, the need for more tests. Vaccines Your health care provider may recommend certain vaccines, such as:  Influenza vaccine. This is recommended every year.  Tetanus, diphtheria, and acellular pertussis (Tdap, Td) vaccine. You may need a Td booster every 10 years.  Varicella vaccine. You may need this if you have not been vaccinated.  Zoster vaccine. You may need this after age 34.  Measles, mumps, and rubella (MMR) vaccine. You may need at least one dose of MMR if you were born in 1957 or later. You may also need a second dose.  Pneumococcal 13-valent conjugate (PCV13) vaccine. One dose  is recommended after age 41.  Pneumococcal polysaccharide (PPSV23) vaccine. One dose is recommended after age 68.  Meningococcal vaccine. You may need this if you have certain conditions.  Hepatitis A vaccine. You may need this if you have certain conditions or if you travel or work in places where you may be exposed to hepatitis A.  Hepatitis B vaccine. You may need this if you have certain  conditions or if you travel or work in places where you may be exposed to hepatitis B.  Haemophilus influenzae type b (Hib) vaccine. You may need this if you have certain conditions.  Talk to your health care provider about which screenings and vaccines you need and how often you need them. This information is not intended to replace advice given to you by your health care provider. Make sure you discuss any questions you have with your health care provider. Document Released: 07/17/2015 Document Revised: 03/09/2016 Document Reviewed: 04/21/2015 Elsevier Interactive Patient Education  2018 [place] Maintenance, Female Adopting a healthy lifestyle and getting preventive care can go a long way to promote health and wellness. Talk with your health care provider about what schedule of regular examinations is right for you. This is a good chance for you to check in with your provider about disease prevention and staying healthy. In between checkups, there are plenty of things you can do on your own. Experts have done a lot of research about which lifestyle changes and preventive measures are most likely to keep you healthy. Ask your health care provider for more information. Weight and diet Eat a healthy diet  Be sure to include plenty of vegetables, fruits, low-fat dairy products, and lean protein.  Do not eat a lot of foods high in solid fats, added sugars, or salt.  Get regular exercise. This is one of the most important things you can do for your health. ? Most adults should exercise for at least 150 minutes each week. The exercise should increase your heart rate and make you sweat (moderate-intensity exercise). ? Most adults should also do strengthening exercises at least twice a week. This is in addition to the moderate-intensity exercise.  Maintain a healthy weight  Body mass index (BMI) is a measurement that can be used to identify possible weight problems. It estimates body  fat based on height and weight. Your health care provider can help determine your BMI and help you achieve or maintain a healthy weight.  For females 44 years of age and older: ? A BMI below 18.5 is considered underweight. ? A BMI of 18.5 to 24.9 is normal. ? A BMI of 25 to 29.9 is considered overweight. ? A BMI of 30 and above is considered obese.  Watch levels of cholesterol and blood lipids  You should start having your blood tested for lipids and cholesterol at 77 years of age, then have this test every 5 years.  You may need to have your cholesterol levels checked more often if: ? Your lipid or cholesterol levels are high. ? You are older than 77 years of age. ? You are at high risk for heart disease.  Cancer screening Lung Cancer  Lung cancer screening is recommended for adults 52-63 years old who are at high risk for lung cancer because of a history of smoking.  A yearly low-dose CT scan of the lungs is recommended for people who: ? Currently smoke. ? Have quit within the past 15 years. ? Have at least a 30-pack-year  history of smoking. A pack year is smoking an average of one pack of cigarettes a day for 1 year.  Yearly screening should continue until it has been 15 years since you quit.  Yearly screening should stop if you develop a health problem that would prevent you from having lung cancer treatment.  Breast Cancer  Practice breast self-awareness. This means understanding how your breasts normally appear and feel.  It also means doing regular breast self-exams. Let your health care provider know about any changes, no matter how small.  If you are in your 20s or 30s, you should have a clinical breast exam (CBE) by a health care provider every 1-3 years as part of a regular health exam.  If you are 76 or older, have a CBE every year. Also consider having a breast X-ray (mammogram) every year.  If you have a family history of breast cancer, talk to your health care  provider about genetic screening.  If you are at high risk for breast cancer, talk to your health care provider about having an MRI and a mammogram every year.  Breast cancer gene (BRCA) assessment is recommended for women who have family members with BRCA-related cancers. BRCA-related cancers include: ? Breast. ? Ovarian. ? Tubal. ? Peritoneal cancers.  Results of the assessment will determine the need for genetic counseling and BRCA1 and BRCA2 testing.  Cervical Cancer Your health care provider may recommend that you be screened regularly for cancer of the pelvic organs (ovaries, uterus, and vagina). This screening involves a pelvic examination, including checking for microscopic changes to the surface of your cervix (Pap test). You may be encouraged to have this screening done every 3 years, beginning at age 56.  For women ages 47-65, health care providers may recommend pelvic exams and Pap testing every 3 years, or they may recommend the Pap and pelvic exam, combined with testing for human papilloma virus (HPV), every 5 years. Some types of HPV increase your risk of cervical cancer. Testing for HPV may also be done on women of any age with unclear Pap test results.  Other health care providers may not recommend any screening for nonpregnant women who are considered low risk for pelvic cancer and who do not have symptoms. Ask your health care provider if a screening pelvic exam is right for you.  If you have had past treatment for cervical cancer or a condition that could lead to cancer, you need Pap tests and screening for cancer for at least 20 years after your treatment. If Pap tests have been discontinued, your risk factors (such as having a new sexual partner) need to be reassessed to determine if screening should resume. Some women have medical problems that increase the chance of getting cervical cancer. In these cases, your health care provider may recommend more frequent screening and  Pap tests.  Colorectal Cancer  This type of cancer can be detected and often prevented.  Routine colorectal cancer screening usually begins at 77 years of age and continues through 77 years of age.  Your health care provider may recommend screening at an earlier age if you have risk factors for colon cancer.  Your health care provider may also recommend using home test kits to check for hidden blood in the stool.  A small camera at the end of a tube can be used to examine your colon directly (sigmoidoscopy or colonoscopy). This is done to check for the earliest forms of colorectal cancer.  Routine screening usually  begins at age 63.  Direct examination of the colon should be repeated every 5-10 years through 77 years of age. However, you may need to be screened more often if early forms of precancerous polyps or small growths are found.  Skin Cancer  Check your skin from head to toe regularly.  Tell your health care provider about any new moles or changes in moles, especially if there is a change in a mole's shape or color.  Also tell your health care provider if you have a mole that is larger than the size of a pencil eraser.  Always use sunscreen. Apply sunscreen liberally and repeatedly throughout the day.  Protect yourself by wearing long sleeves, pants, a wide-brimmed hat, and sunglasses whenever you are outside.  Heart disease, diabetes, and high blood pressure  High blood pressure causes heart disease and increases the risk of stroke. High blood pressure is more likely to develop in: ? People who have blood pressure in the high end of the normal range (130-139/85-89 mm Hg). ? People who are overweight or obese. ? People who are African American.  If you are 17-3 years of age, have your blood pressure checked every 3-5 years. If you are 67 years of age or older, have your blood pressure checked every year. You should have your blood pressure measured twice-once when you  are at a hospital or clinic, and once when you are not at a hospital or clinic. Record the average of the two measurements. To check your blood pressure when you are not at a hospital or clinic, you can use: ? An automated blood pressure machine at a pharmacy. ? A home blood pressure monitor.  If you are between 44 years and 12 years old, ask your health care provider if you should take aspirin to prevent strokes.  Have regular diabetes screenings. This involves taking a blood sample to check your fasting blood sugar level. ? If you are at a normal weight and have a low risk for diabetes, have this test once every three years after 77 years of age. ? If you are overweight and have a high risk for diabetes, consider being tested at a younger age or more often. Preventing infection Hepatitis B  If you have a higher risk for hepatitis B, you should be screened for this virus. You are considered at high risk for hepatitis B if: ? You were born in a country where hepatitis B is common. Ask your health care provider which countries are considered high risk. ? Your parents were born in a high-risk country, and you have not been immunized against hepatitis B (hepatitis B vaccine). ? You have HIV or AIDS. ? You use needles to inject street drugs. ? You live with someone who has hepatitis B. ? You have had sex with someone who has hepatitis B. ? You get hemodialysis treatment. ? You take certain medicines for conditions, including cancer, organ transplantation, and autoimmune conditions.  Hepatitis C  Blood testing is recommended for: ? Everyone born from [birth year] through 1965. ? Anyone with known risk factors for hepatitis C.  Sexually transmitted infections (STIs)  You should be screened for sexually transmitted infections (STIs) including gonorrhea and chlamydia if: ? You are sexually active and are younger than 77 years of age. ? You are older than 77 years of age and your health care provider  tells you that you are at risk for this type of infection. ? Your sexual activity has changed since  you were last screened and you are at an increased risk for chlamydia or gonorrhea. Ask your health care provider if you are at risk.  If you do not have HIV, but are at risk, it may be recommended that you take a prescription medicine daily to prevent HIV infection. This is called pre-exposure prophylaxis (PrEP). You are considered at risk if: ? You are sexually active and do not regularly use condoms or know the HIV status of your partner(s). ? You take drugs by injection. ? You are sexually active with a partner who has HIV.  Talk with your health care provider about whether you are at high risk of being infected with HIV. If you choose to begin PrEP, you should first be tested for HIV. You should then be tested every 3 months for as long as you are taking PrEP. Pregnancy  If you are premenopausal and you may become pregnant, ask your health care provider about preconception counseling.  If you may become pregnant, take 400 to 800 micrograms (mcg) of folic acid every day.  If you want to prevent pregnancy, talk to your health care provider about birth control (contraception). Osteoporosis and menopause  Osteoporosis is a disease in which the bones lose minerals and strength with aging. This can result in serious bone fractures. Your risk for osteoporosis can be identified using a bone density scan.  If you are 59 years of age or older, or if you are at risk for osteoporosis and fractures, ask your health care provider if you should be screened.  Ask your health care provider whether you should take a calcium or vitamin D supplement to lower your risk for osteoporosis.  Menopause may have certain physical symptoms and risks.  Hormone replacement therapy may reduce some of these symptoms and risks. Talk to your health care provider about whether hormone replacement therapy is right for  you. Follow these instructions at home:  Schedule regular health, dental, and eye exams.  Stay current with your immunizations.  Do not use any tobacco products including cigarettes, chewing tobacco, or electronic cigarettes.  If you are pregnant, do not drink alcohol.  If you are breastfeeding, limit how much and how often you drink alcohol.  Limit alcohol intake to no more than 1 drink per day for nonpregnant women. One drink equals 12 ounces of beer, 5 ounces of wine, or 1 ounces of hard liquor.  Do not use street drugs.  Do not share needles.  Ask your health care provider for help if you need support or information about quitting drugs.  Tell your health care provider if you often feel depressed.  Tell your health care provider if you have ever been abused or do not feel safe at home. This information is not intended to replace advice given to you by your health care provider. Make sure you discuss any questions you have with your health care provider. Document Released: 01/03/2011 Document Revised: 11/26/2015 Document Reviewed: 03/24/2015 Elsevier Interactive Patient Education  [employer].

## 2018-05-17 NOTE — Progress Notes (Signed)
Subjective:   Samantha [name] is a 77 y.o. female who presents for Medicare Annual (Subsequent) preventive examination.  Review of Systems:  N/a  Cardiac Risk Factors include: advanced age (>48men, >4 women);dyslipidemia      Objective:     Vitals: BP 130/74 (BP Location: Right Arm)   Pulse 68   Temp 97.8 F (36.6 C)   Ht 4' 11.5" (1.511 m)   Wt 118 lb 9.6 oz (53.8 kg)   SpO2 99%   BMI 23.55 kg/m   Body mass index is 23.55 kg/m.  Advanced Directives 05/17/2018 05/24/2017 05/01/2015 10/26/2014 12/22/2012 12/19/2012  Does Patient Have a Medical Advance Directive? No No No No - Patient does not have advance directive  Would patient like information on creating a medical advance directive? No - Patient declined - No - patient declined information No - patient declined information - -  Pre-existing out of facility DNR order (yellow form or pink MOST form) - - - - No -    Tobacco Social History   Tobacco Use  Smoking Status Never Smoker  Smokeless Tobacco Never Used     Counseling given: Not Answered   Clinical Intake:  Pre-visit preparation completed: Yes  Pain : 0-10 Pain Score: 8  Pain Type: Acute pain Pain Location: Back(she thinks it is from a tick bite? from past) Pain Orientation: Left Pain Descriptors / Indicators: (feels like biting every day) Pain Onset: More than a month ago Pain Frequency: Intermittent Pain Relieving Factors: used cream in past. feels like itching on skin pain inside Effect of Pain on Daily Activities: no effect at this time. Feels a constant nibbling and feels itching  Pain Relieving Factors: used cream in past. feels like itching on skin pain inside  Nutritional Status: BMI of 19-24  Normal Nutritional Risks: Nausea/ vomitting/ diarrhea(nausea in past 2 weeks. better today) Diabetes: No  How often do you need to have someone help you when you read instructions, pamphlets, or other written materials from your doctor or pharmacy?: 5 -  Always(some interprets) What is the last grade level you completed in school?: no education  Interpreter Needed?: Yes Interpreter Agency: Language resources Interpreter Name: H'lus Ksor Patient Declined Interpreter : No  Information entered by :: NAllen LPN  Past Medical History:  Diagnosis Date  . Asthma   . GERD (gastroesophageal reflux disease)   . H. pylori infection   . Hypercholesteremia   . UTI (lower urinary tract infection)    Past Surgical History:  Procedure Laterality Date  . ABDOMINAL HYSTERECTOMY    . ABDOMINAL SURGERY    . CESAREAN SECTION     Family History  Problem Relation Age of Onset  . Cancer Mother   . Asthma Father   . Allergic rhinitis Father   . Food Allergy Father   . Urticaria Neg Hx   . Eczema Neg Hx   . Angioedema Neg Hx    Social History   Socioeconomic History  . Marital status: Married    Spouse name: Not on file  . Number of children: Not on file  . Years of education: Not on file  . Highest education level: Not on file  Occupational History  . Occupation: retired  Engineer, production  . Financial resource strain: Not hard at all  . Food insecurity:    Worry: Never true    Inability: Never true  . Transportation needs:    Medical: No    Non-medical: No  Tobacco Use  .  Smoking status: Never Smoker  . Smokeless tobacco: Never Used  Substance and Sexual Activity  . Alcohol use: No    Alcohol/week: 0.0 standard drinks  . Drug use: No  . Sexual activity: Not Currently  Lifestyle  . Physical activity:    Days per week: 1 day    Minutes per session: 60 min  . Stress: To some extent  Relationships  . Social connections:    Talks on phone: Not on file    Gets together: Not on file    Attends religious service: Not on file    Active member of club or organization: Not on file    Attends meetings of clubs or organizations: Not on file    Relationship status: Not on file  Other Topics Concern  . Not on file  Social History  Narrative  . Not on file    Outpatient Encounter Medications as of 05/17/2018  Medication Sig  . albuterol (PROVENTIL HFA;VENTOLIN HFA) 108 (90 Base) MCG/ACT inhaler Inhale 2 puffs into the lungs every 6 (six) hours as needed for wheezing or shortness of breath.  . budesonide-formoterol (SYMBICORT) 160-4.5 MCG/ACT inhaler Inhale 2 puffs into the lungs 2 (two) times daily.  Marland Kitchen. EPINEPHrine 0.3 mg/0.3 mL IJ SOAJ injection Use as directed for severe allergic reaction  . fluticasone (FLONASE) 50 MCG/ACT nasal spray Place 1 spray into both nostrils daily.  Marland Kitchen. imipramine (TOFRANIL) 50 MG tablet Take 50 mg by mouth at bedtime.  Marland Kitchen. omeprazole (PRILOSEC) 20 MG capsule Take 20 mg by mouth daily.  . pravastatin (PRAVACHOL) 20 MG tablet Take 20 mg by mouth daily.  Marland Kitchen. esomeprazole (NEXIUM) 40 MG capsule   . [DISCONTINUED] ranitidine (ZANTAC) 300 MG tablet Take one tablet every evening (Patient not taking: Reported on 05/17/2018)   No facility-administered encounter medications on file as of 05/17/2018.     Activities of Daily Living In your present state of health, do you have any difficulty performing the following activities: 05/17/2018  Hearing? N  Vision? N  Difficulty concentrating or making decisions? N  Dressing or bathing? N  Doing errands, shopping? N  Preparing Food and eating ? N  Using the Toilet? N  In the past six months, have you accidently leaked urine? N  Do you have problems with loss of bowel control? N  Managing your Medications? N  Managing your Finances? N  Housekeeping or managing your Housekeeping? N  Some recent data might be hidden    Patient Care Team: Dorothyann PengSanders, Robyn, MD as PCP - General (Internal Medicine)    Assessment:   This is a routine wellness examination for Samantha [name].  Exercise Activities and Dietary recommendations Current Exercise Habits: The patient does not participate in regular exercise at present, Exercise limited by: None identified  Goals    . DIET  - INCREASE WATER INTAKE (pt-stated)     Wants to be in good health. Drink plenty of water and eat healthy food.       Fall Risk Fall Risk  05/17/2018  Falls in the past year? 0   Is the patient's home free of loose throw rugs in walkways, pet beds, electrical cords, etc?   yes      Grab bars in the bathroom? no      Handrails on the stairs?   yes      Adequate lighting?   yes  Timed Get Up and Go performed: n/a  Depression Screen PHQ 2/9 Scores 05/17/2018  PHQ - 2 Score 0  Cognitive Function     6CIT Screen 05/17/2018  What Year? 0 points  What month? 0 points  What time? 0 points  Count back from 20 0 points  Months in reverse 0 points  Repeat phrase 8 points  Total Score 8    Immunization History  Administered Date(s) Administered  . Pneumococcal-Unspecified 12/19/2005  . Tdap 05/24/2017    Qualifies for Shingles Vaccine? yes  Screening Tests Health Maintenance  Topic Date Due  . PNA vac Low Risk Adult (1 of 2 - PCV13) 12/20/2006  . TETANUS/TDAP  05/25/2027  . INFLUENZA VACCINE  Completed  . DEXA SCAN  Completed    Cancer Screenings: Lung: Low Dose CT Chest recommended if Age 58-80 years, 30 pack-year currently smoking OR have quit w/in 15years. Patient does not qualify. Breast:  Up to date on Mammogram? Yes   Up to date of Bone Density/Dexa? Yes Colorectal: not required  Additional Screenings: : Hepatitis C Screening: n/a     Plan:    Patient has lower back pain that has been for a month.  PCP to address during visit   I have personally reviewed and noted the following in the patient's chart:   . Medical and social history . Use of alcohol, tobacco or illicit drugs  . Current medications and supplements . Functional ability and status . Nutritional status . Physical activity . Advanced directives . List of other physicians . Hospitalizations, surgeries, and ER visits in previous 12 months . Vitals . Screenings to include cognitive,  depression, and falls . Referrals and appointments  In addition, I have reviewed and discussed with patient certain preventive protocols, quality metrics, and best practice recommendations. A written personalized care plan for preventive services as well as general preventive health recommendations were provided to patient.     Barb Merino, LPN  [id number]

## 2018-05-17 NOTE — Patient Instructions (Signed)
[name] , Thank you for taking time to come for your Medicare Wellness Visit. I appreciate your ongoing commitment to your health goals. Please review the following plan we discussed and let me know if I can assist you in the future.    Screening recommendations/referrals: Colonoscopy: not required Mammogram: not required Bone Density: 05/2016 Recommended yearly ophthalmology/optometry visit for glaucoma screening and checkup Recommended yearly dental visit for hygiene and checkup  Vaccinations: Influenza vaccine: 04/2018 Pneumococcal vaccine: 05/2014 Tdap vaccine: 03/2015 Shingles vaccine: decl    Advanced directives: Advance directive discussed with you today. Even though you declined this today please call our office should you change your mind and we can give you the proper paperwork for you to fill out.   Conditions/risks identified: lower back pain for month. PCP made aware. Will discuss during visit.  Next appointment:    Preventive Care 665 Years and Older, Female Preventive care refers to lifestyle choices and visits with your health care provider that can promote health and wellness. What does preventive care include?  A yearly physical exam. This is also called an annual well check.  Dental exams once or twice a year.  Routine eye exams. Ask your health care provider how often you should have your eyes checked.  Personal lifestyle choices, including:  Daily care of your teeth and gums.  Regular physical activity.  Eating a healthy diet.  Avoiding tobacco and drug use.  Limiting alcohol use.  Practicing safe sex.  Taking low-dose aspirin every day.  Taking vitamin and mineral supplements as recommended by your health care provider. What happens during an annual well check? The services and screenings done by your health care provider during your annual well check will depend on your age, overall health, lifestyle risk factors, and family history of  disease. Counseling  Your health care provider may ask you questions about your:  Alcohol use.  Tobacco use.  Drug use.  Emotional well-being.  Home and relationship well-being.  Sexual activity.  Eating habits.  History of falls.  Memory and ability to understand (cognition).  Work and work Astronomerenvironment.  Reproductive health. Screening  You may have the following tests or measurements:  Height, weight, and BMI.  Blood pressure.  Lipid and cholesterol levels. These may be checked every 5 years, or more frequently if you are over 77 years old.  Skin check.  Lung cancer screening. You may have this screening every year starting at age 355 if you have a 30-pack-year history of smoking and currently smoke or have quit within the past 15 years.  Fecal occult blood test (FOBT) of the stool. You may have this test every year starting at age 77.  Flexible sigmoidoscopy or colonoscopy. You may have a sigmoidoscopy every 5 years or a colonoscopy every 10 years starting at age 77.  Hepatitis C blood test.  Hepatitis B blood test.  Sexually transmitted disease (STD) testing.  Diabetes screening. This is done by checking your blood sugar (glucose) after you have not eaten for a while (fasting). You may have this done every 1-3 years.  Bone density scan. This is done to screen for osteoporosis. You may have this done starting at age 77.  Mammogram. This may be done every 1-2 years. Talk to your health care provider about how often you should have regular mammograms. Talk with your health care provider about your test results, treatment options, and if necessary, the need for more tests. Vaccines  Your health care provider may  recommend certain vaccines, such as:  Influenza vaccine. This is recommended every year.  Tetanus, diphtheria, and acellular pertussis (Tdap, Td) vaccine. You may need a Td booster every 10 years.  Zoster vaccine. You may need this after age  6.  Pneumococcal 13-valent conjugate (PCV13) vaccine. One dose is recommended after age 52.  Pneumococcal polysaccharide (PPSV23) vaccine. One dose is recommended after age 79. Talk to your health care provider about which screenings and vaccines you need and how often you need them. This information is not intended to replace advice given to you by your health care provider. Make sure you discuss any questions you have with your health care provider. Document Released: 07/17/2015 Document Revised: 03/09/2016 Document Reviewed: 04/21/2015 Elsevier Interactive Patient Education  2017 [place] Prevention in the Home Falls can cause injuries. They can happen to people of all ages. There are many things you can do to make your home safe and to help prevent falls. What can I do on the outside of my home?  Regularly fix the edges of walkways and driveways and fix any cracks.  Remove anything that might make you trip as you walk through a door, such as a raised step or threshold.  Trim any bushes or trees on the path to your home.  Use bright outdoor lighting.  Clear any walking paths of anything that might make someone trip, such as rocks or tools.  Regularly check to see if handrails are loose or broken. Make sure that both sides of any steps have handrails.  Any raised decks and porches should have guardrails on the edges.  Have any leaves, snow, or ice cleared regularly.  Use sand or salt on walking paths during winter.  Clean up any spills in your garage right away. This includes oil or grease spills. What can I do in the bathroom?  Use night lights.  Install grab bars by the toilet and in the tub and shower. Do not use towel bars as grab bars.  Use non-skid mats or decals in the tub or shower.  If you need to sit down in the shower, use a plastic, non-slip stool.  Keep the floor dry. Clean up any water that spills on the floor as soon as it happens.  Remove  soap buildup in the tub or shower regularly.  Attach bath mats securely with double-sided non-slip rug tape.  Do not have throw rugs and other things on the floor that can make you trip. What can I do in the bedroom?  Use night lights.  Make sure that you have a light by your bed that is easy to reach.  Do not use any sheets or blankets that are too big for your bed. They should not hang down onto the floor.  Have a firm chair that has side arms. You can use this for support while you get dressed.  Do not have throw rugs and other things on the floor that can make you trip. What can I do in the kitchen?  Clean up any spills right away.  Avoid walking on wet floors.  Keep items that you use a lot in easy-to-reach places.  If you need to reach something above you, use a strong step stool that has a grab bar.  Keep electrical cords out of the way.  Do not use floor polish or wax that makes floors slippery. If you must use wax, use non-skid floor wax.  Do not have throw rugs and  other things on the floor that can make you trip. What can I do with my stairs?  Do not leave any items on the stairs.  Make sure that there are handrails on both sides of the stairs and use them. Fix handrails that are broken or loose. Make sure that handrails are as long as the stairways.  Check any carpeting to make sure that it is firmly attached to the stairs. Fix any carpet that is loose or worn.  Avoid having throw rugs at the top or bottom of the stairs. If you do have throw rugs, attach them to the floor with carpet tape.  Make sure that you have a light switch at the top of the stairs and the bottom of the stairs. If you do not have them, ask someone to add them for you. What else can I do to help prevent falls?  Wear shoes that:  Do not have high heels.  Have rubber bottoms.  Are comfortable and fit you well.  Are closed at the toe. Do not wear sandals.  If you use a  stepladder:  Make sure that it is fully opened. Do not climb a closed stepladder.  Make sure that both sides of the stepladder are locked into place.  Ask someone to hold it for you, if possible.  Clearly mark and make sure that you can see:  Any grab bars or handrails.  First and last steps.  Where the edge of each step is.  Use tools that help you move around (mobility aids) if they are needed. These include:  Canes.  Walkers.  Scooters.  Crutches.  Turn on the lights when you go into a dark area. Replace any light bulbs as soon as they burn out.  Set up your furniture so you have a clear path. Avoid moving your furniture around.  If any of your floors are uneven, fix them.  If there are any pets around you, be aware of where they are.  Review your medicines with your doctor. Some medicines can make you feel dizzy. This can increase your chance of falling. Ask your doctor what other things that you can do to help prevent falls. This information is not intended to replace advice given to you by your health care provider. Make sure you discuss any questions you have with your health care provider. Document Released: 04/16/2009 Document Revised: 11/26/2015 Document Reviewed: 07/25/2014 Elsevier Interactive Patient Education  2017 [employer].

## 2018-05-18 ENCOUNTER — Other Ambulatory Visit: Payer: Self-pay

## 2018-05-18 ENCOUNTER — Encounter (HOSPITAL_COMMUNITY)
Admission: RE | Admit: 2018-05-18 | Discharge: 2018-05-18 | Disposition: A | Discharge status: Home / Self Care | Payer: Medicare Other | Source: Ambulatory Visit | Attending: Surgery | Admitting: Surgery

## 2018-05-18 ENCOUNTER — Encounter (HOSPITAL_COMMUNITY): Payer: Self-pay

## 2018-05-18 DIAGNOSIS — Z90710 Acquired absence of both cervix and uterus: Secondary | ICD-10-CM | POA: Diagnosis not present

## 2018-05-18 DIAGNOSIS — Z79899 Other long term (current) drug therapy: Secondary | ICD-10-CM | POA: Diagnosis not present

## 2018-05-18 DIAGNOSIS — Z01818 Encounter for other preprocedural examination: Secondary | ICD-10-CM

## 2018-05-18 DIAGNOSIS — Z825 Family history of asthma and other chronic lower respiratory diseases: Secondary | ICD-10-CM | POA: Diagnosis not present

## 2018-05-18 DIAGNOSIS — Z8619 Personal history of other infectious and parasitic diseases: Secondary | ICD-10-CM | POA: Diagnosis not present

## 2018-05-18 DIAGNOSIS — K219 Gastro-esophageal reflux disease without esophagitis: Secondary | ICD-10-CM | POA: Diagnosis not present

## 2018-05-18 DIAGNOSIS — J45909 Unspecified asthma, uncomplicated: Secondary | ICD-10-CM | POA: Diagnosis not present

## 2018-05-18 DIAGNOSIS — K8080 Other cholelithiasis without obstruction: Secondary | ICD-10-CM

## 2018-05-18 DIAGNOSIS — Z8489 Family history of other specified conditions: Secondary | ICD-10-CM | POA: Diagnosis not present

## 2018-05-18 DIAGNOSIS — K8010 Calculus of gallbladder with chronic cholecystitis without obstruction: Secondary | ICD-10-CM | POA: Diagnosis present

## 2018-05-18 DIAGNOSIS — E7800 Pure hypercholesterolemia, unspecified: Secondary | ICD-10-CM | POA: Diagnosis not present

## 2018-05-18 DIAGNOSIS — K828 Other specified diseases of gallbladder: Secondary | ICD-10-CM | POA: Diagnosis not present

## 2018-05-18 DIAGNOSIS — Z809 Family history of malignant neoplasm, unspecified: Secondary | ICD-10-CM | POA: Diagnosis not present

## 2018-05-18 LAB — COMPREHENSIVE METABOLIC PANEL
ALK PHOS: 75 IU/L (ref 39–117)
ALT: 19 IU/L (ref 0–32)
AST: 21 IU/L (ref 0–40)
Albumin/Globulin Ratio: 1.2 (ref 1.2–2.2)
Albumin: 4.2 g/dL (ref 3.5–4.8)
BUN/Creatinine Ratio: 27 (ref 12–28)
BUN: 11 mg/dL (ref 8–27)
Bilirubin Total: 0.3 mg/dL (ref 0.0–1.2)
CO2: 27 mmol/L (ref 20–29)
Calcium: 9.1 mg/dL (ref 8.7–10.3)
Chloride: 101 mmol/L (ref 96–106)
Creatinine, Ser: 0.41 mg/dL — ABNORMAL LOW (ref 0.57–1.00)
GFR calc Af Amer: 116 mL/min/{1.73_m2} (ref >59)
GFR, EST NON AFRICAN AMERICAN: 101 mL/min/{1.73_m2} (ref >59)
GLOBULIN, TOTAL: 3.4 g/dL (ref 1.5–4.5)
Glucose: 80 mg/dL (ref 65–99)
POTASSIUM: 4.4 mmol/L (ref 3.5–5.2)
SODIUM: 141 mmol/L (ref 134–144)
Total Protein: 7.6 g/dL (ref 6.0–8.5)

## 2018-05-18 LAB — CBC
HCT: 36.2 % (ref 36.0–46.0)
HEMATOCRIT: 36.7 % (ref 34.0–46.6)
HEMOGLOBIN: 11.2 g/dL — AB (ref 12.0–15.0)
Hemoglobin: 11.3 g/dL (ref 11.1–15.9)
MCH: 25.8 pg — AB (ref 26.6–33.0)
MCH: 26.5 pg (ref 26.0–34.0)
MCHC: 30.8 g/dL — AB (ref 31.5–35.7)
MCHC: 30.9 g/dL (ref 30.0–36.0)
MCV: 84 fL (ref 79–97)
MCV: 85.6 fL (ref 80.0–100.0)
PLATELETS: 246 10*3/uL (ref 150–450)
Platelets: 235 10*3/uL (ref 150–400)
RBC: 4.38 x10E6/uL (ref 3.77–5.28)
RBC: 4.23 MIL/uL (ref 3.87–5.11)
RDW: 14.2 % (ref 12.3–15.4)
RDW: 13.9 % (ref 11.5–15.5)
WBC: 7.8 10*3/uL (ref 3.4–10.8)
WBC: 7.2 10*3/uL (ref 4.0–10.5)
nRBC: 0 % (ref 0.0–0.2)

## 2018-05-18 LAB — LIPID PANEL
Chol/HDL Ratio: 2.7 ratio (ref 0.0–4.4)
Cholesterol, Total: 181 mg/dL (ref 100–199)
HDL: 68 mg/dL (ref >39)
LDL Calculated: 101 mg/dL — ABNORMAL HIGH (ref 0–99)
Triglycerides: 58 mg/dL (ref 0–149)
VLDL Cholesterol Cal: 12 mg/dL (ref 5–40)

## 2018-05-18 LAB — T4, FREE: FREE T4: 1.24 ng/dL (ref 0.82–1.77)

## 2018-05-18 LAB — HEMOGLOBIN A1C
Est. average glucose Bld gHb Est-mCnc: 111 mg/dL
Hgb A1c MFr Bld: 5.5 % (ref 4.8–5.6)

## 2018-05-18 LAB — TSH: TSH: 1.21 u[IU]/mL (ref 0.450–4.500)

## 2018-05-18 LAB — T3, FREE: T3, Free: 2.9 pg/mL (ref 2.0–4.4)

## 2018-05-18 MED ORDER — CHLORHEXIDINE GLUCONATE CLOTH 2 % EX PADS
6.0000 | MEDICATED_PAD | Freq: Once | CUTANEOUS | Status: DC
  Filled 2018-05-18: qty 6

## 2018-05-18 NOTE — Patient Instructions (Addendum)
Samantha [name]  05/18/2018   Your procedure is scheduled on: 05/21/2018   [facility]  Report to admitting at    0900 AM    Call this number if you have problems the morning of surgery [phone]   Remember: Do not eat food or drink liquids :After Midnight. BRUSH YOUR TEETH MORNING OF SURGERY AND RINSE YOUR MOUTH OUT, NO CHEWING GUM CANDY OR MINTS.     Take these medicines the morning of surgery with A SIP OF WATER: Inhalers as usual and bring, Prilosec, Flonase, Pravachol                                 You may not have any metal on your body including hair pins and              piercings  Do not wear jewelry, make-up, lotions, powders or perfumes, deodorant             Do not wear nail polish.  Do not shave  48 hours prior to surgery.     Do not bring valuables to the hospital. [name] IS NOT             RESPONSIBLE   FOR VALUABLES.  Contacts, dentures or bridgework may not be worn into surgery.  Leave suitcase in the car. After surgery it may be brought to your room.                      Please read over the following fact sheets you were given: _____________________________________________________________________             NO SOLID FOOD AFTER MIDNIGHT THE NIGHT PRIOR TO SURGERY. NOTHING BY MOUTH EXCEPT CLEAR LIQUIDS UNTIL 3 HOURS PRIOR TO SCHEULED SURGERY. PLEASE FINISH ENSURE DRINK PER SURGEON ORDER 3 HOURS PRIOR TO SCHEDULED SURGERY TIME WHICH NEEDS TO BE COMPLETED AT ____________.    CLEAR LIQUID DIET   Foods Allowed                                                                     Foods Excluded  Coffee and tea, regular and decaf                             liquids that you cannot  Plain Jell-O in any flavor                                             see through such as: Fruit ices (not with fruit pulp)                                     milk, soups, orange juice  Iced Popsicles  All solid food Carbonated beverages, regular and diet                                    Cranberry, grape and apple juices Sports drinks like Gatorade Lightly seasoned clear broth or consume(fat free) Sugar, honey syrup  Sample Menu Breakfast                                Lunch                                     Supper Cranberry juice                    Beef broth                            Chicken broth Jell-O                                     Grape juice                           Apple juice Coffee or tea                        Jell-O                                      Popsicle                                                Coffee or tea                        Coffee or tea  _____________________________________________________________________    [facility] - Preparing for Surgery Before surgery, you can play an important role.  Because skin is not sterile, your skin needs to be as free of germs as possible.  You can reduce the number of germs on your skin by washing with CHG (chlorahexidine gluconate) soap before surgery.  CHG is an antiseptic cleaner which kills germs and bonds with the skin to continue killing germs even after washing. Please DO NOT use if you have an allergy to CHG or antibacterial soaps.  If your skin becomes reddened/irritated stop using the CHG and inform your nurse when you arrive at Short Stay. Do not shave (including legs and underarms) for at least 48 hours prior to the first CHG shower.  You may shave your face/neck. Please follow these instructions carefully:  1.  Shower with CHG Soap the night before surgery and the  morning of Surgery.  2.  If you choose to wash your hair, wash your hair first as usual with your  normal  shampoo.  3.  After you shampoo, rinse your hair and body thoroughly to remove the  shampoo.  4.  Use CHG as you would any other liquid soap.  You can apply chg directly  to the skin and wash                        Gently with a scrungie or clean washcloth.  5.  Apply the CHG Soap to your body ONLY FROM THE NECK DOWN.   Do not use on face/ open                           Wound or open sores. Avoid contact with eyes, ears mouth and genitals (private parts).                       Wash face,  Genitals (private parts) with your normal soap.             6.  Wash thoroughly, paying special attention to the area where your surgery  will be performed.  7.  Thoroughly rinse your body with warm water from the neck down.  8.  DO NOT shower/wash with your normal soap after using and rinsing off  the CHG Soap.                9.  Pat yourself dry with a clean towel.            10.  Wear clean pajamas.            11.  Place clean sheets on your bed the night of your first shower and do not  sleep with pets. Day of Surgery : Do not apply any lotions/deodorants the morning of surgery.  Please wear clean clothes to the hospital/surgery center.  FAILURE TO FOLLOW THESE INSTRUCTIONS MAY RESULT IN THE CANCELLATION OF YOUR SURGERY PATIENT SIGNATURE_________________________________  NURSE SIGNATURE__________________________________  ________________________________________________________________________

## 2018-05-18 NOTE — Progress Notes (Signed)
Interpreter present at time of preop appt on 05/18/2018.

## 2018-05-18 NOTE — Progress Notes (Signed)
CMp done 05/17/18 faxed via epic to Dr Daphine DeutscherMartin

## 2018-05-18 NOTE — Progress Notes (Signed)
ekg 10-19-17 epic cxr 10-18-17 epic  

## 2018-05-20 MED ORDER — BUPIVACAINE LIPOSOME 1.3 % IJ SUSP
20.0000 mL | Freq: Once | INTRAMUSCULAR | Status: DC
  Filled 2018-05-20: qty 20

## 2018-05-21 ENCOUNTER — Ambulatory Visit (HOSPITAL_COMMUNITY): Payer: Medicare Other | Admitting: Anesthesiology

## 2018-05-21 ENCOUNTER — Other Ambulatory Visit: Payer: Self-pay

## 2018-05-21 ENCOUNTER — Encounter (HOSPITAL_COMMUNITY): Payer: Self-pay | Admitting: Anesthesiology

## 2018-05-21 ENCOUNTER — Encounter (HOSPITAL_COMMUNITY)
Admission: RE | Disposition: A | Discharge status: Home / Self Care | Payer: Self-pay | Source: Ambulatory Visit | Attending: Surgery

## 2018-05-21 ENCOUNTER — Observation Stay (HOSPITAL_COMMUNITY)
Admission: RE | Admit: 2018-05-21 | Discharge: 2018-05-22 | Disposition: A | Discharge status: Home / Self Care | Payer: Medicare Other | Source: Ambulatory Visit | Attending: Surgery | Admitting: Surgery

## 2018-05-21 DIAGNOSIS — K219 Gastro-esophageal reflux disease without esophagitis: Secondary | ICD-10-CM | POA: Insufficient documentation

## 2018-05-21 DIAGNOSIS — K828 Other specified diseases of gallbladder: Secondary | ICD-10-CM | POA: Diagnosis not present

## 2018-05-21 DIAGNOSIS — Z9049 Acquired absence of other specified parts of digestive tract: Secondary | ICD-10-CM

## 2018-05-21 DIAGNOSIS — Z825 Family history of asthma and other chronic lower respiratory diseases: Secondary | ICD-10-CM | POA: Insufficient documentation

## 2018-05-21 DIAGNOSIS — Z79899 Other long term (current) drug therapy: Secondary | ICD-10-CM | POA: Insufficient documentation

## 2018-05-21 DIAGNOSIS — Z8619 Personal history of other infectious and parasitic diseases: Secondary | ICD-10-CM | POA: Insufficient documentation

## 2018-05-21 DIAGNOSIS — Z8489 Family history of other specified conditions: Secondary | ICD-10-CM | POA: Insufficient documentation

## 2018-05-21 DIAGNOSIS — K8010 Calculus of gallbladder with chronic cholecystitis without obstruction: Principal | ICD-10-CM | POA: Insufficient documentation

## 2018-05-21 DIAGNOSIS — Z90710 Acquired absence of both cervix and uterus: Secondary | ICD-10-CM | POA: Insufficient documentation

## 2018-05-21 DIAGNOSIS — Z809 Family history of malignant neoplasm, unspecified: Secondary | ICD-10-CM | POA: Insufficient documentation

## 2018-05-21 DIAGNOSIS — J45909 Unspecified asthma, uncomplicated: Secondary | ICD-10-CM | POA: Diagnosis not present

## 2018-05-21 DIAGNOSIS — E7800 Pure hypercholesterolemia, unspecified: Secondary | ICD-10-CM | POA: Insufficient documentation

## 2018-05-21 HISTORY — PX: CHOLECYSTECTOMY: SHX55

## 2018-05-21 LAB — CBC
HCT: 35.6 % — ABNORMAL LOW (ref 36.0–46.0)
HEMOGLOBIN: 10.9 g/dL — AB (ref 12.0–15.0)
MCH: 26.3 pg (ref 26.0–34.0)
MCHC: 30.6 g/dL (ref 30.0–36.0)
MCV: 85.8 fL (ref 80.0–100.0)
NRBC: 0 % (ref 0.0–0.2)
Platelets: 216 10*3/uL (ref 150–400)
RBC: 4.15 MIL/uL (ref 3.87–5.11)
RDW: 13.9 % (ref 11.5–15.5)
WBC: 10.3 10*3/uL (ref 4.0–10.5)

## 2018-05-21 LAB — CREATININE, SERUM
Creatinine, Ser: 0.53 mg/dL (ref 0.44–1.00)
GFR calc Af Amer: >60 mL/min (ref >60)
GFR calc non Af Amer: >60 mL/min (ref >60)

## 2018-05-21 SURGERY — LAPAROSCOPIC CHOLECYSTECTOMY WITH INTRAOPERATIVE CHOLANGIOGRAM
Anesthesia: General

## 2018-05-21 MED ORDER — EPHEDRINE 5 MG/ML INJ
INTRAVENOUS | Status: AC
  Filled 2018-05-21: qty 10

## 2018-05-21 MED ORDER — BUPIVACAINE LIPOSOME 1.3 % IJ SUSP
INTRAMUSCULAR | Status: DC | PRN
  Administered 2018-05-21: 20 mL

## 2018-05-21 MED ORDER — ONDANSETRON HCL 4 MG/2ML IJ SOLN: 4.0000 mg | Freq: Four times a day (QID) | INTRAMUSCULAR | Status: DC | PRN

## 2018-05-21 MED ORDER — FENTANYL CITRATE (PF) 100 MCG/2ML IJ SOLN
25.0000 ug | INTRAMUSCULAR | Status: DC | PRN
  Administered 2018-05-21: 25 ug via INTRAVENOUS

## 2018-05-21 MED ORDER — ONDANSETRON HCL 4 MG/2ML IJ SOLN
INTRAMUSCULAR | Status: DC | PRN
  Administered 2018-05-21: 4 mg via INTRAVENOUS

## 2018-05-21 MED ORDER — LACTATED RINGERS IV SOLN
INTRAVENOUS | Status: DC | PRN
  Administered 2018-05-21: 11:00:00 via INTRAVENOUS

## 2018-05-21 MED ORDER — RINGERS IRRIGATION IR SOLN
Status: DC | PRN
  Administered 2018-05-21: 1000 mL

## 2018-05-21 MED ORDER — LACTATED RINGERS IV SOLN
INTRAVENOUS | Status: DC
  Administered 2018-05-21: 09:00:00 via INTRAVENOUS

## 2018-05-21 MED ORDER — ONDANSETRON 4 MG PO TBDP: 4.0000 mg | ORAL_TABLET | Freq: Four times a day (QID) | ORAL | Status: DC | PRN

## 2018-05-21 MED ORDER — CEFAZOLIN SODIUM-DEXTROSE 2-4 GM/100ML-% IV SOLN
2.0000 g | INTRAVENOUS | Status: AC
  Administered 2018-05-21: 2 g via INTRAVENOUS
  Filled 2018-05-21: qty 100

## 2018-05-21 MED ORDER — CEFAZOLIN SODIUM-DEXTROSE 2-4 GM/100ML-% IV SOLN
2.0000 g | Freq: Three times a day (TID) | INTRAVENOUS | Status: AC
  Administered 2018-05-21: 2 g via INTRAVENOUS
  Filled 2018-05-21: qty 100

## 2018-05-21 MED ORDER — DEXAMETHASONE SODIUM PHOSPHATE 10 MG/ML IJ SOLN
INTRAMUSCULAR | Status: DC | PRN
  Administered 2018-05-21: 8 mg via INTRAVENOUS

## 2018-05-21 MED ORDER — HEPARIN SODIUM (PORCINE) 5000 UNIT/ML IJ SOLN
5000.0000 [IU] | Freq: Once | INTRAMUSCULAR | Status: AC
  Administered 2018-05-21: 5000 [IU] via SUBCUTANEOUS
  Filled 2018-05-21: qty 1

## 2018-05-21 MED ORDER — FENTANYL CITRATE (PF) 100 MCG/2ML IJ SOLN
INTRAMUSCULAR | Status: AC
  Filled 2018-05-21: qty 2

## 2018-05-21 MED ORDER — FLUTICASONE PROPIONATE 50 MCG/ACT NA SUSP
1.0000 | Freq: Every day | NASAL | Status: DC
  Filled 2018-05-21: qty 16

## 2018-05-21 MED ORDER — ACETAMINOPHEN 500 MG PO TABS
1000.0000 mg | ORAL_TABLET | ORAL | Status: AC
  Administered 2018-05-21: 1000 mg via ORAL
  Filled 2018-05-21: qty 2

## 2018-05-21 MED ORDER — SUGAMMADEX SODIUM 200 MG/2ML IV SOLN
INTRAVENOUS | Status: AC
  Filled 2018-05-21: qty 2

## 2018-05-21 MED ORDER — GABAPENTIN 300 MG PO CAPS
300.0000 mg | ORAL_CAPSULE | ORAL | Status: AC
  Administered 2018-05-21: 300 mg via ORAL
  Filled 2018-05-21: qty 1

## 2018-05-21 MED ORDER — ROCURONIUM BROMIDE 100 MG/10ML IV SOLN
INTRAVENOUS | Status: AC
  Filled 2018-05-21: qty 1

## 2018-05-21 MED ORDER — MEPERIDINE HCL 50 MG/ML IJ SOLN: 6.2500 mg | INTRAMUSCULAR | Status: DC | PRN

## 2018-05-21 MED ORDER — MOMETASONE FURO-FORMOTEROL FUM 200-5 MCG/ACT IN AERO
2.0000 | INHALATION_SPRAY | Freq: Two times a day (BID) | RESPIRATORY_TRACT | Status: DC
  Administered 2018-05-21: 2 via RESPIRATORY_TRACT
  Filled 2018-05-21: qty 8.8

## 2018-05-21 MED ORDER — HEPARIN SODIUM (PORCINE) 5000 UNIT/ML IJ SOLN
5000.0000 [IU] | Freq: Three times a day (TID) | INTRAMUSCULAR | Status: DC
  Administered 2018-05-22: 5000 [IU] via SUBCUTANEOUS
  Filled 2018-05-21: qty 1

## 2018-05-21 MED ORDER — PROPOFOL 10 MG/ML IV BOLUS
INTRAVENOUS | Status: DC | PRN
  Administered 2018-05-21: 120 mg via INTRAVENOUS

## 2018-05-21 MED ORDER — KCL IN DEXTROSE-NACL 20-5-0.45 MEQ/L-%-% IV SOLN
INTRAVENOUS | Status: DC
  Administered 2018-05-21 – 2018-05-22 (×2): via INTRAVENOUS
  Filled 2018-05-21 (×2): qty 1000

## 2018-05-21 MED ORDER — FENTANYL CITRATE (PF) 100 MCG/2ML IJ SOLN
INTRAMUSCULAR | Status: DC | PRN
  Administered 2018-05-21 (×2): 25 ug via INTRAVENOUS
  Administered 2018-05-21: 50 ug via INTRAVENOUS

## 2018-05-21 MED ORDER — LIDOCAINE 2% (20 MG/ML) 5 ML SYRINGE
INTRAMUSCULAR | Status: DC | PRN
  Administered 2018-05-21: 60 mg via INTRAVENOUS

## 2018-05-21 MED ORDER — SUGAMMADEX SODIUM 200 MG/2ML IV SOLN
INTRAVENOUS | Status: DC | PRN
  Administered 2018-05-21: 150 mg via INTRAVENOUS

## 2018-05-21 MED ORDER — SODIUM CHLORIDE (PF) 0.9 % IJ SOLN
INTRAMUSCULAR | Status: AC
  Filled 2018-05-21: qty 10

## 2018-05-21 MED ORDER — EPHEDRINE SULFATE-NACL 50-0.9 MG/10ML-% IV SOSY
PREFILLED_SYRINGE | INTRAVENOUS | Status: DC | PRN
  Administered 2018-05-21: 5 mg via INTRAVENOUS
  Administered 2018-05-21 (×2): 10 mg via INTRAVENOUS

## 2018-05-21 MED ORDER — IOPAMIDOL (ISOVUE-300) INJECTION 61%
INTRAVENOUS | Status: AC
  Filled 2018-05-21: qty 50

## 2018-05-21 MED ORDER — PANTOPRAZOLE SODIUM 40 MG IV SOLR
40.0000 mg | Freq: Every day | INTRAVENOUS | Status: DC
  Administered 2018-05-21: 40 mg via INTRAVENOUS
  Filled 2018-05-21: qty 40

## 2018-05-21 MED ORDER — ALBUTEROL SULFATE (2.5 MG/3ML) 0.083% IN NEBU: 3.0000 mL | INHALATION_SOLUTION | Freq: Four times a day (QID) | RESPIRATORY_TRACT | Status: DC | PRN

## 2018-05-21 MED ORDER — MORPHINE SULFATE (PF) 2 MG/ML IV SOLN
1.0000 mg | INTRAVENOUS | Status: DC | PRN
  Administered 2018-05-22: 1 mg via INTRAVENOUS
  Filled 2018-05-21: qty 1

## 2018-05-21 MED ORDER — HYDRALAZINE HCL 20 MG/ML IJ SOLN: 10.0000 mg | INTRAMUSCULAR | Status: DC | PRN

## 2018-05-21 MED ORDER — LIDOCAINE HCL 2 % IJ SOLN
INTRAMUSCULAR | Status: AC
  Filled 2018-05-21: qty 20

## 2018-05-21 MED ORDER — ROCURONIUM BROMIDE 10 MG/ML (PF) SYRINGE
PREFILLED_SYRINGE | INTRAVENOUS | Status: DC | PRN
  Administered 2018-05-21: 50 mg via INTRAVENOUS

## 2018-05-21 SURGICAL SUPPLY — 43 items
ADH SKN CLS APL DERMABOND .7 (GAUZE/BANDAGES/DRESSINGS) ×1
APL SKNCLS STERI-STRIP NONHPOA (GAUZE/BANDAGES/DRESSINGS)
APL SWBSTK 6 STRL LF DISP (MISCELLANEOUS) ×2
APPLICATOR COTTON TIP 6 STRL (MISCELLANEOUS) ×2 IMPLANT
APPLICATOR COTTON TIP 6IN STRL (MISCELLANEOUS) ×4
APPLIER CLIP ROT 10 11.4 M/L (STAPLE) ×2
APR CLP MED LRG 11.4X10 (STAPLE) ×1
BAG SPEC RTRVL 10 TROC 200 (ENDOMECHANICALS)
BENZOIN TINCTURE PRP APPL 2/3 (GAUZE/BANDAGES/DRESSINGS) IMPLANT
CABLE HIGH FREQUENCY MONO STRZ (ELECTRODE) ×2 IMPLANT
CATH REDDICK CHOLANGI 4FR 50CM (CATHETERS) ×2 IMPLANT
CLIP APPLIE ROT 10 11.4 M/L (STAPLE) ×1 IMPLANT
COVER MAYO STAND STRL (DRAPES) ×2 IMPLANT
COVER SURGICAL LIGHT HANDLE (MISCELLANEOUS) ×2 IMPLANT
COVER WAND RF STERILE (DRAPES) ×1 IMPLANT
DECANTER SPIKE VIAL GLASS SM (MISCELLANEOUS) ×1 IMPLANT
DERMABOND ADVANCED (GAUZE/BANDAGES/DRESSINGS) ×1
DERMABOND ADVANCED .7 DNX12 (GAUZE/BANDAGES/DRESSINGS) ×1 IMPLANT
DRAPE C-ARM 42X120 X-RAY (DRAPES) ×2 IMPLANT
ELECT PENCIL ROCKER SW 15FT (MISCELLANEOUS) IMPLANT
ELECT REM PT RETURN 15FT ADLT (MISCELLANEOUS) ×2 IMPLANT
GLOVE BIOGEL M 8.0 STRL (GLOVE) ×2 IMPLANT
GOWN STRL REUS W/TWL XL LVL3 (GOWN DISPOSABLE) ×6 IMPLANT
GRASPER SUT TROCAR 14GX15 (MISCELLANEOUS) ×1 IMPLANT
HEMOSTAT SURGICEL 4X8 (HEMOSTASIS) IMPLANT
IV CATH 14GX2 1/4 (CATHETERS) ×2 IMPLANT
KIT BASIN OR (CUSTOM PROCEDURE TRAY) ×2 IMPLANT
L-HOOK LAP DISP 36CM (ELECTROSURGICAL) ×2
LHOOK LAP DISP 36CM (ELECTROSURGICAL) IMPLANT
POUCH RETRIEVAL ECOSAC 10 (ENDOMECHANICALS) IMPLANT
POUCH RETRIEVAL ECOSAC 10MM (ENDOMECHANICALS)
SCISSORS LAP 5X45 EPIX DISP (ENDOMECHANICALS) ×2 IMPLANT
SET IRRIG TUBING LAPAROSCOPIC (IRRIGATION / IRRIGATOR) ×2 IMPLANT
SLEEVE XCEL OPT CAN 5 100 (ENDOMECHANICALS) ×2 IMPLANT
STRIP CLOSURE SKIN 1/2X4 (GAUZE/BANDAGES/DRESSINGS) IMPLANT
SUT MNCRL AB 4-0 PS2 18 (SUTURE) ×2 IMPLANT
SYR 20CC LL (SYRINGE) ×2 IMPLANT
TOWEL OR 17X26 10 PK STRL BLUE (TOWEL DISPOSABLE) ×2 IMPLANT
TRAY LAPAROSCOPIC (CUSTOM PROCEDURE TRAY) ×2 IMPLANT
TROCAR BLADELESS OPT 5 100 (ENDOMECHANICALS) ×2 IMPLANT
TROCAR XCEL BLUNT TIP 100MML (ENDOMECHANICALS) IMPLANT
TROCAR XCEL NON-BLD 11X100MML (ENDOMECHANICALS) ×2 IMPLANT
TUBING INSUF HEATED (TUBING) ×2 IMPLANT

## 2018-05-21 NOTE — Transfer of Care (Signed)
Immediate Anesthesia Transfer of Care Note  Patient: Samantha [name]  Procedure(s) Performed: Procedure(s): LAPAROSCOPIC CHOLECYSTECTOMY WITH ATTEMPTED INTRAOPERATIVE CHOLANGIOGRAM (N/A)  Patient Location: PACU  Anesthesia Type:General  Level of Consciousness:  sedated, patient cooperative and responds to stimulation  Airway & Oxygen Therapy:Patient Spontanous Breathing and Patient connected to face mask oxgen  Post-op Assessment:  Report given to PACU RN and Post -op Vital signs reviewed and stable  Post vital signs:  Reviewed and stable  Last Vitals:  Vitals:   05/21/18 0809  BP: (!) 154/77  Pulse: 72  Resp: 16  Temp: (!) 36.4 C  SpO2: 100%    Complications: No apparent anesthesia complications

## 2018-05-21 NOTE — Anesthesia Preprocedure Evaluation (Addendum)
Anesthesia Evaluation  Patient identified by MRN, date of birth, ID band Patient awake    Reviewed: Allergy & Precautions, H&P , NPO status , Patient's Chart, lab work & pertinent test results, reviewed documented beta blocker date and time   Airway Mallampati: II  TM Distance: >3 FB Neck ROM: full    Dental no notable dental hx.    Pulmonary shortness of breath, asthma ,    Pulmonary exam normal breath sounds clear to auscultation       Cardiovascular Exercise Tolerance: Good negative cardio ROS   Rhythm:regular Rate:Normal     Neuro/Psych negative neurological ROS  negative psych ROS   GI/Hepatic Neg liver ROS, GERD  Medicated,  Endo/Other  negative endocrine ROS  Renal/GU negative Renal ROS  negative genitourinary   Musculoskeletal   Abdominal   Peds  Hematology negative hematology ROS (+)   Anesthesia Other Findings   Reproductive/Obstetrics negative OB ROS                             Anesthesia Physical Anesthesia Plan  ASA: III  Anesthesia Plan: General   Post-op Pain Management:    Induction: Intravenous  PONV Risk Score and Plan: 3 and Ondansetron, Treatment may vary due to age or medical condition and Dexamethasone  Airway Management Planned: Oral ETT  Additional Equipment:   Intra-op Plan:   Post-operative Plan: Extubation in OR  Informed Consent: I have reviewed the patients History and Physical, chart, labs and discussed the procedure including the risks, benefits and alternatives for the proposed anesthesia with the patient or authorized representative who has indicated his/her understanding and acceptance.   Dental Advisory Given  Plan Discussed with: CRNA, Anesthesiologist and Surgeon  Anesthesia Plan Comments: (  )        Anesthesia Quick Evaluation

## 2018-05-21 NOTE — H&P (Signed)
Chief Complaint:  Gallstones and right upper quadrant pain  History of Present Illness:  Samantha [name] is an 77 y.o. female who was seen in the office with an interpreter who speaks the [facility] language.  She has had attacks of cholecystitis and with the aid of the interpreter, informed consent was obtained including her options about surgery or not and with surgery the more serious complications that could occur.     Past Medical History:  Diagnosis Date  . Asthma   . GERD (gastroesophageal reflux disease)   . H. pylori infection   . Hypercholesteremia   . UTI (lower urinary tract infection)     Past Surgical History:  Procedure Laterality Date  . ABDOMINAL HYSTERECTOMY    . ABDOMINAL SURGERY    . CESAREAN SECTION      Current Facility-Administered Medications  Medication Dose Route Frequency Provider Last Rate Last Dose  . bupivacaine liposome (EXPAREL) 1.3 % injection 266 mg  20 mL Infiltration Once Luretha MurphyMartin, Jannat Rosemeyer, MD      . ceFAZolin (ANCEF) IVPB 2g/100 mL premix  2 g Intravenous On Call to OR Luretha MurphyMartin, Diem Pagnotta, MD      . lactated ringers infusion   Intravenous Continuous Heather RobertsSinger, James, MD 50 mL/hr at 05/21/18 0845     Contrast media [iodinated diagnostic agents] and Other Family History  Problem Relation Age of Onset  . Cancer Mother   . Asthma Father   . Allergic rhinitis Father   . Food Allergy Father   . Urticaria Neg Hx   . Eczema Neg Hx   . Angioedema Neg Hx    Social History:   reports that she has never smoked. She has never used smokeless tobacco. She reports that she does not drink alcohol or use drugs.   REVIEW OF SYSTEMS : Negative except for see problem list  Physical Exam:   Blood pressure (!) 154/77, pulse 72, temperature (!) 97.5 F (36.4 C), temperature source Oral, resp. rate 16, SpO2 100 %. There is no height or weight on file to calculate BMI.  Gen:  WDWN Asian female NAD  Neurological: Alert and oriented to person, place, and time. Motor and  sensory function is grossly intact  Head: Normocephalic and atraumatic.  Eyes: Conjunctivae are normal. Pupils are equal, round, and reactive to light. No scleral icterus.  Neck: Normal range of motion. Neck supple. No tracheal deviation or thyromegaly present.  Cardiovascular:  SR without murmurs or gallops.  No carotid bruits Breast:  Not examined Respiratory: Effort normal.  No respiratory distress. No chest wall tenderness. Breath sounds normal.  No wheezes, rales or rhonchi.  Abdomen:  Sore but nontender GU:  Not examined Musculoskeletal: Normal range of motion. Extremities are nontender. No cyanosis, edema or clubbing noted Lymphadenopathy: No cervical, preauricular, postauricular or axillary adenopathy is present Skin: Skin is warm and dry. No rash noted. No diaphoresis. No erythema. No pallor. Pscyh: Normal mood and affect. Behavior is normal. Judgment and thought content normal.   LABORATORY RESULTS: No results found for this or any previous visit (from the past 48 hour(s)).   RADIOLOGY RESULTS: No results found.  Problem List: Patient Active Problem List   Diagnosis Date Noted  . Chronic venous insufficiency 05/01/2015  . Varicose veins of lower extremities with complications 05/01/2015  . Colitis 12/19/2012  . Dyslipidemia 12/19/2012  . ASTHMA 02/15/2008  . SHORTNESS OF BREATH (SOB) 02/15/2008  . CHEST PAIN-UNSPECIFIED 02/15/2008    Assessment & Plan: Chronic cholecystitis for lap chole with  IOC    Matt B. Daphine Deutscher, MD, [facility] [phone] beeper [phone]  05/21/2018 10:54 AM

## 2018-05-21 NOTE — Op Note (Signed)
Samantha [name]  Primary Care Physician:  Dorothyann PengSanders, Robyn, MD    05/21/2018  12:37 PM  Procedure: Laparoscopic Cholecystectomy with intraoperative cholangiogram  Surgeon: Susy FrizzleMatt B. Daphine DeutscherMartin, MD, FACS Asst:  none  Anes:  General  Drains:  None  Findings: Chronic cholecystitis; attempted IOC but couldn't get catheter to thread;  Critical view obtained.    Description of Procedure: The patient was taken to OR 4 and given general anesthesia.  The patient was prepped with PCMX and draped sterilely. A time out was performed.  Access to the abdomen was achieved with a 5 mm Optiview through the right upper quadrant.  Port placement included three 5 mm trocars and one 11 in the upper midline.    The gallbladder was visualized and the fundus was grasped and the gallbladder was elevated. Traction on the infundibulum allowed for successful demonstration of the critical view. Inflammatory changes were chronic and were adhesions from the infundibulum to the fundus which were taken down sharply.  The cystic duct was identified and clipped up on the gallbladder and an incision was made in the cystic duct and the Reddick catheter was inserted but it could not thread.  Critical view was present and the cystic duct was small and long.  The cystic duct was then triple clipped and divided, the cystic artery was double clipped and divided and then the gallbladder was removed from the gallbladder bed. Removal of the gallbladder from the gallbladder bed was performed without entering it.  The gallbladder was then placed in a bag and brought out through one of the trocar sites. The gallbladder bed was inspected and no bleeding or bile leaks were seen.   Laparoscopic visualization was used when closing fascial defects for trocar sites.   Incisions were injected with Exparel and closed with 4-0 Monocryl and Dermabond on the skin.  Sponge and needle count were correct.    The patient was taken to the recovery room in satisfactory  condition.

## 2018-05-21 NOTE — Anesthesia Procedure Notes (Signed)
Procedure Name: Intubation Date/Time: 05/21/2018 11:33 AM Performed by: Lavina Hamman, CRNA Pre-anesthesia Checklist: Patient identified, Emergency Drugs available, Suction available, Patient being monitored and Timeout performed Patient Re-evaluated:Patient Re-evaluated prior to induction Oxygen Delivery Method: Circle system utilized Preoxygenation: Pre-oxygenation with 100% oxygen Induction Type: IV induction Ventilation: Mask ventilation without difficulty Laryngoscope Size: Mac and 3 Grade View: Grade I Tube type: Oral Tube size: 7.0 mm Number of attempts: 1 Airway Equipment and Method: Stylet Placement Confirmation: ETT inserted through vocal cords under direct vision,  positive ETCO2,  CO2 detector and breath sounds checked- equal and bilateral Secured at: 21 cm Tube secured with: Tape Dental Injury: Teeth and Oropharynx as per pre-operative assessment

## 2018-05-21 NOTE — Anesthesia Postprocedure Evaluation (Signed)
Anesthesia Post Note  Patient: Samantha [name]  Procedure(s) Performed: LAPAROSCOPIC CHOLECYSTECTOMY WITH ATTEMPTED INTRAOPERATIVE CHOLANGIOGRAM (N/A )     Patient location during evaluation: PACU Anesthesia Type: General Level of consciousness: awake and alert Pain management: pain level controlled Vital Signs Assessment: post-procedure vital signs reviewed and stable Respiratory status: spontaneous breathing, nonlabored ventilation, respiratory function stable and patient connected to nasal cannula oxygen Cardiovascular status: blood pressure returned to baseline and stable Postop Assessment: no apparent nausea or vomiting Anesthetic complications: no    Last Vitals:  Vitals:   05/21/18 1744 05/21/18 1955  BP:    Pulse:    Resp:    Temp: (!) 36.4 C   SpO2:  97%    Last Pain:  Vitals:   05/21/18 1933  TempSrc:   PainSc: 0-No pain   Pain Goal:                 Tondra Reierson

## 2018-05-22 ENCOUNTER — Encounter (HOSPITAL_COMMUNITY): Payer: Self-pay | Admitting: Surgery

## 2018-05-22 DIAGNOSIS — K8010 Calculus of gallbladder with chronic cholecystitis without obstruction: Secondary | ICD-10-CM | POA: Diagnosis not present

## 2018-05-22 LAB — COMPREHENSIVE METABOLIC PANEL
ALBUMIN: 3.5 g/dL (ref 3.5–5.0)
ALK PHOS: 51 U/L (ref 38–126)
ALT: 33 U/L (ref 0–44)
AST: 56 U/L — ABNORMAL HIGH (ref 15–41)
Anion gap: 8 (ref 5–15)
BILIRUBIN TOTAL: 0.6 mg/dL (ref 0.3–1.2)
BUN: 9 mg/dL (ref 8–23)
CHLORIDE: 104 mmol/L (ref 98–111)
CO2: 26 mmol/L (ref 22–32)
CREATININE: 0.42 mg/dL — AB (ref 0.44–1.00)
Calcium: 8.8 mg/dL — ABNORMAL LOW (ref 8.9–10.3)
GFR calc non Af Amer: >60 mL/min (ref >60)
GLUCOSE: 137 mg/dL — AB (ref 70–99)
Potassium: 4.7 mmol/L (ref 3.5–5.1)
Sodium: 138 mmol/L (ref 135–145)
Total Protein: 6.9 g/dL (ref 6.5–8.1)

## 2018-05-22 LAB — CBC
HCT: 34.9 % — ABNORMAL LOW (ref 36.0–46.0)
HEMOGLOBIN: 10.7 g/dL — AB (ref 12.0–15.0)
MCH: 25.9 pg — ABNORMAL LOW (ref 26.0–34.0)
MCHC: 30.7 g/dL (ref 30.0–36.0)
MCV: 84.5 fL (ref 80.0–100.0)
Platelets: 223 10*3/uL (ref 150–400)
RBC: 4.13 MIL/uL (ref 3.87–5.11)
RDW: 13.9 % (ref 11.5–15.5)
WBC: 7.4 10*3/uL (ref 4.0–10.5)
nRBC: 0 % (ref 0.0–0.2)

## 2018-05-22 MED ORDER — HYDROCODONE-ACETAMINOPHEN 5-325 MG PO TABS: 1.0000 | ORAL_TABLET | Freq: Four times a day (QID) | ORAL | 0 refills | Status: DC | PRN

## 2018-05-22 NOTE — Progress Notes (Signed)
Paged md for advancing diet and po pain medcations. .Marland Kitchen

## 2018-05-22 NOTE — Discharge Instructions (Signed)
Th? thu?t c?t ti m?t b?ng n?i soi ? b?ng Laparoscopic Cholecystectomy Th? thu?t c?t ti m?t b?ng n?i soi ? b?ng l ph?u thu?t c?t b? ti m?t. Ti m?t l m?t b? ph?n c hnh qu? l n?m d??i gan ? bn ph?i c? th?. Ti m?t d? tr? m?t, m?t ch?t d?ch gip c? th? tiu ha ch?t bo. Th? thu?t c?t ti m?t th??ng ???c th?c hi?n ?? tr? tnh tr?ng vim ? ti m?t (vim ti m?t). Tnh tr?ng ny th??ng l do tch t? s?i m?t s?i m?t) trong ti m?t. S?i m?t c th? lm t?c dng ch?y c?a m?t, c th? d?n ??n vim v ?au. Trong nh?ng tr??ng h?p n?ng, c th? c?n ph?i m? c?p c?u. Th? thu?t ny ???c th?c hi?n thng qua cc v?t m? nh? ? b?ng qu v? (ph?u thu?t n?i soi ? b?ng). M?t ?ng m?nh g?n camera (?ng soi ? b?ng) ???c ??a vo thng qua m?t v?t m?. Cc d?ng c? ph?u thu?t m?nh ???c lu?n qua cc v?t m? khc. Trong m?t s? tr??ng h?p, th? thu?t n?i soi ? b?ng c th? ???c chuy?n thnh ki?u ph?u thu?t ???c th?c hi?n thng qua m?t v?t m? r?ng h?n (ph?u thu?t m?). Hy cho chuyn gia ch?m [place] s?c kh?e bi?t v?:  B?t k? v?n ?? d? ?ng no m qu v? c.  T?t c? cc lo?i thu?c m qu v? ?ang s? d?ng, bao g?m c? vitamin, th?o d??c, thu?c nh? m?t, thu?c d?ng kem v thu?c khng k ??n.  B?t k? v?n ?? g m qu v? ho?c cc thnh vin trong gia ?nh ? g?p ph?i v?i thu?c gy m.  B?t k? b?nh l v? mu no m qu v? ? b?.  B?t k? l?n ph?u thu?t no qu v? ? c.  B?t k? tnh tr?ng b?nh l no m qu v? c.  Li?u qu v? c ?ang mang thai ho?c c th? c thai hay khng. Cc nguy c? l g? Ni chung, ?y l m?t th? thu?t an ton. Tuy nhin, cc v?n ?? c th? x?y ra, bao g?m:  Nhi?m trng.  Ch?y mu.  Ph?n ?ng di? ??ng v?i thu?c.  Gy th??ng t?n ca?c c?u tru?c ho??c c? quan kha?c.  S?i cn st l?i trong ?ng m?t ch?. ?ng m?t ch? d?n m?t t? ti m?t vo ru?t non.  R r? m?t t? ?ng m?t ?ang ???c k?p khi ti m?t ???c c?t b?.  ?i?u g x?y ra tr??c khi lm th? thu?t? Hy ?? c? th? ?? n??c. Tun th? ch? d?n c?a chuyn gia ch?m [place]  s?c kh?e v? duy tr ?? n??c, c th? bao g?m:  T?i ?a 2 ti?ng tr??c khi lm th? thu?t - qu v? c th? ti?p t?c u?ng ?? l?ng trong, ch?ng h?n nh? n??c, n??c p tri cy trong, c ph ?en v tr nguyn ch?t.  Nh?ng h?n ch? v? ?n v u?ng Tun th? ch? d?n c?a chuyn gia ch?m  s?c kh?e v? ?n v u?ng, c th? bao g?m:  8 gi? tr??c khi ti?n hnh th? thu?t - d?ng ?n b?a ?n ho?c th?c ph?m kh tiu nh? th?t, th?c ph?m chin/rn, ho?c th?c ph?m nhi?u ch?t bo.  6 gi? tr??c khi ti?n hnh th? thu?t - d?ng ?n cc b?a ?n nh?, ho?c cc lo?i th?c ph?m nh? bnh m n??ng ho?c ng? c?c.  6 gi? tr??c khi ti?n hnh th? thu?t - d?ng u?ng s?a ho?c ?? u?ng c s?a.  2 gi?  tr??c khi ti?n hnh th? thu?t - d?ng u?ng ?? l?ng trong.  Thu?c  Hy h?i chuyn gia ch?m [place] s?c kh?e v?: ? Vi?c thay ??i ho?c d?ng s? d?ng cc lo?i thu?c th??ng xuyn dng c?a qu v?. ?i?u ny ??c bi?t quan tr?ng n?u qu v? ?ang dng thu?c ?i?u tr? ti?u ???ng ho?c thu?c lm long mu. ? ?ang dng cc lo?i thu?c nh? aspirin v ibuprofen. Nh?ng thu?c ny c th? lm long mu. Khng dng nh?ng lo?i thu?c ny tr??c khi lm th? thu?t n?u chuyn gia ch?m [place] s?c kh?e khuyn qu v? khng dng.  Qu v? c th? ???c cho dng khng sinh ?? gip ng?n ng?a nhi?m trng. H??ng d?n chung  Hy cho chuyn gia ch?m [place] s?c kh?e bi?t qu v? c b? c?m l?nh hay b?t k? nhi?m trng no tr??c khi lm ph?u thu?t hay khng.  C k? ho?ch nh? ai ? ??a quy? vi? t? b?nh vi?n ho?c t? phng khm v? nh.  Hy h?i chuyn gia ch?m [place] s?c kh?e v? vi?c v?t m? c?a qu v? s? ???c ?nh d?u ho?c xc ??nh nh? th? no. ?i?u g x?y ra trong qu trnh th?c hi?n th? thu?t?  ?? gi?m nguy c? nhi?m trng: ? ??i ng? nhn vin y t? s? r?a ho?c st trng tay c?a h?. ? Da c?a qu v? s? ???c r?a b?ng x phng. ? Lng ? vng ph?u thu?t c th? ???c c?o s?ch.  Qu v? c th? ???c ??t m?t ???ng truy?n t?nh m?ch (IV) vo m?t trong cc t?nh m?ch.  Qu v? s? ???c cho dng m?t ho?c nhi?u trong s?  nh?ng lo?i thu?c sau: ? Thu?c ?? gip quy? vi? th? gin (thu?c an th?n). ? Thu?c lm qu v? ng? (thu?c gy m toa?n thn).  M?t ?ng th? s? ???c ??t vo trong mi?ng qu v?.  Bc s? ph?u thu?t s? t?o nhi?u v?t r?ch nh? (v?t m?) ? ? b?ng qu v?.  ?ng soi ? b?ng s? ???c lu?n vo thng qua m?t trong cc v?t m? nh?. Camera trn ?ng soi ? b?ng s? g?i cc hnh ?nh ??n m?t mn hnh TV (mn hnh) trong phng m?Marland Kitchen Vi?c ny cho php bc s? ph?u thu?t nhn th?y bn trong ? b?ng c?a qu v?.  M?t lo?i kh gi?ng khng kh s? ???c b?m vo bn trong ? b?ng qu v?. ?i?u ny s? lm b?ng qu v? to ln v gip bc s? ph?u thu?t c thm khng gian ?? th?c hi?n ph?u thu?t.  Cc d?ng c? khc c?n thi?t cho th? thu?t s? ???c ??a vo thng qua cc v?t r?ch khc. Ti m?t s? ???c l?y ra qua m?t trong cc v?t r?ch ny.  Ti m?t ch? c?a qu v? c th? ???c ki?m tra. N?u th?y s?i trong ?ng m?t ch?, th c th? l?y s?i ra.  Sau khi ti m?t c?a qu v? ? ???c c?t b?, cc v?t m? s? ???c ?ng l?i b?ng cc m?i khu (ch? ph?u thu?t), ghim khu ph?u thu?t ho?c keo dn da.  Cc v?t m? c?a qu v? c th? ???c ph? b?ng b?ng (b?ng g?c). Th? thu?t ny c th? khc nhau gi?a cc chuyn gia ch?m [place] s?c kh?e v cc b?nh vi?n. ?i?u g x?y ra sau khi lm th? thu?t?  Huy?t p, nh?p tim, nh?p th? v n?ng ??  xi trong mu c?a qu v? s? ???c theo di cho ??n khi thu?c qu v? ? dng h?t  tc d?ng.  Qu v? s? ???c cho thu?c ?? ki?m sot c?n ?au n?u c?n.  Khng li xe trong vng 24 gi? n?u qu v? ? ???c cho dng thu?c an th?n. Thng tin ny khng nh?m m?c ?ch thay th? cho l?i khuyn m chuyn gia ch?m [place] s?c kh?e ni v?i qu v?. Hy b?o ??m qu v? ph?i th?o lu?n b?t k? v?n ?? g m qu v? c v?i chuyn gia ch?m [place] s?c kh?e c?a qu v?. Document Released: 10/12/2015 Document Revised: 10/06/2016 Document Reviewed: 12/07/2015 Elsevier Interactive Patient Education  2018 [employer].

## 2018-05-22 NOTE — Plan of Care (Signed)
  Problem: Skin Integrity: Goal: Demonstration of wound healing without infection will improve Outcome: Progressing   

## 2018-05-22 NOTE — Progress Notes (Signed)
Patient requesting to advance diet and something for mild pain. Paged md for oral pain med .

## 2018-05-22 NOTE — Plan of Care (Signed)
  Problem: Education: Goal: Knowledge of General Education information will improve Description Including pain rating scale, medication(s)/side effects and non-pharmacologic comfort measures 05/22/2018 1451 by Cordie Griceodriguez, Deondrea Markos A, RN Outcome: Adequate for Discharge 05/22/2018 0956 by Cordie Griceodriguez, Bree Heinzelman A, RN Outcome: Progressing   Problem: Health Behavior/Discharge Planning: Goal: Ability to manage health-related needs will improve 05/22/2018 1451 by Cordie Griceodriguez, Tanish Prien A, RN Outcome: Adequate for Discharge 05/22/2018 0956 by Cordie Griceodriguez, Anesha Hackert A, RN Outcome: Progressing   Problem: Clinical Measurements: Goal: Ability to maintain clinical measurements within normal limits will improve 05/22/2018 1451 by Cordie Griceodriguez, Ramonia Mcclaran A, RN Outcome: Adequate for Discharge 05/22/2018 0956 by Cordie Griceodriguez, Brae Gartman A, RN Outcome: Progressing Goal: Will remain free from infection 05/22/2018 1451 by Cordie Griceodriguez, Yousaf Sainato A, RN Outcome: Adequate for Discharge 05/22/2018 0956 by Cordie Griceodriguez, Laurelyn Terrero A, RN Outcome: Progressing Goal: Diagnostic test results will improve 05/22/2018 1451 by Cordie Griceodriguez, Pierra Skora A, RN Outcome: Adequate for Discharge 05/22/2018 0956 by Cordie Griceodriguez, Chivas Notz A, RN Outcome: Progressing Goal: Respiratory complications will improve 05/22/2018 1451 by Cordie Griceodriguez, Kasi Lasky A, RN Outcome: Adequate for Discharge 05/22/2018 0956 by Cordie Griceodriguez, Deiontae Rabel A, RN Outcome: Progressing Goal: Cardiovascular complication will be avoided 05/22/2018 1451 by Cordie Griceodriguez, Pacen Watford A, RN Outcome: Adequate for Discharge 05/22/2018 0956 by Cordie Griceodriguez, Isaak Delmundo A, RN Outcome: Progressing   Problem: Activity: Goal: Risk for activity intolerance will decrease 05/22/2018 1451 by Cordie Griceodriguez, Pascual Mantel A, RN Outcome: Adequate for Discharge 05/22/2018 0956 by Cordie Griceodriguez, Dixie Jafri A, RN Outcome: Progressing   Problem: Nutrition: Goal: Adequate nutrition will be maintained 05/22/2018 1451 by Cordie Griceodriguez, Elianah Karis A, RN Outcome: Adequate for  Discharge 05/22/2018 0956 by Cordie Griceodriguez, Brooke Payes A, RN Outcome: Progressing   Problem: Coping: Goal: Level of anxiety will decrease 05/22/2018 1451 by Cordie Griceodriguez, Elie Leppo A, RN Outcome: Adequate for Discharge 05/22/2018 0956 by Cordie Griceodriguez, Denora Wysocki A, RN Outcome: Progressing   Problem: Elimination: Goal: Will not experience complications related to bowel motility 05/22/2018 1451 by Cordie Griceodriguez, Braylin Formby A, RN Outcome: Adequate for Discharge 05/22/2018 0956 by Cordie Griceodriguez, Shanel Prazak A, RN Outcome: Progressing Goal: Will not experience complications related to urinary retention 05/22/2018 1451 by Cordie Griceodriguez, Lacrecia Delval A, RN Outcome: Adequate for Discharge 05/22/2018 0956 by Cordie Griceodriguez, Juliani Laduke A, RN Outcome: Progressing   Problem: Pain Managment: Goal: General experience of comfort will improve 05/22/2018 1451 by Cordie Griceodriguez, Jareli Highland A, RN Outcome: Adequate for Discharge 05/22/2018 0956 by Cordie Griceodriguez, Sary Bogie A, RN Outcome: Progressing   Problem: Safety: Goal: Ability to remain free from injury will improve 05/22/2018 1451 by Cordie Griceodriguez, Venkat Ankney A, RN Outcome: Adequate for Discharge 05/22/2018 0956 by Cordie Griceodriguez, Kindra Bickham A, RN Outcome: Progressing   Problem: Skin Integrity: Goal: Risk for impaired skin integrity will decrease 05/22/2018 1451 by Cordie Griceodriguez, Tana Trefry A, RN Outcome: Adequate for Discharge 05/22/2018 0956 by Cordie Griceodriguez, Knut Rondinelli A, RN Outcome: Progressing   Problem: Education: Goal: Required Educational Video(s) 05/22/2018 1451 by Cordie Griceodriguez, Oshua Mcconaha A, RN Outcome: Adequate for Discharge 05/22/2018 0956 by Cordie Griceodriguez, Nelda Luckey A, RN Outcome: Progressing   Problem: Clinical Measurements: Goal: Postoperative complications will be avoided or minimized 05/22/2018 1451 by Cordie Griceodriguez, Derrica Sieg A, RN Outcome: Adequate for Discharge 05/22/2018 0956 by Cordie Griceodriguez, Jordynn Marcella A, RN Outcome: Progressing   Problem: Skin Integrity: Goal: Demonstration of wound healing without infection will improve 05/22/2018 1451 by  Cordie Griceodriguez, Siria Calandro A, RN Outcome: Adequate for Discharge 05/22/2018 0956 by Cordie Griceodriguez, Nozomi Mettler A, RN Outcome: Progressing

## 2018-05-22 NOTE — Discharge Summary (Signed)
Physician Discharge Summary  Patient ID: Eloise HarmanHden Dann MRN: [id number] DOB/AGE: May 10, 1941 77 y.o.  PCP: Dorothyann PengSanders, Robyn, MD  Admit date: 05/21/2018 Discharge date: 05/22/2018  Admission Diagnoses:  Symptomatic cholecystitis  Discharge Diagnoses:  same  Active Problems:   S/P laparoscopic cholecystectomy   Surgery:  Laparoscopic cholecystectomy  Discharged Condition: improved  Hospital Course:   Had surgery on Monday.  Kept overnight and doing well  Ready for discharge on Tuesday.   Consults: none  Significant Diagnostic Studies: none    Discharge Exam: Blood pressure (!) 111/57, pulse 67, temperature 98.3 F (36.8 C), temperature source Oral, resp. rate 16, SpO2 100 %. Incisions ok  Disposition: Discharge disposition: 01-Home or Self Care       Discharge Instructions    Diet - low sodium heart healthy   Complete by:  As directed    Increase activity slowly   Complete by:  As directed      Allergies as of 05/22/2018      Reactions   Contrast Media [iodinated Diagnostic Agents] Other (See Comments)   Patient breaks out in hives pt has had ct scans in the past with premeds and has not had reaction.   Other Nausea And Vomiting, Rash, Other (See Comments)   Seafood allergy      Medication List    TAKE these medications   albuterol 108 (90 Base) MCG/ACT inhaler Commonly known as:  PROVENTIL HFA;VENTOLIN HFA Inhale 2 puffs into the lungs every 6 (six) hours as needed for wheezing or shortness of breath.   budesonide-formoterol 160-4.5 MCG/ACT inhaler Commonly known as:  SYMBICORT Inhale 2 puffs into the lungs 2 (two) times daily.   EPINEPHrine 0.3 mg/0.3 mL Soaj injection Commonly known as:  EPI-PEN Use as directed for severe allergic reaction   fluticasone 50 MCG/ACT nasal spray Commonly known as:  FLONASE Place 1 spray into both nostrils daily.   HYDROcodone-acetaminophen 5-325 MG tablet Commonly known as:  NORCO/VICODIN Take 1 tablet by mouth every  6 (six) hours as needed for moderate pain.   imipramine 50 MG tablet Commonly known as:  TOFRANIL Take 50 mg by mouth at bedtime.   omeprazole 20 MG capsule Commonly known as:  PRILOSEC Take 20 mg by mouth daily.   pravastatin 20 MG tablet Commonly known as:  PRAVACHOL Take 20 mg by mouth daily.      Follow-up Information    Luretha MurphyMartin, Tessah Patchen, MD. Schedule an appointment as soon as possible for a visit in 3 weeks.   Specialty:  General Surgery Contact information: [address] [place] [address] [phone]           Signed: Valarie MerinoMatthew B Cedrica Brune 05/22/2018, 12:25 PM

## 2018-05-27 ENCOUNTER — Other Ambulatory Visit: Payer: Self-pay | Admitting: Internal Medicine

## 2018-07-17 ENCOUNTER — Ambulatory Visit: Payer: Medicare Other | Admitting: Allergy and Immunology

## 2018-07-17 ENCOUNTER — Other Ambulatory Visit: Payer: Self-pay | Admitting: Allergy and Immunology

## 2018-07-17 ENCOUNTER — Encounter: Payer: Self-pay | Admitting: Allergy and Immunology

## 2018-07-17 VITALS — BP 106/60 | HR 64 | Resp 18 | Ht 59.0 in | Wt 119.0 lb

## 2018-07-17 DIAGNOSIS — K219 Gastro-esophageal reflux disease without esophagitis: Secondary | ICD-10-CM | POA: Diagnosis not present

## 2018-07-17 DIAGNOSIS — Z91013 Allergy to seafood: Secondary | ICD-10-CM

## 2018-07-17 DIAGNOSIS — J3089 Other allergic rhinitis: Secondary | ICD-10-CM

## 2018-07-17 DIAGNOSIS — J4550 Severe persistent asthma, uncomplicated: Secondary | ICD-10-CM

## 2018-07-17 MED ORDER — OMEPRAZOLE 40 MG PO CPDR: 40.0000 mg | DELAYED_RELEASE_CAPSULE | Freq: Two times a day (BID) | ORAL | 5 refills | Status: DC

## 2018-07-17 NOTE — Patient Instructions (Addendum)
  1.  Continue to perform Allergen avoidance measures - No seafood  2.  Continue to Treat and prevent inflammation:   A.  Symbicort 160 - 2 inhalations twice a day with spacer  B.  Flonase - 1 spray each nostril one time per day  3.  Continue to Treat and prevent reflux:   A.  INCREASE omeprazole 40mg  2 times per day  4.  If needed:   A.  Proventil HFA or similar 2 puffs every 4-6 hours  B.  Nasal saline  C.  Epi-Pen  5. Return to clinic in 12 weeks or earlier if problem

## 2018-07-17 NOTE — Progress Notes (Signed)
Follow-up Note  Referring Provider: Dorothyann Peng, MD Primary Provider: Dorothyann Peng, MD Date of Office Visit: 07/17/2018  Subjective:   Samantha [name] (DOB: [birth year]) is a 78 y.o. female who [facility] in re-evaluation of the following:  HPI: [name] returns to this clinic in evaluation of her severe asthma and allergic rhinitis and LPR and food allergy directed against shellfish.  Her last visit to this clinic was on 24 April 2018.  She arrives today with a Nurse, learning disability.  Apparently she has been doing very well regarding her asthma.  Rarely does use a short acting bronchodilator although it should be noted that she has coupled Symbicort with her pro-air and she has been using these medications on a consistent basis twice a day.  She has not required a systemic steroid or antibiotics since I have seen her in this clinic to treat her respiratory tract issue.  She still has a spot in her throat and drainage in her throat and a coating in her throat.  She did visit with ENT in December 2019 and apparently had rhinoscopy performed which identified a problem with her vocal cord and it was recommended that she start omeprazole 20 mg daily.  However, it should be noted that she was using Nexium prior to that visit.  She is not consuming any caffeine at this point in time.  She is had very little issues with her nose.  She did obtain the flu vaccine this year.  Allergies as of 07/17/2018      Reactions   Contrast Media [iodinated Diagnostic Agents] Other (See Comments)   Patient breaks out in hives pt has had ct scans in the past with premeds and has not had reaction.   Other Nausea And Vomiting, Rash, Other (See Comments)   Seafood allergy      Medication List      albuterol 108 (90 Base) MCG/ACT inhaler Commonly known as:  PROVENTIL HFA;VENTOLIN HFA Inhale 2 puffs into the lungs every 6 (six) hours as needed for wheezing or shortness of  breath.   budesonide-formoterol 160-4.5 MCG/ACT inhaler Commonly known as:  SYMBICORT Inhale 2 puffs into the lungs 2 (two) times daily.   EPINEPHrine 0.3 mg/0.3 mL Soaj injection Commonly known as:  EPI-PEN Use as directed for severe allergic reaction   fluticasone 50 MCG/ACT nasal spray Commonly known as:  FLONASE Place 1 spray into both nostrils daily.   HYDROcodone-acetaminophen 5-325 MG tablet Commonly known as:  NORCO/VICODIN Take 1 tablet by mouth every 6 (six) hours as needed for moderate pain.   imipramine 50 MG tablet Commonly known as:  TOFRANIL Take 50 mg by mouth at bedtime.   omeprazole 20 MG capsule Commonly known as:  PRILOSEC Take 20 mg by mouth daily.   pravastatin 20 MG tablet Commonly known as:  PRAVACHOL TAKE 1 TABLET BY ORAL ROUTE EVERY DAY       Past Medical History:  Diagnosis Date  . Asthma   . GERD (gastroesophageal reflux disease)   . H. pylori infection   . Hypercholesteremia   . UTI (lower urinary tract infection)     Past Surgical History:  Procedure Laterality Date  . ABDOMINAL HYSTERECTOMY    . ABDOMINAL SURGERY    . CESAREAN SECTION    . CHOLECYSTECTOMY N/A 05/21/2018   Procedure: LAPAROSCOPIC CHOLECYSTECTOMY WITH ATTEMPTED INTRAOPERATIVE CHOLANGIOGRAM;  Surgeon: Luretha Murphy, MD;  Location: [facility];  Service: General;  Laterality: N/A;  Review of systems negative except as noted in HPI / PMHx or noted below:  Review of Systems  Constitutional: Negative.   HENT: Negative.   Eyes: Negative.   Respiratory: Negative.   Cardiovascular: Negative.   Gastrointestinal: Negative.   Genitourinary: Negative.   Musculoskeletal: Negative.   Skin: Negative.   Neurological: Negative.   Endo/Heme/Allergies: Negative.   Psychiatric/Behavioral: Negative.      Objective:   Vitals:   07/17/18 1047  BP: 106/60  Pulse: 64  Resp: 18  SpO2: 99%   Height: 4\' 11"  (149.9 cm)  Weight: 119 lb (54 kg)   Physical  Exam Constitutional:      Appearance: She is not diaphoretic.  HENT:     Head: Normocephalic.     Right Ear: Tympanic membrane, ear canal and external ear normal.     Left Ear: Tympanic membrane, ear canal and external ear normal.     Nose: Nose normal. No mucosal edema or rhinorrhea.     Mouth/Throat:     Pharynx: Uvula midline. No oropharyngeal exudate.  Eyes:     Conjunctiva/sclera: Conjunctivae normal.  Neck:     Thyroid: No thyromegaly.     Trachea: Trachea normal. No tracheal tenderness or tracheal deviation.  Cardiovascular:     Rate and Rhythm: Normal rate and regular rhythm.     Heart sounds: Normal heart sounds, S1 normal and S2 normal. No murmur.  Pulmonary:     Effort: No respiratory distress.     Breath sounds: Normal breath sounds. No stridor. No wheezing or rales.  Lymphadenopathy:     Head:     Right side of head: No tonsillar adenopathy.     Left side of head: No tonsillar adenopathy.     Cervical: No cervical adenopathy.  Skin:    Findings: No erythema or rash.     Nails: There is no clubbing.   Neurological:     Mental Status: She is alert.     Diagnostics:    Spirometry was performed and demonstrated an FEV1 of 1.23 at 78 % of predicted.  Assessment and Plan:   1. Asthma, severe persistent, well-controlled   2. Other allergic rhinitis   3. LPRD (laryngopharyngeal reflux disease)   4. Seafood allergy     1.  Continue to perform Allergen avoidance measures - No seafood  2.  Continue to Treat and prevent inflammation:   A.  Symbicort 160 - 2 inhalations twice a day with spacer  B.  Flonase - 1 spray each nostril one time per day  3.  Continue to Treat and prevent reflux:   A.  INCREASE omeprazole 40mg  2 times per day  4.  If needed:   A.  Proventil HFA or similar 2 puffs every 4-6 hours  B.  Nasal saline  C.  Epi-Pen  5. Return to clinic in 12 weeks or earlier if problem  Zula appears to be doing relatively well at this point in time  regarding her inflammatory condition of her airway on her current treatment.  I did recommend that she only use Proventil or pro-air or similar only as needed as there is no long-term benefit of using these medications on a regular basis.  As well, she still appears to have some active LPR and I recommended that at least for the next 12 weeks she utilize an increased dose of omeprazole from 20 mg daily to 40 mg twice a day.  She will keep in contact with me noting  her response as she goes through this upcoming springtime season to this plan.  I will see her back in his clinic in 12 weeks or earlier if there is a problem.  Laurette SchimkeEric Kozlow, MD Allergy / Immunology [facility]

## 2018-07-18 ENCOUNTER — Encounter: Payer: Self-pay | Admitting: Allergy and Immunology

## 2018-08-14 ENCOUNTER — Ambulatory Visit: Payer: Medicare Other | Admitting: Internal Medicine

## 2018-08-26 ENCOUNTER — Other Ambulatory Visit: Payer: Self-pay | Admitting: Allergy and Immunology

## 2018-08-26 ENCOUNTER — Other Ambulatory Visit: Payer: Self-pay | Admitting: Internal Medicine

## 2018-08-28 ENCOUNTER — Encounter: Payer: Self-pay | Admitting: Internal Medicine

## 2018-08-28 ENCOUNTER — Ambulatory Visit (INDEPENDENT_AMBULATORY_CARE_PROVIDER_SITE_OTHER): Payer: Medicare Other | Admitting: Internal Medicine

## 2018-08-28 VITALS — BP 118/66 | HR 68 | Temp 97.7°F | Ht 59.0 in | Wt 126.0 lb

## 2018-08-28 DIAGNOSIS — E7800 Pure hypercholesterolemia, unspecified: Secondary | ICD-10-CM

## 2018-08-28 DIAGNOSIS — K219 Gastro-esophageal reflux disease without esophagitis: Secondary | ICD-10-CM | POA: Diagnosis not present

## 2018-08-28 DIAGNOSIS — H04129 Dry eye syndrome of unspecified lacrimal gland: Secondary | ICD-10-CM | POA: Diagnosis not present

## 2018-08-28 DIAGNOSIS — R7309 Other abnormal glucose: Secondary | ICD-10-CM | POA: Diagnosis not present

## 2018-08-28 NOTE — Patient Instructions (Signed)
Dry Eye    Dry eye, also called keratoconjunctivitis sicca, is dryness of the membranes surrounding the eye. It happens when there are not enough healthy, natural tears in the eyes. The eyes must remain moist at all times. A small amount of tears is constantly produced by the tear glands (lacrimal glands). These glands are located under the outside part of the upper eyelids.  Dry eye can happen on its own or be a symptom of several conditions, such as rheumatoid arthritis, lupus, or Sjögren's syndrome. Dry eye may be mild to severe.  What are the causes?  This condition may be caused by:  · Not making enough tears (aqueous tear-deficient dry eyes).  · Tears evaporating from the eyes too quickly (evaporative dry eyes). This is when there is an abnormality in the quality of your tears. This abnormality causes your tears to evaporate so quickly that the eyes cannot be kept moist.  What increases the risk?  You are more likely to develop this condition if you:  · Are a woman, especially if you have gone through menopause.  · Live in a dry climate.  · Live in a dusty or smoky area.  · Take certain medicines, such as:  ? Anti-allergy medicines (antihistamines).  ? Blood pressure medicines (antihypertensives).  ? Birth control pills (oral contraceptives).  ? Laxatives.  ? Tranquilizers.  · Have a history of refractive eye surgery, such as LASIK.  · Have a history of long-term contact lens use.  What are the signs or symptoms?  Symptoms of this condition include:  · Irritation.  · Itchiness.  · Redness.  · Burning.  · Inflammation of the eyelids.  · Feeling as though something is stuck in the eye.  · Light sensitivity.  · Increased sensitivity and discomfort when wearing contact lenses.  · Vision that varies throughout the day.  · Occasional excessive tearing.  How is this diagnosed?  This condition is diagnosed based on your symptoms, your medical history, and an eye exam.  · Your health care provider may look at your eye  using a microscope and may put dyes in your eye to check the health of the surface of your eye.  · You may have tests, such as a test to evaluate your tear production (Schirmer test).  ? During this test, a small strip of special paper is gently pressed into the inner corner of your eye.  ? Your tear production is measured by how much of the paper is moistened by your tears during a set amount of time.  You may be referred to a health care provider who specializes in eyes and eyesight (ophthalmologist).  How is this treated?  Treatment for this condition depends on the severity. Mild cases are often treated at home. To help relieve your symptoms, your health care provider may recommend eye drops, which are also called artificial tears.  · If your condition is severe, treatment may include:  ? Prescription eye drops.  ? Over-the-counter or prescription ointments to moisten your eyes.  ? Minor surgery to place plugs into the tear ducts. This keep tears from draining so that tears can stay on the surface of the eye longer.  ? Medicines to reduce inflammation of the eyelids.  ? Taking an omega-3 fatty acid nutritional supplement.  Follow these instructions at home:  · Take or apply over-the-counter and prescription medicines only as told by your health care provider. This includes eye drops.  · If directed, apply   a warm compress to your eyes to help reduce inflammation. Place a towel over your eyes and gently press the warm compress over your eyes for about 5 minutes, or as long as told by your health care provider.  · Drink plenty of fluids to stay well hydrated.  · If possible, avoid dry, drafty environments.  · Wear sunglasses when outdoors to protect your eyes from the sun and wind.  · Use a humidifier at home to increase moisture in the air.  · Remember to blink often when reading or using the computer for long periods.  · If you wear contact lenses, remove them regularly to give your eyes a break. Always remove  contacts before sleeping.  · Have a yearly eye exam and vision test.  · Keep all follow-up visits as told by your health care provider. This is important.  Contact a health care provider if:  · You have eye pain.  · You have pus-like fluid coming from your eye.  · Your symptoms get worse or do not improve with treatment.  Get help right away if:  · Your vision suddenly changes.  Summary  · Dry eye is dryness of the membranes surrounding the eye.  · Dry eye can happen on its own or be a symptom of several conditions, such as rheumatoid arthritis, lupus, or Sjögren's syndrome.  · This condition is diagnosed based on your symptoms, your medical history, and an eye exam.  · Treatment for this condition depends on the severity. Mild cases are often treated at home. To help relieve your symptoms, your health care provider may recommend eye drops, which are also called artificial tears.  This information is not intended to replace advice given to you by your health care provider. Make sure you discuss any questions you have with your health care provider.  Document Released: 05/07/2004 Document Revised: 12/12/2017 Document Reviewed: 12/12/2017  Elsevier Interactive Patient Education © 2019 [employer]vier Inc[employer]

## 2018-08-28 NOTE — Progress Notes (Signed)
Subjective:     Patient ID: Samantha [name] , female    DOB: 12/19/1940 , 78 y.o.   MRN: [id number]   Chief Complaint  Patient presents with  . Hyperlipidemia    HPI  Hyperlipidemia  This is a chronic problem. The problem is controlled. There are no known factors aggravating her hyperlipidemia. Current antihyperlipidemic treatment includes statins. The current treatment provides moderate improvement of lipids. Risk factors for coronary artery disease include dyslipidemia, post-menopausal and a sedentary lifestyle.  She is accompanied by an interpreter today.    Past Medical History:  Diagnosis Date  . Asthma   . GERD (gastroesophageal reflux disease)   . H. pylori infection   . Hypercholesteremia   . UTI (lower urinary tract infection)      Family History  Problem Relation Age of Onset  . Cancer Mother   . Asthma Father   . Allergic rhinitis Father   . Food Allergy Father   . Urticaria Neg Hx   . Eczema Neg Hx   . Angioedema Neg Hx      Current Outpatient Medications:  .  budesonide-formoterol (SYMBICORT) 160-4.5 MCG/ACT inhaler, INHALE 2 PUFFS INTO THE LUNGS TWICE DAILY, Disp: 10.2 g, Rfl: 5 .  EPINEPHrine 0.3 mg/0.3 mL IJ SOAJ injection, Use as directed for severe allergic reaction, Disp: 2 Device, Rfl: 2 .  fluticasone (FLONASE) 50 MCG/ACT nasal spray, Place 1 spray into both nostrils daily., Disp: 16 g, Rfl: 5 .  omeprazole (PRILOSEC) 40 MG capsule, TAKE 1 CAPSULE(40 MG) BY MOUTH TWICE DAILY, Disp: 30 capsule, Rfl: 5 .  pravastatin (PRAVACHOL) 20 MG tablet, TAKE 1 TABLET BY ORAL ROUTE EVERY DAY, Disp: 90 tablet, Rfl: 0 .  PROAIR HFA 108 (90 Base) MCG/ACT inhaler, INHALE 2 PUFFS INTO THE LUNGS EVERY 6 HOURS AS NEEDED FOR WHEEZING OR SHORTNESS OF BREATH, Disp: 8.5 g, Rfl: 1 .  imipramine (TOFRANIL) 50 MG tablet, Take 50 mg by mouth at bedtime., Disp: , Rfl:    Allergies  Allergen Reactions  . Contrast Media [Iodinated Diagnostic Agents] Other (See Comments)    Patient  breaks out in hives pt has had ct scans in the past with premeds and has not had reaction.  . Other Nausea And Vomiting, Rash and Other (See Comments)    Seafood allergy     Review of Systems  Constitutional: Negative.   HENT:       She c/o dry eye.  She admits that she has trouble tearing at times. Additionally, her eyes itch on occasion. Wants referral to eye doctor. She can't recall who she has seen in the past.   Respiratory: Negative.   Cardiovascular: Negative.   Gastrointestinal: Negative.   Neurological: Negative.   Psychiatric/Behavioral: Negative.      Today's Vitals   08/28/18 1152  BP: 118/66  Pulse: 68  Temp: 97.7 F (36.5 C)  TempSrc: Oral  Weight: 126 lb (57.2 kg)  Height: 4\' 11"  (1.499 m)  PainSc: 0-No pain   Body mass index is 25.45 kg/m.   Objective:  Physical Exam Vitals signs and nursing note reviewed.  Constitutional:      Appearance: Normal appearance.  HENT:     Head: Normocephalic and atraumatic.  Cardiovascular:     Rate and Rhythm: Normal rate and regular rhythm.     Heart sounds: Normal heart sounds.  Pulmonary:     Effort: Pulmonary effort is normal.     Breath sounds: Normal breath sounds.  Skin:  General: Skin is warm.  Neurological:     General: No focal deficit present.     Mental Status: She is alert.  Psychiatric:        Mood and Affect: Mood normal.        Behavior: Behavior normal.         Assessment And Plan:     1. Pure hypercholesterolemia  I will check a non-fasting lipid panel today. She is encouraged to avoid fried foods and to incorporate more exercise into her daily routine. She is encouraged to work up to 30 minutes five days weekly. All questions were answered to her satisfaction.   - Lipid panel  2. Abnormal glucose  HER A1C HAS BEEN ELEVATED IN THE PAST. I WILL CHECK AN A1C, BMET TODAY. SHE WAS ENCOURAGED TO AVOID SUGARY BEVERAGES AND PROCESSED FOODS INCLUDNG BREADS, RICE AND PASTA.  - Hemoglobin  A1c  3. Gastroesophageal reflux disease without esophagitis  Recent Allergy notes reviewed. I reminded patient that her omeprazole dose has been increased to 40mg  twice daily as per Dr. Lucie Leather. She was also taking the 20mg  dosage. I did remove this from her medicine bag. She had one pill left.   4. Dry eye  I will refer her to Ophthalmology as requested.   [name], MD

## 2018-08-29 LAB — LIPID PANEL
CHOLESTEROL TOTAL: 192 mg/dL (ref 100–199)
Chol/HDL Ratio: 3.1 ratio (ref 0.0–4.4)
HDL: 61 mg/dL (ref >39)
LDL Calculated: 101 mg/dL — ABNORMAL HIGH (ref 0–99)
Triglycerides: 148 mg/dL (ref 0–149)
VLDL Cholesterol Cal: 30 mg/dL (ref 5–40)

## 2018-08-29 LAB — HEMOGLOBIN A1C
Est. average glucose Bld gHb Est-mCnc: 114 mg/dL
HEMOGLOBIN A1C: 5.6 % (ref 4.8–5.6)

## 2018-09-27 ENCOUNTER — Ambulatory Visit: Payer: Medicare Other | Admitting: Internal Medicine

## 2018-09-27 ENCOUNTER — Emergency Department (HOSPITAL_COMMUNITY)
Admission: EM | Admit: 2018-09-27 | Discharge: 2018-09-27 | Disposition: A | Discharge status: Home / Self Care | Payer: Medicare Other | Attending: Emergency Medicine | Admitting: Emergency Medicine

## 2018-09-27 ENCOUNTER — Encounter (HOSPITAL_COMMUNITY): Payer: Self-pay

## 2018-09-27 ENCOUNTER — Other Ambulatory Visit: Payer: Self-pay

## 2018-09-27 ENCOUNTER — Telehealth: Payer: Self-pay

## 2018-09-27 DIAGNOSIS — R5383 Other fatigue: Secondary | ICD-10-CM | POA: Diagnosis present

## 2018-09-27 DIAGNOSIS — R059 Cough, unspecified: Secondary | ICD-10-CM

## 2018-09-27 DIAGNOSIS — Z79899 Other long term (current) drug therapy: Secondary | ICD-10-CM | POA: Insufficient documentation

## 2018-09-27 DIAGNOSIS — Z9049 Acquired absence of other specified parts of digestive tract: Secondary | ICD-10-CM | POA: Insufficient documentation

## 2018-09-27 DIAGNOSIS — R05 Cough: Secondary | ICD-10-CM | POA: Insufficient documentation

## 2018-09-27 DIAGNOSIS — R079 Chest pain, unspecified: Secondary | ICD-10-CM | POA: Insufficient documentation

## 2018-09-27 DIAGNOSIS — J45909 Unspecified asthma, uncomplicated: Secondary | ICD-10-CM | POA: Diagnosis not present

## 2018-09-27 NOTE — ED Provider Notes (Signed)
[facility] EMERGENCY DEPARTMENT Provider Note   CSN: [id number] Arrival date & time: 09/27/18  1748    History   Chief Complaint Chief Complaint  Patient presents with  . Chest Pain    1 1/2 week  . Weakness    3x weeks  . Cough    3 days    HPI Noelene Riling is a 78 y.o. female.  She was interviewed through a [place] interpreter, in the room with her.     HPI  She presents for evaluation of multiple problems, with ongoing illness for 3 weeks.  Her primary complaint is general fatigue.  She also had some coughing but has not done much coughing in the last 5 days.  Today she has mild chest soreness which is worse when she leans forward.  She called her PCP and was directed here for further evaluation and treatment.  She does not have any known sick contacts.  No known sick contacts.  There are no other known modifying factors.   Past Medical History:  Diagnosis Date  . Asthma   . GERD (gastroesophageal reflux disease)   . H. pylori infection   . Hypercholesteremia   . UTI (lower urinary tract infection)     Patient Active Problem List   Diagnosis Date Noted  . S/P laparoscopic cholecystectomy 05/21/2018  . Chronic venous insufficiency 05/01/2015  . Varicose veins of lower extremities with complications 05/01/2015  . Colitis 12/19/2012  . Dyslipidemia 12/19/2012  . ASTHMA 02/15/2008  . SHORTNESS OF BREATH (SOB) 02/15/2008  . CHEST PAIN-UNSPECIFIED 02/15/2008    Past Surgical History:  Procedure Laterality Date  . ABDOMINAL HYSTERECTOMY    . ABDOMINAL SURGERY    . CESAREAN SECTION    . CHOLECYSTECTOMY N/A 05/21/2018   Procedure: LAPAROSCOPIC CHOLECYSTECTOMY WITH ATTEMPTED INTRAOPERATIVE CHOLANGIOGRAM;  Surgeon: Luretha Murphy, MD;  Location: [facility];  Service: General;  Laterality: N/A;     OB History   No obstetric history on file.      Home Medications    Prior to Admission medications   Medication Sig Start Date End Date Taking?  Authorizing Provider  budesonide-formoterol (SYMBICORT) 160-4.5 MCG/ACT inhaler INHALE 2 PUFFS INTO THE LUNGS TWICE DAILY 07/17/18   Kozlow, Alvira Philips, MD  EPINEPHrine 0.3 mg/0.3 mL IJ SOAJ injection Use as directed for severe allergic reaction 09/26/17   Kozlow, Alvira Philips, MD  fluticasone (FLONASE) 50 MCG/ACT nasal spray Place 1 spray into both nostrils daily. 04/24/18   Kozlow, Alvira Philips, MD  imipramine (TOFRANIL) 50 MG tablet Take 50 mg by mouth at bedtime.    [provider]  omeprazole (PRILOSEC) 40 MG capsule TAKE 1 CAPSULE(40 MG) BY MOUTH TWICE DAILY 08/27/18   Kozlow, Alvira Philips, MD  pravastatin (PRAVACHOL) 20 MG tablet TAKE 1 TABLET BY ORAL ROUTE EVERY DAY 08/27/18   Dorothyann Peng, MD  PROAIR HFA 108 [phone] Base) MCG/ACT inhaler INHALE 2 PUFFS INTO THE LUNGS EVERY 6 HOURS AS NEEDED FOR WHEEZING OR SHORTNESS OF BREATH 07/17/18   Kozlow, Alvira Philips, MD    Family History Family History  Problem Relation Age of Onset  . Cancer Mother   . Asthma Father   . Allergic rhinitis Father   . Food Allergy Father   . Urticaria Neg Hx   . Eczema Neg Hx   . Angioedema Neg Hx     Social History Social History   Tobacco Use  . Smoking status: Never Smoker  . Smokeless tobacco: Never Used  Substance Use Topics  . Alcohol use: No    Alcohol/week: 0.0 standard drinks  . Drug use: No     Allergies   Contrast media [iodinated diagnostic agents] and Other   Review of Systems Review of Systems  All other systems reviewed and are negative.    Physical Exam Updated Vital Signs BP (!) 153/74 (BP Location: Left Arm)   Pulse 65   Temp 98.2 F (36.8 C)   Resp 16   SpO2 100%   Physical Exam Vitals signs and nursing note reviewed.  Constitutional:      Appearance: She is well-developed.  HENT:     Head: Normocephalic and atraumatic.     Right Ear: External ear normal.     Left Ear: External ear normal.     Nose: No congestion or rhinorrhea.     Mouth/Throat:     Pharynx: No oropharyngeal  exudate or posterior oropharyngeal erythema.  Eyes:     Conjunctiva/sclera: Conjunctivae normal.     Pupils: Pupils are equal, round, and reactive to light.  Neck:     Musculoskeletal: Normal range of motion and neck supple. No muscular tenderness.     Trachea: Phonation normal.  Cardiovascular:     Rate and Rhythm: Normal rate and regular rhythm.  Pulmonary:     Effort: Pulmonary effort is normal. No respiratory distress.     Breath sounds: Normal breath sounds. No stridor.  Chest:     Chest wall: No tenderness.  Abdominal:     General: There is no distension.     Palpations: Abdomen is soft.     Tenderness: There is no abdominal tenderness. There is no guarding.  Musculoskeletal: Normal range of motion.  Skin:    General: Skin is warm and dry.  Neurological:     Mental Status: She is alert and oriented to person, place, and time.     Motor: No abnormal muscle tone.  Psychiatric:        Mood and Affect: Mood normal.        Behavior: Behavior normal.      ED Treatments / Results  Labs (all labs ordered are listed, but only abnormal results are displayed) Labs Reviewed - No data to display  EKG None  Radiology No results found.  Procedures Procedures (including critical care time)  Medications Ordered in ED Medications - No data to display   Initial Impression / Assessment and Plan / ED Course  I have reviewed the triage vital signs and the nursing notes.  Pertinent labs & imaging results that were available during my care of the patient were reviewed by me and considered in my medical decision making (see chart for details).         Patient Vitals for the past 24 hrs:  BP Temp Pulse Resp SpO2  09/27/18 1803 - - - - 100 %  09/27/18 1801 (!) 153/74 - - 17 -  09/27/18 1754 (!) 153/74 98.2 F (36.8 C) 65 16 100 %    6:35 PM Reevaluation with update and discussion. After initial assessment and treatment, an updated evaluation reveals no change in clinical  status.  Findings discussed with the patient, and all questions were answered. Mancel Bale   Medical Decision Making: Acute illness, nonspecific, with a moderate likelihood of Covid-19.  Patient is hemodynamically stable.  She is not showing any compromise of cardiac or respiratory status.  There is no indication for hospitalization, further testing, or treatment at this time.  Low likelihood of influenza.  CRITICAL CARE-no Performed by: Mancel Bale  Nursing Notes Reviewed/ Care Coordinated Applicable Imaging Reviewed Interpretation of Laboratory Data incorporated into ED treatment  The patient appears reasonably screened and/or stabilized for discharge and I doubt any other medical condition or other [facility] requiring further screening, evaluation, or treatment in the ED at this time prior to discharge.  Plan: Home Medications-continue usual; Home Treatments-rest, fluids; return here if the recommended treatment, does not improve the symptoms; Recommended follow up-PCP, prn    Final Clinical Impressions(s) / ED Diagnoses   Final diagnoses:  None    ED Discharge Orders    None       Mancel Bale, MD 09/27/18 1842

## 2018-09-27 NOTE — Telephone Encounter (Signed)
The patient was notified to go to the urgent for her chest pain.

## 2018-09-27 NOTE — ED Triage Notes (Addendum)
Pt reports having weakness for 3 weeks, chest pain for week and a half, cough for 3 days. Subjective fever and chills, currently afebrile. Diarrhea 1x today. Pt does not speak english, language is [place], pt from [place]. Hx of asthma

## 2018-09-27 NOTE — Discharge Instructions (Addendum)
You appear to have an illness such as a virus or possibly COVID-19.  There is no sign of compromise to your heart or lungs at this time.  You should get plenty of rest and drink a lot of fluids.  Use Tylenol every 4 hours if needed for pain or fever.  Use Robitussin-DM for cough.  Stay at home for 14 days and do not associate with anyone, and wash your hands frequently.  Call your doctor if needed for problems.  You can return to the emergency department, if you develop serious symptoms including severe chest pain, respiratory distress, or weakness.     Person Under Monitoring Name: Samantha [name]  Location: [address] [place] [address]   Infection Prevention Recommendations for Individuals Confirmed to have, or Being Evaluated for, 2019 Novel Coronavirus (COVID-19) Infection Who Receive Care at Home  Individuals who are confirmed to have, or are being evaluated for, COVID-19 should follow the prevention steps below until a healthcare provider or local or state health department says they can return to normal activities.  Stay home except to get medical care You should restrict activities outside your home, except for getting medical care. Do not go to work, school, or public areas, and do not use public transportation or taxis.  Call ahead before visiting your doctor Before your medical appointment, call the healthcare provider and tell them that you have, or are being evaluated for, COVID-19 infection. This will help the healthcare providers office take steps to keep other people from getting infected. Ask your healthcare provider to call the local or state health department.  Monitor your symptoms Seek prompt medical attention if your illness is worsening (e.g., difficulty breathing). Before going to your medical appointment, call the healthcare provider and tell them that you have, or are being evaluated for, COVID-19 infection. Ask your healthcare provider to call the local or  state health department.  Wear a facemask You should wear a facemask that covers your nose and mouth when you are in the same room with other people and when you visit a healthcare provider. People who live with or visit you should also wear a facemask while they are in the same room with you.  Separate yourself from other people in your home As much as possible, you should stay in a different room from other people in your home. Also, you should use a separate bathroom, if available.  Avoid sharing household items You should not share dishes, drinking glasses, cups, eating utensils, towels, bedding, or other items with other people in your home. After using these items, you should wash them thoroughly with soap and water.  Cover your coughs and sneezes Cover your mouth and nose with a tissue when you cough or sneeze, or you can cough or sneeze into your sleeve. Throw used tissues in a lined trash can, and immediately wash your hands with soap and water for at least 20 seconds or use an alcohol-based hand rub.  Wash your [employer] your hands often and thoroughly with soap and water for at least 20 seconds. You can use an alcohol-based hand sanitizer if soap and water are not available and if your hands are not visibly dirty. Avoid touching your eyes, nose, and mouth with unwashed hands.   Prevention Steps for Caregivers and Household Members of Individuals Confirmed to have, or Being Evaluated for, COVID-19 Infection Being Cared for in the Home  If you live with, or provide care at home for, a  person confirmed to have, or being evaluated for, COVID-19 infection please follow these guidelines to prevent infection:  Follow healthcare providers instructions Make sure that you understand and can help the patient follow any healthcare provider instructions for all care.  Provide for the patients basic needs You should help the patient with basic needs in the home and provide support  for getting groceries, prescriptions, and other personal needs.  Monitor the patients symptoms If they are getting sicker, call his or her medical provider and tell them that the patient has, or is being evaluated for, COVID-19 infection. This will help the healthcare providers office take steps to keep other people from getting infected. Ask the healthcare provider to call the local or state health department.  Limit the number of people who have contact with the patient If possible, have only one caregiver for the patient. Other household members should stay in another home or place of residence. If this is not possible, they should stay in another room, or be separated from the patient as much as possible. Use a separate bathroom, if available. Restrict visitors who do not have an essential need to be in the home.  Keep older adults, very young children, and other sick people away from the patient Keep older adults, very young children, and those who have compromised immune systems or chronic health conditions away from the patient. This includes people with chronic heart, lung, or kidney conditions, diabetes, and cancer.  Ensure good ventilation Make sure that shared spaces in the home have good air flow, such as from an air conditioner or an opened window, weather permitting.  Wash your hands often Wash your hands often and thoroughly with soap and water for at least 20 seconds. You can use an alcohol based hand sanitizer if soap and water are not available and if your hands are not visibly dirty. Avoid touching your eyes, nose, and mouth with unwashed hands. Use disposable paper towels to dry your hands. If not available, use dedicated cloth towels and replace them when they become wet.  Wear a facemask and gloves Wear a disposable facemask at all times in the room and gloves when you touch or have contact with the patients blood, body fluids, and/or secretions or excretions, such  as sweat, saliva, sputum, nasal mucus, vomit, urine, or feces.  Ensure the mask fits over your nose and mouth tightly, and do not touch it during use. Throw out disposable facemasks and gloves after using them. Do not reuse. Wash your hands immediately after removing your facemask and gloves. If your personal clothing becomes contaminated, carefully remove clothing and launder. Wash your hands after handling contaminated clothing. Place all used disposable facemasks, gloves, and other waste in a lined container before disposing them with other household waste. Remove gloves and wash your hands immediately after handling these items.  Do not share dishes, glasses, or other household items with the patient Avoid sharing household items. You should not share dishes, drinking glasses, cups, eating utensils, towels, bedding, or other items with a patient who is confirmed to have, or being evaluated for, COVID-19 infection. After the person uses these items, you should wash them thoroughly with soap and water.  Wash laundry thoroughly Immediately remove and wash clothes or bedding that have blood, body fluids, and/or secretions or excretions, such as sweat, saliva, sputum, nasal mucus, vomit, urine, or feces, on them. Wear gloves when handling laundry from the patient. Read and follow directions on labels of laundry or  clothing items and detergent. In general, wash and dry with the warmest temperatures recommended on the label.  Clean all areas the individual has used often Clean all touchable surfaces, such as counters, tabletops, doorknobs, bathroom fixtures, toilets, phones, keyboards, tablets, and bedside tables, every day. Also, clean any surfaces that may have blood, body fluids, and/or secretions or excretions on them. Wear gloves when cleaning surfaces the patient has come in contact with. Use a diluted bleach solution (e.g., dilute bleach with 1 part bleach and 10 parts water) or a household  disinfectant with a label that says EPA-registered for coronaviruses. To make a bleach solution at home, add 1 tablespoon of bleach to 1 quart (4 cups) of water. For a larger supply, add  cup of bleach to 1 gallon (16 cups) of water. Read labels of cleaning products and follow recommendations provided on product labels. Labels contain instructions for safe and effective use of the cleaning product including precautions you should take when applying the product, such as wearing gloves or eye protection and making sure you have good ventilation during use of the product. Remove gloves and wash hands immediately after cleaning.  Monitor yourself for signs and symptoms of illness Caregivers and household members are considered close contacts, should monitor their health, and will be asked to limit movement outside of the home to the extent possible. Follow the monitoring steps for close contacts listed on the symptom monitoring form.   ? If you have additional questions, contact your local health department or call the epidemiologist on call at [phone] (available 24/7). ? This guidance is subject to change. For the most up-to-date guidance from [facility], please refer to their website: [email or web address]

## 2018-10-09 ENCOUNTER — Ambulatory Visit: Payer: Medicare Other | Admitting: Allergy and Immunology

## 2018-10-23 ENCOUNTER — Other Ambulatory Visit: Payer: Self-pay

## 2018-10-23 ENCOUNTER — Ambulatory Visit: Payer: Medicare Other | Admitting: Allergy and Immunology

## 2018-10-23 ENCOUNTER — Encounter: Payer: Self-pay | Admitting: Allergy and Immunology

## 2018-10-23 VITALS — BP 112/74 | HR 61 | Resp 16

## 2018-10-23 DIAGNOSIS — J4550 Severe persistent asthma, uncomplicated: Secondary | ICD-10-CM | POA: Diagnosis not present

## 2018-10-23 DIAGNOSIS — Z91013 Allergy to seafood: Secondary | ICD-10-CM | POA: Diagnosis not present

## 2018-10-23 DIAGNOSIS — R0789 Other chest pain: Secondary | ICD-10-CM

## 2018-10-23 DIAGNOSIS — K219 Gastro-esophageal reflux disease without esophagitis: Secondary | ICD-10-CM | POA: Diagnosis not present

## 2018-10-23 DIAGNOSIS — J3089 Other allergic rhinitis: Secondary | ICD-10-CM | POA: Diagnosis not present

## 2018-10-23 NOTE — Progress Notes (Signed)
[facility] [place] [facility] [place] [facility] [place]   Follow-up Note  Referring Provider: Dorothyann Peng, MD Primary Provider: Dorothyann Peng, MD Date of Office Visit: 10/23/2018  Subjective:   Samantha [name] (DOB: [birth year]) is a 78 y.o. female who [facility] in re-evaluation of the following:  HPI: Franci presents to this clinic in evaluation of asthma and allergic rhinitis and LPR and a history of seafood allergy.  I last saw her in this clinic 17 July 2018.  She believes that her breathing is actually doing quite well.  She can exert herself without much problem and her requirement for a short acting bronchodilator is less than 1 time per week.  She has not required a systemic steroid or an antibiotic to treat a respiratory tract issue since her last visit.  She has not been having any problems with her nose.  Her reflux appears to be under very good control at this point in time.  The spot in her throat and drainage in her throat and coating in her throat is much better since she increased her omeprazole to twice a day.  She had an episode of chest pain.  It is a little difficult to exactly discern her complaints as my understanding of her English is somewhat rudimentary and she did not arrive with a Nurse, learning disability.  However, she has a chest tightness in the front of her chest that goes to the back of her chest and does not appear to be exertional and is not associated with any additional respiratory tract symptoms for which she went to the emergency room at the end of March 2020 and was told that there was no problem.  She may have had a slight cough with this episode but it is really the chest pain that she went to have evaluated at the emergency room.  Allergies as of 10/23/2018      Reactions   Contrast Media [iodinated Diagnostic Agents] Other (See Comments)   Patient breaks out in hives pt has had ct scans in the past with premeds and  has not had reaction.   Other Nausea And Vomiting, Rash, Other (See Comments)   Seafood allergy      Medication List      budesonide-formoterol 160-4.5 MCG/ACT inhaler Commonly known as:  SYMBICORT INHALE 2 PUFFS INTO THE LUNGS TWICE DAILY   EPINEPHrine 0.3 mg/0.3 mL Soaj injection Commonly known as:  EPI-PEN Use as directed for severe allergic reaction   fluticasone 50 MCG/ACT nasal spray Commonly known as:  FLONASE Place 1 spray into both nostrils daily.   omeprazole 40 MG capsule Commonly known as:  PRILOSEC TAKE 1 CAPSULE(40 MG) BY MOUTH TWICE DAILY   pravastatin 20 MG tablet Commonly known as:  PRAVACHOL TAKE 1 TABLET BY ORAL ROUTE EVERY DAY   ProAir HFA 108 (90 Base) MCG/ACT inhaler Generic drug:  albuterol INHALE 2 PUFFS INTO THE LUNGS EVERY 6 HOURS AS NEEDED FOR WHEEZING OR SHORTNESS OF BREATH       Past Medical History:  Diagnosis Date  . Asthma   . GERD (gastroesophageal reflux disease)   . H. pylori infection   . Hypercholesteremia   . UTI (lower urinary tract infection)     Past Surgical History:  Procedure Laterality Date  . ABDOMINAL HYSTERECTOMY    . ABDOMINAL SURGERY    . CESAREAN SECTION    . CHOLECYSTECTOMY N/A 05/21/2018   Procedure: LAPAROSCOPIC CHOLECYSTECTOMY WITH ATTEMPTED  INTRAOPERATIVE CHOLANGIOGRAM;  Surgeon: Luretha MurphyMartin, Matthew, MD;  Location: [facility];  Service: General;  Laterality: N/A;    Review of systems negative except as noted in HPI / PMHx or noted below:  Review of Systems  Constitutional: Negative.   HENT: Negative.   Eyes: Negative.   Respiratory: Negative.   Cardiovascular: Negative.   Gastrointestinal: Negative.   Genitourinary: Negative.   Musculoskeletal: Negative.   Skin: Negative.   Neurological: Negative.   Endo/Heme/Allergies: Negative.   Psychiatric/Behavioral: Negative.      Objective:   Vitals:   10/23/18 1050  BP: 112/74  Pulse: 61  Resp: 16  SpO2: 98%          Physical Exam  Constitutional:      Appearance: She is not diaphoretic.  HENT:     Head: Normocephalic.     Right Ear: Tympanic membrane, ear canal and external ear normal.     Left Ear: Tympanic membrane, ear canal and external ear normal.     Nose: Nose normal. No mucosal edema or rhinorrhea.     Mouth/Throat:     Pharynx: Uvula midline. No oropharyngeal exudate.  Eyes:     Conjunctiva/sclera: Conjunctivae normal.  Neck:     Thyroid: No thyromegaly.     Trachea: Trachea normal. No tracheal tenderness or tracheal deviation.  Cardiovascular:     Rate and Rhythm: Normal rate and regular rhythm.     Heart sounds: Normal heart sounds, S1 normal and S2 normal. No murmur.  Pulmonary:     Effort: No respiratory distress.     Breath sounds: Normal breath sounds. No stridor. No wheezing or rales.  Lymphadenopathy:     Head:     Right side of head: No tonsillar adenopathy.     Left side of head: No tonsillar adenopathy.     Cervical: No cervical adenopathy.  Skin:    Findings: No erythema or rash.     Nails: There is no clubbing.   Neurological:     Mental Status: She is alert.     Diagnostics:    Spirometry was performed and demonstrated an FEV1 of 1.18    Assessment and Plan:   1. Asthma, severe persistent, well-controlled   2. Other allergic rhinitis   3. LPRD (laryngopharyngeal reflux disease)   4. Seafood allergy   5. Other chest pain     1.  Continue to perform Allergen avoidance measures - No seafood  2.  Continue to Treat and prevent inflammation:   A.  Symbicort 160 - 2 inhalations twice a day with spacer  B.  Flonase - 1 spray each nostril one time per day  3.  Continue to Treat and prevent reflux:   A.  omeprazole 40mg  2 times per day  4.  If needed:   A.  Proventil HFA or similar 2 puffs every 4-6 hours  B.  Nasal saline  C.  Epi-Pen  5. Have evaluation with cardiologist for chest pain evaluated in ER  6. Return to clinic in 12 weeks or earlier if problem   Taetum appears to be doing okay from a respiratory standpoint and she will remain on anti-inflammatory agents for both her upper and lower airway and continue to treat reflux with a proton pump inhibitor twice a day.  I think she needs evaluation with a cardiologist for her chest pain.  I have asked her to make sure she brings a Nurse, learning disabilitytranslator to that cardiologist appointment.  At the least she will need  a stress test performed to exclude clinically significant coronary artery disease especially given the fact that previous chest x-rays have identified atherosclerosis.  I will see her back in this clinic in the summer 2020 or earlier if there is a problem.  [name], MD Allergy / Immunology [facility]

## 2018-10-23 NOTE — Patient Instructions (Addendum)
  1.  Continue to perform Allergen avoidance measures - No seafood  2.  Continue to Treat and prevent inflammation:   A.  Symbicort 160 - 2 inhalations twice a day with spacer  B.  Flonase - 1 spray each nostril one time per day  3.  Continue to Treat and prevent reflux:   A.  omeprazole 40mg  2 times per day  4.  If needed:   A.  Proventil HFA or similar 2 puffs every 4-6 hours  B.  Nasal saline  C.  Epi-Pen  5. Have evaluation with cardiologist for chest pain evaluated in ER  6. Return to clinic in 12 weeks or earlier if problem

## 2018-10-24 ENCOUNTER — Encounter: Payer: Self-pay | Admitting: Allergy and Immunology

## 2018-10-25 NOTE — Addendum Note (Signed)
Addended by: Mliss Fritz I on: 10/25/2018 07:52 AM   Modules accepted: Orders

## 2018-11-19 ENCOUNTER — Other Ambulatory Visit: Payer: Self-pay | Admitting: Internal Medicine

## 2019-01-22 ENCOUNTER — Other Ambulatory Visit: Payer: Self-pay | Admitting: Internal Medicine

## 2019-01-22 ENCOUNTER — Encounter: Payer: Self-pay | Admitting: Allergy and Immunology

## 2019-01-22 ENCOUNTER — Other Ambulatory Visit: Payer: Self-pay

## 2019-01-22 ENCOUNTER — Ambulatory Visit: Payer: Medicare Other | Admitting: Allergy and Immunology

## 2019-01-22 VITALS — BP 122/68 | HR 62 | Temp 97.7°F | Resp 16 | Ht 58.66 in | Wt 123.0 lb

## 2019-01-22 DIAGNOSIS — K219 Gastro-esophageal reflux disease without esophagitis: Secondary | ICD-10-CM | POA: Diagnosis not present

## 2019-01-22 DIAGNOSIS — J4550 Severe persistent asthma, uncomplicated: Secondary | ICD-10-CM

## 2019-01-22 DIAGNOSIS — Z91013 Allergy to seafood: Secondary | ICD-10-CM | POA: Diagnosis not present

## 2019-01-22 DIAGNOSIS — J3089 Other allergic rhinitis: Secondary | ICD-10-CM

## 2019-01-22 MED ORDER — BUDESONIDE-FORMOTEROL FUMARATE 160-4.5 MCG/ACT IN AERO: 2.0000 | INHALATION_SPRAY | Freq: Two times a day (BID) | RESPIRATORY_TRACT | 5 refills | Status: DC

## 2019-01-22 MED ORDER — FLUTICASONE PROPIONATE 50 MCG/ACT NA SUSP: 1.0000 | Freq: Every day | NASAL | 5 refills | Status: DC

## 2019-01-22 MED ORDER — OMEPRAZOLE 40 MG PO CPDR: 40.0000 mg | DELAYED_RELEASE_CAPSULE | Freq: Two times a day (BID) | ORAL | 5 refills | Status: DC

## 2019-01-22 MED ORDER — PROAIR HFA 108 (90 BASE) MCG/ACT IN AERS: INHALATION_SPRAY | RESPIRATORY_TRACT | 1 refills | Status: DC

## 2019-01-22 NOTE — Patient Instructions (Signed)
  1.  Continue to perform Allergen avoidance measures - No seafood  2.  Continue to Treat and prevent inflammation:   A.  Symbicort 160 - 2 inhalations twice a day with spacer  B.  Flonase - 1 spray each nostril one time per day  3.  Continue to Treat and prevent reflux:   A.  omeprazole 40mg  2 times per day  4.  If needed:   A.  Proventil HFA or similar 2 puffs every 4-6 hours  B.  Nasal saline  C.  Epi-Pen  5.  Obtain fall flu vaccine (and COVID vaccine)  6. Return to clinic in 6 months or earlier if problem

## 2019-01-22 NOTE — Progress Notes (Signed)
[place]   Follow-up Note  Referring Provider: Glendale Chard, MD Primary Provider: Glendale Chard, MD Date of Office Visit: 01/22/2019  Subjective:   Samantha [name] (DOB: [birth year]) is a 78 y.o. female who [facility] to the [facility] on 01/22/2019 in re-evaluation of the following:  HPI: Samantha [name] presents to this clinic in evaluation of asthma and allergic rhinitis and LPR and history of seafood allergy.  I last saw her in this clinic on 23 October 2018.  She has not been having any problems with her airway while consistently using Symbicort and Flonase.  She has not required a systemic steroid or an antibiotic for any type of airway issue.  She rarely uses a short acting bronchodilator and she can exert herself without any problem.  She still continues to have an occasional spot in her throat with lots of throat clearing.  Sometimes she can have the sensation for an hour or so.  She is only using her omeprazole 1 time per day.  During her last visit she informed me that she had to go the emergency room in evaluation of chest pain and she was scheduled to see a cardiologist but she did not go to see her cardiologist because her chest pain issue had basically resolved.  She remains away from consuming any seafood.  Allergies as of 01/22/2019      Reactions   Contrast Media [iodinated Diagnostic Agents] Other (See Comments)   Patient breaks out in hives pt has had ct scans in the past with premeds and has not had reaction.   Other Nausea And Vomiting, Rash, Other (See Comments)   Seafood allergy      Medication List      budesonide-formoterol 160-4.5 MCG/ACT inhaler Commonly known as: SYMBICORT INHALE 2 PUFFS INTO THE LUNGS TWICE DAILY   EPINEPHrine 0.3 mg/0.3 mL Soaj injection Commonly known as: EPI-PEN Use as directed for severe allergic reaction   fluticasone 50 MCG/ACT nasal spray Commonly known as: FLONASE Place 1  spray into both nostrils daily.   omeprazole 40 MG capsule Commonly known as: PRILOSEC TAKE 1 CAPSULE(40 MG) BY MOUTH TWICE DAILY   pravastatin 20 MG tablet Commonly known as: PRAVACHOL TAKE 1 TABLET BY MOUTH EVERY DAY   ProAir HFA 108 (90 Base) MCG/ACT inhaler Generic drug: albuterol INHALE 2 PUFFS INTO THE LUNGS EVERY 6 HOURS AS NEEDED FOR WHEEZING OR SHORTNESS OF BREATH       Past Medical History:  Diagnosis Date  . Asthma   . GERD (gastroesophageal reflux disease)   . H. pylori infection   . Hypercholesteremia   . UTI (lower urinary tract infection)     Past Surgical History:  Procedure Laterality Date  . ABDOMINAL HYSTERECTOMY    . ABDOMINAL SURGERY    . CESAREAN SECTION    . CHOLECYSTECTOMY N/A 05/21/2018   Procedure: LAPAROSCOPIC CHOLECYSTECTOMY WITH ATTEMPTED INTRAOPERATIVE CHOLANGIOGRAM;  Surgeon: Johnathan Hausen, MD;  Location: [facility];  Service: General;  Laterality: N/A;    Review of systems negative except as noted in HPI / PMHx or noted below:  Review of Systems  Constitutional: Negative.   HENT: Negative.   Eyes: Negative.   Respiratory: Negative.   Cardiovascular: Negative.   Gastrointestinal: Negative.   Genitourinary: Negative.   Musculoskeletal: Negative.   Skin: Negative.   Neurological: Negative.   Endo/Heme/Allergies: Negative.   Psychiatric/Behavioral: Negative.      Objective:   Vitals:  01/22/19 1123  BP: 122/68  Pulse: 62  Resp: 16  Temp: 97.7 F (36.5 C)  SpO2: 98%   Height: 4' 10.66" (149 cm)  Weight: 123 lb (55.8 kg)   Physical Exam Constitutional:      Appearance: She is not diaphoretic.  HENT:     Head: Normocephalic.     Right Ear: Tympanic membrane, ear canal and external ear normal.     Left Ear: Tympanic membrane, ear canal and external ear normal.     Nose: Nose normal. No mucosal edema or rhinorrhea.     Mouth/Throat:     Pharynx: Uvula midline. No oropharyngeal exudate.  Eyes:     Conjunctiva/sclera:  Conjunctivae normal.  Neck:     Thyroid: No thyromegaly.     Trachea: Trachea normal. No tracheal tenderness or tracheal deviation.  Cardiovascular:     Rate and Rhythm: Normal rate and regular rhythm.     Heart sounds: Normal heart sounds, S1 normal and S2 normal. No murmur.  Pulmonary:     Effort: No respiratory distress.     Breath sounds: Normal breath sounds. No stridor. No wheezing or rales.  Lymphadenopathy:     Head:     Right side of head: No tonsillar adenopathy.     Left side of head: No tonsillar adenopathy.     Cervical: No cervical adenopathy.  Skin:    Findings: No erythema or rash.     Nails: There is no clubbing.   Neurological:     Mental Status: She is alert.     Diagnostics:    Spirometry was performed and demonstrated an FEV1 of 1.19 at 76 % of predicted.  The patient had an Asthma Control Test with the following results:  .    Assessment and Plan:   1. Asthma, severe persistent, well-controlled   2. Other allergic rhinitis   3. LPRD (laryngopharyngeal reflux disease)   4. Seafood allergy     1.  Continue to perform Allergen avoidance measures - No seafood  2.  Continue to Treat and prevent inflammation:   A.  Symbicort 160 - 2 inhalations twice a day with spacer  B.  Flonase - 1 spray each nostril one time per day  3.  Continue to Treat and prevent reflux:   A.  omeprazole 40mg  2 times per day  4.  If needed:   A.  Proventil HFA or similar 2 puffs every 4-6 hours  B.  Nasal saline  C.  Epi-Pen  5.  Obtain fall flu vaccine (and COVID vaccine)  6. Return to clinic in 6 months or earlier if problem  [name] appears to be doing relatively well with her upper and lower airway disease although some of her LPR still seems active.  I will increase her omeprazole to twice a day and will see what happens over the course of the next month.  I have asked her to contact me regarding this response.  If she does well I will see her back in this clinic in  6 months or earlier if there is a problem.  Laurette SchimkeEric Sinjin Amero, MD Allergy / Immunology [facility]

## 2019-01-23 ENCOUNTER — Encounter: Payer: Self-pay | Admitting: Allergy and Immunology

## 2019-04-05 DIAGNOSIS — K219 Gastro-esophageal reflux disease without esophagitis: Secondary | ICD-10-CM | POA: Diagnosis not present

## 2019-04-05 DIAGNOSIS — R490 Dysphonia: Secondary | ICD-10-CM | POA: Diagnosis not present

## 2019-04-25 ENCOUNTER — Other Ambulatory Visit: Payer: Self-pay

## 2019-04-25 MED ORDER — PRAVASTATIN SODIUM 20 MG PO TABS: 20.0000 mg | ORAL_TABLET | Freq: Every day | ORAL | 0 refills | Status: DC

## 2019-05-23 ENCOUNTER — Ambulatory Visit: Payer: Medicare Other

## 2019-05-23 ENCOUNTER — Encounter: Payer: Medicare Other | Admitting: Internal Medicine

## 2019-05-23 ENCOUNTER — Telehealth: Payer: Self-pay

## 2019-05-23 NOTE — Telephone Encounter (Signed)
This nurse attempted to contact patient due to no show for today's AWV. First called patient's home number. Call just rang continuously. Then called son HJohnny at number noted in chart. Message left in voicemail to call office in order to reschedule.

## 2019-05-28 ENCOUNTER — Other Ambulatory Visit: Payer: Self-pay

## 2019-06-06 ENCOUNTER — Other Ambulatory Visit: Payer: Self-pay

## 2019-06-06 ENCOUNTER — Encounter: Payer: Self-pay | Admitting: Nurse Practitioner

## 2019-06-06 ENCOUNTER — Ambulatory Visit (INDEPENDENT_AMBULATORY_CARE_PROVIDER_SITE_OTHER): Payer: Medicare Other | Admitting: Nurse Practitioner

## 2019-06-06 ENCOUNTER — Ambulatory Visit (INDEPENDENT_AMBULATORY_CARE_PROVIDER_SITE_OTHER): Payer: Medicare Other

## 2019-06-06 ENCOUNTER — Encounter: Payer: Medicare Other | Admitting: Internal Medicine

## 2019-06-06 VITALS — BP 130/78 | HR 64 | Temp 98.1°F | Ht 59.0 in | Wt 121.0 lb

## 2019-06-06 DIAGNOSIS — R7309 Other abnormal glucose: Secondary | ICD-10-CM

## 2019-06-06 DIAGNOSIS — Z0000 Encounter for general adult medical examination without abnormal findings: Secondary | ICD-10-CM

## 2019-06-06 DIAGNOSIS — Z23 Encounter for immunization: Secondary | ICD-10-CM | POA: Diagnosis not present

## 2019-06-06 DIAGNOSIS — R1084 Generalized abdominal pain: Secondary | ICD-10-CM | POA: Diagnosis not present

## 2019-06-06 DIAGNOSIS — E7800 Pure hypercholesterolemia, unspecified: Secondary | ICD-10-CM | POA: Diagnosis not present

## 2019-06-06 DIAGNOSIS — Z1231 Encounter for screening mammogram for malignant neoplasm of breast: Secondary | ICD-10-CM | POA: Diagnosis not present

## 2019-06-06 DIAGNOSIS — K219 Gastro-esophageal reflux disease without esophagitis: Secondary | ICD-10-CM

## 2019-06-06 MED ORDER — PREVNAR 13 IM SUSP: 0.5000 mL | INTRAMUSCULAR | 0 refills | Status: AC

## 2019-06-06 MED ORDER — PRAVASTATIN SODIUM 20 MG PO TABS: 20.0000 mg | ORAL_TABLET | Freq: Every day | ORAL | 1 refills | Status: DC

## 2019-06-06 NOTE — Progress Notes (Signed)
This visit occurred during the SARS-CoV-2 public health emergency.  Safety protocols were in place, including screening questions prior to the visit, additional usage of staff PPE, and extensive cleaning of exam room while observing appropriate contact time as indicated for disinfecting solutions.  Subjective:     Patient ID: Samantha [name] , female    DOB: Sep 30, 1940 , 78 y.o.   MRN: [id number]   Chief Complaint  Patient presents with  . Annual Exam    HPI  Here for HM.  She has an interpreter.  Language - Rhade  She has been to see Dr. Benson Norway in the past but does not want to be seen there due to her experience.    Abdominal Pain This is a recurrent problem. The current episode started more than 1 month ago. The onset quality is gradual. The problem occurs intermittently. Progression since onset: more frequently. The pain is located in the generalized abdominal region. The quality of the pain is aching. Pertinent negatives include no anorexia, constipation, diarrhea or nausea. The pain is aggravated by bowel movement. Treatments tried: pepto bismol. There is no history of abdominal surgery.   The patient states she uses status post hysterectomy for birth control. Last LMP was No LMP recorded. Patient has had a hysterectomy.  Mammogram last done 2017 Negative for: breast discharge, breast lump(s), breast pain and breast self exam.  Pertinent negatives include abnormal bleeding (hematology), anxiety, decreased libido, depression, difficulty falling sleep, dyspareunia, history of infertility, nocturia, sexual dysfunction, sleep disturbances, urinary incontinence, urinary urgency, vaginal discharge and vaginal itching. Diet regular Asian diet with rice. The patient states her exercise level is minimal.      The patient's tobacco use is:  Social History   Tobacco Use  Smoking Status Never Smoker  Smokeless Tobacco Never Used   She has been exposed to passive smoke. The patient's alcohol use is:   Social History   Substance and Sexual Activity  Alcohol Use No  . Alcohol/week: 0.0 standard drinks    Past Medical History:  Diagnosis Date  . Asthma   . GERD (gastroesophageal reflux disease)   . H. pylori infection   . Hypercholesteremia   . UTI (lower urinary tract infection)      Family History  Problem Relation Age of Onset  . Cancer Mother   . Asthma Father   . Allergic rhinitis Father   . Food Allergy Father   . Urticaria Neg Hx   . Eczema Neg Hx   . Angioedema Neg Hx      Current Outpatient Medications:  .  budesonide-formoterol (SYMBICORT) 160-4.5 MCG/ACT inhaler, Inhale 2 puffs into the lungs 2 (two) times daily., Disp: 10.2 g, Rfl: 5 .  EPINEPHrine 0.3 mg/0.3 mL IJ SOAJ injection, Use as directed for severe allergic reaction, Disp: 2 Device, Rfl: 2 .  fluticasone (FLONASE) 50 MCG/ACT nasal spray, Place 1 spray into both nostrils daily., Disp: 16 g, Rfl: 5 .  omeprazole (PRILOSEC) 40 MG capsule, Take 1 capsule (40 mg total) by mouth 2 (two) times a day., Disp: 60 capsule, Rfl: 5 .  pneumococcal 13-valent conjugate vaccine (PREVNAR 13) SUSP injection, Inject 0.5 mLs into the muscle tomorrow at 10 am for 1 dose., Disp: 0.5 mL, Rfl: 0 .  pravastatin (PRAVACHOL) 20 MG tablet, Take 1 tablet (20 mg total) by mouth daily., Disp: 30 tablet, Rfl: 0 .  PROAIR HFA 108 (90 Base) MCG/ACT inhaler, INHALE 2 PUFFS INTO THE LUNGS EVERY 6 HOURS AS NEEDED FOR WHEEZING  OR SHORTNESS OF BREATH (Patient not taking: Reported on 06/06/2019), Disp: 8.5 g, Rfl: 1   Allergies  Allergen Reactions  . Contrast Media [Iodinated Diagnostic Agents] Other (See Comments)    Patient breaks out in hives pt has had ct scans in the past with premeds and has not had reaction.  . Other Nausea And Vomiting, Rash and Other (See Comments)    Seafood allergy     Review of Systems  Constitutional: Negative.   HENT: Negative.   Eyes: Negative.   Respiratory: Negative.   Cardiovascular: Negative.   Negative for chest pain, palpitations and leg swelling.  Gastrointestinal: Positive for abdominal pain (generalized). Negative for anorexia, constipation, diarrhea and nausea.  Endocrine: Negative.   Genitourinary: Negative.   Musculoskeletal: Negative.   Neurological: Negative.   Hematological: Negative.   Psychiatric/Behavioral: Negative.      Today's Vitals   06/06/19 0945  BP: 130/78  Pulse: 64  Temp: 98.1 F (36.7 C)  TempSrc: Oral  Weight: 121 lb (54.9 kg)  Height: '4\' 11"'  (1.499 m)  PainSc: 0-No pain   Body mass index is 24.44 kg/m.   Objective:  Physical Exam Vitals signs reviewed.  Constitutional:      General: She is not in acute distress.    Appearance: Normal appearance. She is well-developed.  HENT:     Head: Normocephalic and atraumatic.     Right Ear: Hearing, tympanic membrane, ear canal and external ear normal.     Left Ear: Hearing, tympanic membrane, ear canal and external ear normal.     Nose: Nose normal.     Mouth/Throat:     Mouth: Mucous membranes are moist.  Eyes:     General: Lids are normal.     Conjunctiva/sclera: Conjunctivae normal.     Pupils: Pupils are equal, round, and reactive to light.     Funduscopic exam:    Right eye: No papilledema.        Left eye: No papilledema.  Neck:     Musculoskeletal: Full passive range of motion without pain, normal range of motion and neck supple.     Thyroid: No thyroid mass.     Vascular: No carotid bruit.  Cardiovascular:     Rate and Rhythm: Normal rate and regular rhythm.     Pulses: Normal pulses.     Heart sounds: Normal heart sounds. No murmur.  Pulmonary:     Effort: Pulmonary effort is normal.     Breath sounds: Normal breath sounds.  Abdominal:     General: Abdomen is flat. Bowel sounds are normal.     Palpations: Abdomen is soft.  Musculoskeletal: Normal range of motion.        General: No swelling.     Right lower leg: No edema.     Left lower leg: No edema.  Skin:    General:  Skin is warm and dry.     Capillary Refill: Capillary refill takes less than 2 seconds.  Neurological:     General: No focal deficit present.     Mental Status: She is alert and oriented to person, place, and time.     Cranial Nerves: No cranial nerve deficit.     Sensory: No sensory deficit.  Psychiatric:        Mood and Affect: Mood normal.        Behavior: Behavior normal.        Thought Content: Thought content normal.        Judgment: Judgment  normal.         Assessment And Plan:     1. Encounter for screening mammogram for malignant neoplasm of breast  Pt instructed on Self Breast Exam.According to ACOG guidelines Women aged 63 and older are recommended to get an annual mammogram. Form completed and given to patient contact the [facility] for appointment scheduing.   Pt encouraged to get annual mammogram - MM Digital Screening; Future  2. Pure hypercholesterolemia  Chronic, controlled  Continue with current medications - pravastatin (PRAVACHOL) 20 MG tablet; Take 1 tablet (20 mg total) by mouth daily.  Dispense: 90 tablet; Refill: 1  3. Abnormal glucose  Chronic, controlled  No current medications  Increase physical activity as tolerated  4. Gastroesophageal reflux disease without esophagitis  Chronic, continue with current medications - Ambulatory referral to Gastroenterology  5. Generalized abdominal pain  No abnormal findings on physical exam  She reports she has been dealing with abdominal pain for quite some time  She would like a referral to GI  I will check for H pylori since she has a history of GERD. - CBC no Diff - CMP14+EGFR - H Pylori, IGM, IGG, IGA AB - Ambulatory referral to Gastroenterology   Minette Brine, FNP    THE PATIENT IS ENCOURAGED TO PRACTICE SOCIAL DISTANCING DUE TO THE COVID-19 PANDEMIC.

## 2019-06-06 NOTE — Patient Instructions (Signed)
Health Maintenance After Age 78 After age 78, you are at a higher risk for certain long-term diseases and infections as well as injuries from falls. Falls are a major cause of broken bones and head injuries in people who are older than age 78. Getting regular preventive care can help to keep you healthy and well. Preventive care includes getting regular testing and making lifestyle changes as recommended by your health care provider. Talk with your health care provider about:  Which screenings and tests you should have. A screening is a test that checks for a disease when you have no symptoms.  A diet and exercise plan that is right for you. What should I know about screenings and tests to prevent falls? Screening and testing are the best ways to find a health problem early. Early diagnosis and treatment give you the best chance of managing medical conditions that are common after age 78. Certain conditions and lifestyle choices may make you more likely to have a fall. Your health care provider may recommend:  Regular vision checks. Poor vision and conditions such as cataracts can make you more likely to have a fall. If you wear glasses, make sure to get your prescription updated if your vision changes.  Medicine review. Work with your health care provider to regularly review all of the medicines you are taking, including over-the-counter medicines. Ask your health care provider about any side effects that may make you more likely to have a fall. Tell your health care provider if any medicines that you take make you feel dizzy or sleepy.  Osteoporosis screening. Osteoporosis is a condition that causes the bones to get weaker. This can make the bones weak and cause them to break more easily.  Blood pressure screening. Blood pressure changes and medicines to control blood pressure can make you feel dizzy.  Strength and balance checks. Your health care provider may recommend certain tests to check your  strength and balance while standing, walking, or changing positions.  Foot health exam. Foot pain and numbness, as well as not wearing proper footwear, can make you more likely to have a fall.  Depression screening. You may be more likely to have a fall if you have a fear of falling, feel emotionally low, or feel unable to do activities that you used to do.  Alcohol use screening. Using too much alcohol can affect your balance and may make you more likely to have a fall. What actions can I take to lower my risk of falls? General instructions  Talk with your health care provider about your risks for falling. Tell your health care provider if: ? You fall. Be sure to tell your health care provider about all falls, even ones that seem minor. ? You feel dizzy, sleepy, or off-balance.  Take over-the-counter and prescription medicines only as told by your health care provider. These include any supplements.  Eat a healthy diet and maintain a healthy weight. A healthy diet includes low-fat dairy products, low-fat (lean) meats, and fiber from whole grains, beans, and lots of fruits and vegetables. Home safety  Remove any tripping hazards, such as rugs, cords, and clutter.  Install safety equipment such as grab bars in bathrooms and safety rails on stairs.  Keep rooms and walkways well-lit. Activity   Follow a regular exercise program to stay fit. This will help you maintain your balance. Ask your health care provider what types of exercise are appropriate for you.  If you need a cane or   walker, use it as recommended by your health care provider.  Wear supportive shoes that have nonskid soles. Lifestyle  Do not drink alcohol if your health care provider tells you not to drink.  If you drink alcohol, limit how much you have: ? 0-1 drink a day for women. ? 0-2 drinks a day for men.  Be aware of how much alcohol is in your drink. In the [place] one drink equals one typical bottle of beer (12  oz), one-half glass of wine (5 oz), or one shot of hard liquor (1 oz).  Do not use any products that contain nicotine or tobacco, such as cigarettes and e-cigarettes. If you need help quitting, ask your health care provider. Summary  Having a healthy lifestyle and getting preventive care can help to protect your health and wellness after age 78.  Screening and testing are the best way to find a health problem early and help you avoid having a fall. Early diagnosis and treatment give you the best chance for managing medical conditions that are more common for people who are older than age 78.  Falls are a major cause of broken bones and head injuries in people who are older than age 78. Take precautions to prevent a fall at home.  Work with your health care provider to learn what changes you can make to improve your health and wellness and to prevent falls. This information is not intended to replace advice given to you by your health care provider. Make sure you discuss any questions you have with your health care provider. Document Released: 05/03/2017 Document Revised: 10/11/2018 Document Reviewed: 05/03/2017 Elsevier Patient Education  2020 Elsevier Inc.  

## 2019-06-06 NOTE — Progress Notes (Signed)
This visit occurred during the SARS-CoV-2 public health emergency.  Safety protocols were in place, including screening questions prior to the visit, additional usage of staff PPE, and extensive cleaning of exam room while observing appropriate contact time as indicated for disinfecting solutions.  Subjective:   Samantha [name] is a 78 y.o. female who presents for Medicare Annual (Subsequent) preventive examination.  Review of Systems:  n/a Cardiac Risk Factors include: advanced age (>6755men, 21>65 women);dyslipidemia;sedentary lifestyle     Objective:     Vitals: BP 130/78 (BP Location: Left Arm, Patient Position: Sitting, Cuff Size: Normal)   Pulse 64   Temp 98.1 F (36.7 C) (Oral)   Ht 4\' 11"  (1.499 m)   Wt 121 lb (54.9 kg)   SpO2 98%   BMI 24.44 kg/m   Body mass index is 24.44 kg/m.  Advanced Directives 06/06/2019 09/27/2018 05/21/2018 05/21/2018 05/18/2018 05/17/2018 05/24/2017  Does Patient Have a Medical Advance Directive? No No Yes Yes No No No  Type of Advance Directive - Web designer- Healthcare Power of Attorney;Living will Healthcare Power of [place];Living will - - -  Does patient want to make changes to medical advance directive? - - - No - Patient declined - - -  Copy of Healthcare Power of Attorney in Chart? - - No - copy requested No - copy requested - - -  Would patient like information on creating a medical advance directive? - Yes (ED - Information included in AVS) No - Patient declined No - Patient declined - No - Patient declined -  Pre-existing out of facility DNR order (yellow form or pink MOST form) - - - - - - -    Tobacco Social History   Tobacco Use  Smoking Status Never Smoker  Smokeless Tobacco Never Used     Counseling given: Not Answered   Clinical Intake:  Pre-visit preparation completed: Yes  Pain : No/denies pain     Nutritional Status: BMI of 19-24  Normal Nutritional Risks: None Diabetes: No  How often do you need to have someone help you when  you read instructions, pamphlets, or other written materials from your doctor or pharmacy?: 1 - Never What is the last grade level you completed in school?: never went to school  Interpreter Needed?: Yes Interpreter Agency: [employer] Interpreter Name: Joaquin BendLek Siu  Information entered by :: NAllen LPN  Past Medical History:  Diagnosis Date  . Asthma   . GERD (gastroesophageal reflux disease)   . H. pylori infection   . Hypercholesteremia   . UTI (lower urinary tract infection)    Past Surgical History:  Procedure Laterality Date  . ABDOMINAL HYSTERECTOMY    . ABDOMINAL SURGERY    . CESAREAN SECTION    . CHOLECYSTECTOMY N/A 05/21/2018   Procedure: LAPAROSCOPIC CHOLECYSTECTOMY WITH ATTEMPTED INTRAOPERATIVE CHOLANGIOGRAM;  Surgeon: Luretha MurphyMartin, Matthew, MD;  Location: [facility];  Service: General;  Laterality: N/A;   Family History  Problem Relation Age of Onset  . Cancer Mother   . Asthma Father   . Allergic rhinitis Father   . Food Allergy Father   . Urticaria Neg Hx   . Eczema Neg Hx   . Angioedema Neg Hx    Social History   Socioeconomic History  . Marital status: Married    Spouse name: Not on file  . Number of children: Not on file  . Years of education: Not on file  . Highest education level: Not on file  Occupational History  . Occupation: retired  Chief Executive Officerocial  Needs  . Financial resource strain: Not hard at all  . Food insecurity    Worry: Never true    Inability: Never true  . Transportation needs    Medical: No    Non-medical: No  Tobacco Use  . Smoking status: Never Smoker  . Smokeless tobacco: Never Used  Substance and Sexual Activity  . Alcohol use: No    Alcohol/week: 0.0 standard drinks  . Drug use: No  . Sexual activity: Not Currently  Lifestyle  . Physical activity    Days per week: 0 days    Minutes per session: 0 min  . Stress: To some extent  Relationships  . Social Herbalist on phone: Not on file    Gets together: Not on file     Attends religious service: Not on file    Active member of club or organization: Not on file    Attends meetings of clubs or organizations: Not on file    Relationship status: Not on file  Other Topics Concern  . Not on file  Social History Narrative  . Not on file    Outpatient Encounter Medications as of 06/06/2019  Medication Sig  . budesonide-formoterol (SYMBICORT) 160-4.5 MCG/ACT inhaler Inhale 2 puffs into the lungs 2 (two) times daily.  Marland Kitchen EPINEPHrine 0.3 mg/0.3 mL IJ SOAJ injection Use as directed for severe allergic reaction  . fluticasone (FLONASE) 50 MCG/ACT nasal spray Place 1 spray into both nostrils daily.  Marland Kitchen omeprazole (PRILOSEC) 40 MG capsule Take 1 capsule (40 mg total) by mouth 2 (two) times a day.  . pravastatin (PRAVACHOL) 20 MG tablet Take 1 tablet (20 mg total) by mouth daily.  . pneumococcal 13-valent conjugate vaccine (PREVNAR 13) SUSP injection Inject 0.5 mLs into the muscle tomorrow at 10 am for 1 dose.  Marland Kitchen PROAIR HFA 108 (90 Base) MCG/ACT inhaler INHALE 2 PUFFS INTO THE LUNGS EVERY 6 HOURS AS NEEDED FOR WHEEZING OR SHORTNESS OF BREATH (Patient not taking: Reported on 06/06/2019)   No facility-administered encounter medications on file as of 06/06/2019.     Activities of Daily Living In your present state of health, do you have any difficulty performing the following activities: 06/06/2019  Hearing? N  Vision? N  Difficulty concentrating or making decisions? N  Walking or climbing stairs? N  Dressing or bathing? N  Doing errands, shopping? N  Preparing Food and eating ? N  Using the Toilet? N  In the past six months, have you accidently leaked urine? N  Do you have problems with loss of bowel control? N  Managing your Medications? N  Managing your Finances? N  Housekeeping or managing your Housekeeping? N  Some recent data might be hidden    Patient Care Team: Glendale Chard, MD as PCP - General (Internal Medicine)    Assessment:   This is a routine  wellness examination for Samantha [name].  Exercise Activities and Dietary recommendations Current Exercise Habits: The patient does not participate in regular exercise at present  Goals    . DIET - INCREASE WATER INTAKE (pt-stated)     Wants to be in good health. Drink plenty of water and eat healthy food.    . Patient Stated     06/06/2019, no goals at this time       Fall Risk Fall Risk  06/06/2019 08/28/2018 05/17/2018  Falls in the past year? 0 0 0  Follow up Falls evaluation completed;Education provided;Falls prevention discussed - -   Is  the patient's home free of loose throw rugs in walkways, pet beds, electrical cords, etc?   yes      Grab bars in the bathroom? no      Handrails on the stairs?   n/a      Adequate lighting?   yes  Timed Get Up and Go performed: n/a  Depression Screen PHQ 2/9 Scores 06/06/2019 08/28/2018 05/17/2018  PHQ - 2 Score 0 0 0  PHQ- 9 Score 0 - -     Cognitive Function     6CIT Screen 05/17/2018  What Year? 0 points  What month? 0 points  What time? 0 points  Count back from 20 0 points  Months in reverse 0 points  Repeat phrase 8 points  Total Score 8    Immunization History  Administered Date(s) Administered  . Pneumococcal-Unspecified 12/19/2005  . Tdap 05/24/2017    Qualifies for Shingles Vaccine? yes  Screening Tests Health Maintenance  Topic Date Due  . PNA vac Low Risk Adult (1 of 2 - PCV13) 06/05/2020 (Originally 12/20/2006)  . TETANUS/TDAP  05/25/2027  . INFLUENZA VACCINE  Completed  . DEXA SCAN  Completed    Cancer Screenings: Lung: Low Dose CT Chest recommended if Age 62-80 years, 30 pack-year currently smoking OR have quit w/in 15years. Patient does not qualify. Breast:  Up to date on Mammogram? No   Up to date of Bone Density/Dexa? Yes Colorectal: not required  Additional Screenings: : Hepatitis C Screening: n/a     Plan:    Patient has no goals set at this time. Patient cognition seems normal per observation.   6  CIT not administered. I have personally reviewed and noted the following in the patient's chart:   . Medical and social history . Use of alcohol, tobacco or illicit drugs  . Current medications and supplements . Functional ability and status . Nutritional status . Physical activity . Advanced directives . List of other physicians . Hospitalizations, surgeries, and ER visits in previous 12 months . Vitals . Screenings to include cognitive, depression, and falls . Referrals and appointments  In addition, I have reviewed and discussed with patient certain preventive protocols, quality metrics, and best practice recommendations. A written personalized care plan for preventive services as well as general preventive health recommendations were provided to patient.     Barb Merino, LPN  [id number]/01/[id number]

## 2019-06-06 NOTE — Patient Instructions (Signed)
Samantha [name] , Thank you for taking time to come for your Medicare Wellness Visit. I appreciate your ongoing commitment to your health goals. Please review the following plan we discussed and let me know if I can assist you in the future.   Screening recommendations/referrals: Colonoscopy: not required Mammogram: declines Bone Density: 05/2016 Recommended yearly ophthalmology/optometry visit for glaucoma screening and checkup Recommended yearly dental visit for hygiene and checkup  Vaccinations: Influenza vaccine: 04/2019 Pneumococcal vaccine: sent to pharmacy Tdap vaccine: 05/2017 Shingles vaccine: discussed    Advanced directives: Advance directive discussed with you today. Even though you declined this today please call our office should you change your mind and we can give you the proper paperwork for you to fill out.   Conditions/risks identified: hyperlipidemia  Next appointment: 06/11/2020 at 2:00   Preventive Care 14 Years and Older, Female Preventive care refers to lifestyle choices and visits with your health care provider that can promote health and wellness. What does preventive care include?  A yearly physical exam. This is also called an annual well check.  Dental exams once or twice a year.  Routine eye exams. Ask your health care provider how often you should have your eyes checked.  Personal lifestyle choices, including:  Daily care of your teeth and gums.  Regular physical activity.  Eating a healthy diet.  Avoiding tobacco and drug use.  Limiting alcohol use.  Practicing safe sex.  Taking low-dose aspirin every day.  Taking vitamin and mineral supplements as recommended by your health care provider. What happens during an annual well check? The services and screenings done by your health care provider during your annual well check will depend on your age, overall health, lifestyle risk factors, and family history of disease. Counseling  Your health  care provider may ask you questions about your:  Alcohol use.  Tobacco use.  Drug use.  Emotional well-being.  Home and relationship well-being.  Sexual activity.  Eating habits.  History of falls.  Memory and ability to understand (cognition).  Work and work Statistician.  Reproductive health. Screening  You may have the following tests or measurements:  Height, weight, and BMI.  Blood pressure.  Lipid and cholesterol levels. These may be checked every 5 years, or more frequently if you are over 63 years old.  Skin check.  Lung cancer screening. You may have this screening every year starting at age 80 if you have a 30-pack-year history of smoking and currently smoke or have quit within the past 15 years.  Fecal occult blood test (FOBT) of the stool. You may have this test every year starting at age 59.  Flexible sigmoidoscopy or colonoscopy. You may have a sigmoidoscopy every 5 years or a colonoscopy every 10 years starting at age 90.  Hepatitis C blood test.  Hepatitis B blood test.  Sexually transmitted disease (STD) testing.  Diabetes screening. This is done by checking your blood sugar (glucose) after you have not eaten for a while (fasting). You may have this done every 1-3 years.  Bone density scan. This is done to screen for osteoporosis. You may have this done starting at age 50.  Mammogram. This may be done every 1-2 years. Talk to your health care provider about how often you should have regular mammograms. Talk with your health care provider about your test results, treatment options, and if necessary, the need for more tests. Vaccines  Your health care provider may recommend certain vaccines, such as:  Influenza vaccine. This  is recommended every year.  Tetanus, diphtheria, and acellular pertussis (Tdap, Td) vaccine. You may need a Td booster every 10 years.  Zoster vaccine. You may need this after age 37.  Pneumococcal 13-valent conjugate  (PCV13) vaccine. One dose is recommended after age 64.  Pneumococcal polysaccharide (PPSV23) vaccine. One dose is recommended after age 32. Talk to your health care provider about which screenings and vaccines you need and how often you need them. This information is not intended to replace advice given to you by your health care provider. Make sure you discuss any questions you have with your health care provider. Document Released: 07/17/2015 Document Revised: 03/09/2016 Document Reviewed: 04/21/2015 Elsevier Interactive Patient Education  2017 [place] Prevention in the Home Falls can cause injuries. They can happen to people of all ages. There are many things you can do to make your home safe and to help prevent falls. What can I do on the outside of my home?  Regularly fix the edges of walkways and driveways and fix any cracks.  Remove anything that might make you trip as you walk through a door, such as a raised step or threshold.  Trim any bushes or trees on the path to your home.  Use bright outdoor lighting.  Clear any walking paths of anything that might make someone trip, such as rocks or tools.  Regularly check to see if handrails are loose or broken. Make sure that both sides of any steps have handrails.  Any raised decks and porches should have guardrails on the edges.  Have any leaves, snow, or ice cleared regularly.  Use sand or salt on walking paths during winter.  Clean up any spills in your garage right away. This includes oil or grease spills. What can I do in the bathroom?  Use night lights.  Install grab bars by the toilet and in the tub and shower. Do not use towel bars as grab bars.  Use non-skid mats or decals in the tub or shower.  If you need to sit down in the shower, use a plastic, non-slip stool.  Keep the floor dry. Clean up any water that spills on the floor as soon as it happens.  Remove soap buildup in the tub or shower  regularly.  Attach bath mats securely with double-sided non-slip rug tape.  Do not have throw rugs and other things on the floor that can make you trip. What can I do in the bedroom?  Use night lights.  Make sure that you have a light by your bed that is easy to reach.  Do not use any sheets or blankets that are too big for your bed. They should not hang down onto the floor.  Have a firm chair that has side arms. You can use this for support while you get dressed.  Do not have throw rugs and other things on the floor that can make you trip. What can I do in the kitchen?  Clean up any spills right away.  Avoid walking on wet floors.  Keep items that you use a lot in easy-to-reach places.  If you need to reach something above you, use a strong step stool that has a grab bar.  Keep electrical cords out of the way.  Do not use floor polish or wax that makes floors slippery. If you must use wax, use non-skid floor wax.  Do not have throw rugs and other things on the floor that can make you  trip. What can I do with my stairs?  Do not leave any items on the stairs.  Make sure that there are handrails on both sides of the stairs and use them. Fix handrails that are broken or loose. Make sure that handrails are as long as the stairways.  Check any carpeting to make sure that it is firmly attached to the stairs. Fix any carpet that is loose or worn.  Avoid having throw rugs at the top or bottom of the stairs. If you do have throw rugs, attach them to the floor with carpet tape.  Make sure that you have a light switch at the top of the stairs and the bottom of the stairs. If you do not have them, ask someone to add them for you. What else can I do to help prevent falls?  Wear shoes that:  Do not have high heels.  Have rubber bottoms.  Are comfortable and fit you well.  Are closed at the toe. Do not wear sandals.  If you use a stepladder:  Make sure that it is fully  opened. Do not climb a closed stepladder.  Make sure that both sides of the stepladder are locked into place.  Ask someone to hold it for you, if possible.  Clearly mark and make sure that you can see:  Any grab bars or handrails.  First and last steps.  Where the edge of each step is.  Use tools that help you move around (mobility aids) if they are needed. These include:  Canes.  Walkers.  Scooters.  Crutches.  Turn on the lights when you go into a dark area. Replace any light bulbs as soon as they burn out.  Set up your furniture so you have a clear path. Avoid moving your furniture around.  If any of your floors are uneven, fix them.  If there are any pets around you, be aware of where they are.  Review your medicines with your doctor. Some medicines can make you feel dizzy. This can increase your chance of falling. Ask your doctor what other things that you can do to help prevent falls. This information is not intended to replace advice given to you by your health care provider. Make sure you discuss any questions you have with your health care provider. Document Released: 04/16/2009 Document Revised: 11/26/2015 Document Reviewed: 07/25/2014 Elsevier Interactive Patient Education  2017 [employer].

## 2019-06-07 LAB — CMP14+EGFR
ALT: 9 IU/L (ref 0–32)
AST: 17 IU/L (ref 0–40)
Albumin/Globulin Ratio: 1.3 (ref 1.2–2.2)
Albumin: 4.4 g/dL (ref 3.7–4.7)
Alkaline Phosphatase: 91 IU/L (ref 39–117)
BUN/Creatinine Ratio: 15 (ref 12–28)
BUN: 9 mg/dL (ref 8–27)
Bilirubin Total: 0.3 mg/dL (ref 0.0–1.2)
CO2: 26 mmol/L (ref 20–29)
Calcium: 9.2 mg/dL (ref 8.7–10.3)
Chloride: 103 mmol/L (ref 96–106)
Creatinine, Ser: 0.59 mg/dL (ref 0.57–1.00)
GFR calc Af Amer: 102 mL/min/{1.73_m2} (ref >59)
GFR calc non Af Amer: 89 mL/min/{1.73_m2} (ref >59)
Globulin, Total: 3.3 g/dL (ref 1.5–4.5)
Glucose: 94 mg/dL (ref 65–99)
Potassium: 4.5 mmol/L (ref 3.5–5.2)
Sodium: 142 mmol/L (ref 134–144)
Total Protein: 7.7 g/dL (ref 6.0–8.5)

## 2019-06-07 LAB — CBC
Hematocrit: 37.3 % (ref 34.0–46.6)
Hemoglobin: 11.7 g/dL (ref 11.1–15.9)
MCH: 25.8 pg — ABNORMAL LOW (ref 26.6–33.0)
MCHC: 31.4 g/dL — ABNORMAL LOW (ref 31.5–35.7)
MCV: 82 fL (ref 79–97)
Platelets: 262 10*3/uL (ref 150–450)
RBC: 4.54 x10E6/uL (ref 3.77–5.28)
RDW: 14.5 % (ref 11.7–15.4)
WBC: 8.5 10*3/uL (ref 3.4–10.8)

## 2019-06-07 LAB — H PYLORI, IGM, IGG, IGA AB
H pylori, IgM Abs: <9 units (ref 0.0–8.9)
H. pylori, IgA Abs: <9 units (ref 0.0–8.9)
H. pylori, IgG AbS: 2.75 Index Value — ABNORMAL HIGH (ref 0.00–0.79)

## 2019-06-29 NOTE — Progress Notes (Signed)
See if we can get the records and make sure to see if he means Dr. Benson Norway?

## 2019-07-30 ENCOUNTER — Ambulatory Visit: Payer: Medicare Other | Admitting: Allergy and Immunology

## 2019-08-13 ENCOUNTER — Other Ambulatory Visit: Payer: Self-pay | Admitting: Allergy and Immunology

## 2019-09-24 ENCOUNTER — Other Ambulatory Visit: Payer: Self-pay | Admitting: Allergy and Immunology

## 2019-10-08 ENCOUNTER — Ambulatory Visit (INDEPENDENT_AMBULATORY_CARE_PROVIDER_SITE_OTHER): Payer: Medicare Other | Admitting: Allergy and Immunology

## 2019-10-08 ENCOUNTER — Encounter: Payer: Self-pay | Admitting: Allergy and Immunology

## 2019-10-08 VITALS — BP 110/80 | HR 53 | Temp 97.6°F | Resp 18

## 2019-10-08 DIAGNOSIS — Z91013 Allergy to seafood: Secondary | ICD-10-CM | POA: Diagnosis not present

## 2019-10-08 DIAGNOSIS — J4550 Severe persistent asthma, uncomplicated: Secondary | ICD-10-CM

## 2019-10-08 DIAGNOSIS — K219 Gastro-esophageal reflux disease without esophagitis: Secondary | ICD-10-CM | POA: Diagnosis not present

## 2019-10-08 DIAGNOSIS — J3089 Other allergic rhinitis: Secondary | ICD-10-CM

## 2019-10-08 NOTE — Progress Notes (Signed)
[facility] [place] [facility] [place] [facility] [place] - [place]   Follow-up Note  Referring Provider: Dorothyann Peng, MD Primary Provider: Dorothyann Peng, MD Date of Office Visit: 10/08/2019  Subjective:   Samantha [name] (DOB: [birth year]) is a 79 y.o. female who [facility] in re-evaluation of the following:  HPI: Samantha [name] returns to this clinic in evaluation of asthma and allergic rhinitis and LPR and a history of seafood allergy.  Her last visit to this clinic was 22 January 2019.  She informs me that she has not required a systemic steroid or an antibiotic to treat any type of airway issue since her last visit.  She rarely uses a short acting bronchodilator, does not really exercise to any degree, and continues to consistently use her Symbicort twice a day.  Her nose has been doing well while using nasal saline.  She no longer uses a nasal steroid.  Her reflux is 90% controlled and she is satisfied with the response she is received while using a proton pump inhibitor twice a day.  She remains away from consumption of all seafood.  She is very scared about receiving the Covid vaccine.  Allergies as of 10/08/2019      Reactions   Contrast Media [iodinated Diagnostic Agents] Other (See Comments)   Patient breaks out in hives pt has had ct scans in the past with premeds and has not had reaction.   Other Nausea And Vomiting, Rash, Other (See Comments)   Seafood allergy      Medication List    EPINEPHrine 0.3 mg/0.3 mL Soaj injection Commonly known as: EPI-PEN Use as directed for severe allergic reaction   omeprazole 40 MG capsule Commonly known as: PRILOSEC Take 1 capsule (40 mg total) by mouth 2 (two) times a day.   pravastatin 20 MG tablet Commonly known as: PRAVACHOL Take 1 tablet (20 mg total) by mouth daily.   ProAir HFA 108 (90 Base) MCG/ACT inhaler Generic drug: albuterol INHALE 2 PUFFS INTO THE LUNGS EVERY 6 HOURS AS NEEDED FOR  WHEEZING OR SHORTNESS OF BREATH   Symbicort 160-4.5 MCG/ACT inhaler Generic drug: budesonide-formoterol INHALE 2 PUFFS INTO THE LUNGS TWICE DAILY       Past Medical History:  Diagnosis Date  . Asthma   . GERD (gastroesophageal reflux disease)   . H. pylori infection   . Hypercholesteremia   . UTI (lower urinary tract infection)     Past Surgical History:  Procedure Laterality Date  . ABDOMINAL HYSTERECTOMY    . ABDOMINAL SURGERY    . CESAREAN SECTION    . CHOLECYSTECTOMY N/A 05/21/2018   Procedure: LAPAROSCOPIC CHOLECYSTECTOMY WITH ATTEMPTED INTRAOPERATIVE CHOLANGIOGRAM;  Surgeon: Luretha Murphy, MD;  Location: [facility];  Service: General;  Laterality: N/A;    Review of systems negative except as noted in HPI / PMHx or noted below:  Review of Systems  Constitutional: Negative.   HENT: Negative.   Eyes: Negative.   Respiratory: Negative.   Cardiovascular: Negative.   Gastrointestinal: Negative.   Genitourinary: Negative.   Musculoskeletal: Negative.   Skin: Negative.   Neurological: Negative.   Endo/Heme/Allergies: Negative.   Psychiatric/Behavioral: Negative.      Objective:   Vitals:   10/08/19 1031  BP: 110/80  Pulse: (!) 53  Resp: 18  Temp: 97.6 F (36.4 C)  SpO2: 100%          Physical Exam Constitutional:      Appearance: She is not diaphoretic.  HENT:     Head: Normocephalic.     Right Ear: Tympanic membrane, ear canal and external ear normal.     Left Ear: Tympanic membrane, ear canal and external ear normal.     Nose: Nose normal. No mucosal edema or rhinorrhea.     Mouth/Throat:     Pharynx: Uvula midline. No oropharyngeal exudate.  Eyes:     Conjunctiva/sclera: Conjunctivae normal.  Neck:     Thyroid: No thyromegaly.     Trachea: Trachea normal. No tracheal tenderness or tracheal deviation.  Cardiovascular:     Rate and Rhythm: Normal rate and regular rhythm.     Heart sounds: Normal heart sounds, S1 normal and S2 normal. No murmur.   Pulmonary:     Effort: No respiratory distress.     Breath sounds: Normal breath sounds. No stridor. No wheezing or rales.  Lymphadenopathy:     Head:     Right side of head: No tonsillar adenopathy.     Left side of head: No tonsillar adenopathy.     Cervical: No cervical adenopathy.  Skin:    Findings: No erythema or rash.     Nails: There is no clubbing.  Neurological:     Mental Status: She is alert.     Diagnostics:    Spirometry was performed and demonstrated an FEV1 of 1.04 at 67 % of predicted.  Assessment and Plan:   1. Asthma, severe persistent, well-controlled   2. Other allergic rhinitis   3. LPRD (laryngopharyngeal reflux disease)   4. Seafood allergy     1.  Continue to perform Allergen avoidance measures - No seafood  2.  Continue to Treat and prevent inflammation:   A.  Symbicort 160 - 2 inhalations twice a day with spacer  3.  Continue to Treat and prevent reflux:   A.  omeprazole 40mg  2 times per day  4.  If needed:   A.  Proventil HFA or similar 2 puffs every 4-6 hours  B.  Nasal saline  C.  Epi-Pen  5.  Obtain COVID vaccine  6. Return to clinic in 6 months or earlier if problem  Annabella appears to be stable at this point in time regarding her respiratory tract issue and her reflux issue while she is consistently using anti-inflammatory medications for airway and a proton pump inhibitor twice a day.  She will continue to use this form of therapy and I will assume she will do well over the course of the next 6 months at which point in time I will see her back in this clinic or earlier should there be a significant problem.  Allena Katz, MD Allergy / Immunology [place]

## 2019-10-08 NOTE — Patient Instructions (Signed)
  1.  Continue to perform Allergen avoidance measures - No seafood  2.  Continue to Treat and prevent inflammation:   A.  Symbicort 160 - 2 inhalations twice a day with spacer  3.  Continue to Treat and prevent reflux:   A.  omeprazole 40mg  2 times per day  4.  If needed:   A.  Proventil HFA or similar 2 puffs every 4-6 hours  B.  Nasal saline  C.  Epi-Pen  5.  Obtain COVID vaccine  6. Return to clinic in 6 months or earlier if problem

## 2019-10-09 ENCOUNTER — Encounter: Payer: Self-pay | Admitting: Allergy and Immunology

## 2019-10-21 DIAGNOSIS — K219 Gastro-esophageal reflux disease without esophagitis: Secondary | ICD-10-CM | POA: Diagnosis not present

## 2019-10-21 DIAGNOSIS — R070 Pain in throat: Secondary | ICD-10-CM | POA: Diagnosis not present

## 2019-10-23 NOTE — Congregational Nurse Program (Signed)
CN office visit with interpreter Diu Hartshorn assisting.  Helped patient complete Medicaid application. Brantley Fling RN, Congregational Nurse [phone]

## 2019-11-11 ENCOUNTER — Other Ambulatory Visit: Payer: Self-pay | Admitting: Nurse Practitioner

## 2019-11-11 ENCOUNTER — Other Ambulatory Visit: Payer: Self-pay | Admitting: Allergy and Immunology

## 2019-11-11 DIAGNOSIS — E7800 Pure hypercholesterolemia, unspecified: Secondary | ICD-10-CM

## 2019-12-10 ENCOUNTER — Other Ambulatory Visit: Payer: Self-pay

## 2019-12-10 ENCOUNTER — Encounter: Payer: Self-pay | Admitting: Internal Medicine

## 2019-12-10 ENCOUNTER — Ambulatory Visit (INDEPENDENT_AMBULATORY_CARE_PROVIDER_SITE_OTHER): Payer: Medicare Other | Admitting: Internal Medicine

## 2019-12-10 VITALS — BP 140/74 | HR 73 | Temp 98.1°F | Ht 59.8 in | Wt 123.4 lb

## 2019-12-10 DIAGNOSIS — R030 Elevated blood-pressure reading, without diagnosis of hypertension: Secondary | ICD-10-CM

## 2019-12-10 DIAGNOSIS — Z79899 Other long term (current) drug therapy: Secondary | ICD-10-CM

## 2019-12-10 DIAGNOSIS — M5442 Lumbago with sciatica, left side: Secondary | ICD-10-CM | POA: Diagnosis not present

## 2019-12-10 DIAGNOSIS — E559 Vitamin D deficiency, unspecified: Secondary | ICD-10-CM | POA: Diagnosis not present

## 2019-12-10 DIAGNOSIS — E7800 Pure hypercholesterolemia, unspecified: Secondary | ICD-10-CM | POA: Diagnosis not present

## 2019-12-10 NOTE — Patient Instructions (Addendum)
VOLTAREN GEL - APPLY TO LEFT KNEE FRONT, BACK AND SIDES TWO TIMES PER DAY    DASH Eating Plan DASH stands for "Dietary Approaches to Stop Hypertension." The DASH eating plan is a healthy eating plan that has been shown to reduce high blood pressure (hypertension). It may also reduce your risk for type 2 diabetes, heart disease, and stroke. The DASH eating plan may also help with weight loss. What are tips for following this plan?  General guidelines  Avoid eating more than 2,300 mg (milligrams) of salt (sodium) a day. If you have hypertension, you may need to reduce your sodium intake to 1,500 mg a day.  Limit alcohol intake to no more than 1 drink a day for nonpregnant women and 2 drinks a day for men. One drink equals 12 oz of beer, 5 oz of wine, or 1 oz of hard liquor.  Work with your health care provider to maintain a healthy body weight or to lose weight. Ask what an ideal weight is for you.  Get at least 30 minutes of exercise that causes your heart to beat faster (aerobic exercise) most days of the week. Activities may include walking, swimming, or biking.  Work with your health care provider or diet and nutrition specialist (dietitian) to adjust your eating plan to your individual calorie needs. Reading food labels   Check food labels for the amount of sodium per serving. Choose foods with less than 5 percent of the Daily Value of sodium. Generally, foods with less than 300 mg of sodium per serving fit into this eating plan.  To find whole grains, look for the word "whole" as the first word in the ingredient list. Shopping  Buy products labeled as "low-sodium" or "no salt added."  Buy fresh foods. Avoid canned foods and premade or frozen meals. Cooking  Avoid adding salt when cooking. Use salt-free seasonings or herbs instead of table salt or sea salt. Check with your health care provider or pharmacist before using salt substitutes.  Do not fry foods. Cook foods using  healthy methods such as baking, boiling, grilling, and broiling instead.  Cook with heart-healthy oils, such as olive, canola, soybean, or sunflower oil. Meal planning  Eat a balanced diet that includes: ? 5 or more servings of fruits and vegetables each day. At each meal, try to fill half of your plate with fruits and vegetables. ? Up to 6-8 servings of whole grains each day. ? Less than 6 oz of lean meat, poultry, or fish each day. A 3-oz serving of meat is about the same size as a deck of cards. One egg equals 1 oz. ? 2 servings of low-fat dairy each day. ? A serving of nuts, seeds, or beans 5 times each week. ? Heart-healthy fats. Healthy fats called Omega-3 fatty acids are found in foods such as flaxseeds and coldwater fish, like sardines, salmon, and mackerel.  Limit how much you eat of the following: ? Canned or prepackaged foods. ? Food that is high in trans fat, such as fried foods. ? Food that is high in saturated fat, such as fatty meat. ? Sweets, desserts, sugary drinks, and other foods with added sugar. ? Full-fat dairy products.  Do not salt foods before eating.  Try to eat at least 2 vegetarian meals each week.  Eat more home-cooked food and less restaurant, buffet, and fast food.  When eating at a restaurant, ask that your food be prepared with less salt or no salt, if possible.  What foods are recommended? The items listed may not be a complete list. Talk with your dietitian about what dietary choices are best for you. Grains Whole-grain or whole-wheat bread. Whole-grain or whole-wheat pasta. Brown rice. Modena Morrow. Bulgur. Whole-grain and low-sodium cereals. Pita bread. Low-fat, low-sodium crackers. Whole-wheat flour tortillas. Vegetables Fresh or frozen vegetables (raw, steamed, roasted, or grilled). Low-sodium or reduced-sodium tomato and vegetable juice. Low-sodium or reduced-sodium tomato sauce and tomato paste. Low-sodium or reduced-sodium canned  vegetables. Fruits All fresh, dried, or frozen fruit. Canned fruit in natural juice (without added sugar). Meat and other protein foods Skinless chicken or [place]. Ground chicken or [place]. Pork with fat trimmed off. Fish and seafood. Egg whites. Dried beans, peas, or lentils. Unsalted nuts, nut butters, and seeds. Unsalted canned beans. Lean cuts of beef with fat trimmed off. Low-sodium, lean deli meat. Dairy Low-fat (1%) or fat-free (skim) milk. Fat-free, low-fat, or reduced-fat cheeses. Nonfat, low-sodium ricotta or cottage cheese. Low-fat or nonfat yogurt. Low-fat, low-sodium cheese. Fats and oils Soft margarine without trans fats. Vegetable oil. Low-fat, reduced-fat, or light mayonnaise and salad dressings (reduced-sodium). Canola, safflower, olive, soybean, and sunflower oils. Avocado. Seasoning and other foods Herbs. Spices. Seasoning mixes without salt. Unsalted popcorn and pretzels. Fat-free sweets. What foods are not recommended? The items listed may not be a complete list. Talk with your dietitian about what dietary choices are best for you. Grains Baked goods made with fat, such as croissants, muffins, or some breads. Dry pasta or rice meal packs. Vegetables Creamed or fried vegetables. Vegetables in a cheese sauce. Regular canned vegetables (not low-sodium or reduced-sodium). Regular canned tomato sauce and paste (not low-sodium or reduced-sodium). Regular tomato and vegetable juice (not low-sodium or reduced-sodium). Angie Fava. Olives. Fruits Canned fruit in a light or heavy syrup. Fried fruit. Fruit in cream or butter sauce. Meat and other protein foods Fatty cuts of meat. Ribs. Fried meat. Berniece Salines. Sausage. Bologna and other processed lunch meats. Salami. Fatback. Hotdogs. Bratwurst. Salted nuts and seeds. Canned beans with added salt. Canned or smoked fish. Whole eggs or egg yolks. Chicken or [place] with skin. Dairy Whole or 2% milk, cream, and half-and-half. Whole or full-fat  cream cheese. Whole-fat or sweetened yogurt. Full-fat cheese. Nondairy creamers. Whipped toppings. Processed cheese and cheese spreads. Fats and oils Butter. Stick margarine. Lard. Shortening. Ghee. Bacon fat. Tropical oils, such as coconut, palm kernel, or palm oil. Seasoning and other foods Salted popcorn and pretzels. Onion salt, garlic salt, seasoned salt, table salt, and sea salt. Worcestershire sauce. Tartar sauce. Barbecue sauce. Teriyaki sauce. Soy sauce, including reduced-sodium. Steak sauce. Canned and packaged gravies. Fish sauce. Oyster sauce. Cocktail sauce. Horseradish that you find on the shelf. Ketchup. Mustard. Meat flavorings and tenderizers. Bouillon cubes. Hot sauce and Tabasco sauce. Premade or packaged marinades. Premade or packaged taco seasonings. Relishes. Regular salad dressings. Where to find more information:  National Heart, Lung, and [place]: https://wilson-eaton.com/  American Heart Association: www.heart.org Summary  The DASH eating plan is a healthy eating plan that has been shown to reduce high blood pressure (hypertension). It may also reduce your risk for type 2 diabetes, heart disease, and stroke.  With the DASH eating plan, you should limit salt (sodium) intake to 2,300 mg a day. If you have hypertension, you may need to reduce your sodium intake to 1,500 mg a day.  When on the DASH eating plan, aim to eat more fresh fruits and vegetables, whole grains, lean proteins, low-fat dairy, and heart-healthy fats.  Work  with your health care provider or diet and nutrition specialist (dietitian) to adjust your eating plan to your individual calorie needs. This information is not intended to replace advice given to you by your health care provider. Make sure you discuss any questions you have with your health care provider. Document Revised: 06/02/2017 Document Reviewed: 06/13/2016 Elsevier Patient Education  2020 [employer].

## 2019-12-10 NOTE — Progress Notes (Signed)
This visit occurred during the SARS-CoV-2 public health emergency.  Safety protocols were in place, including screening questions prior to the visit, additional usage of staff PPE, and extensive cleaning of exam room while observing appropriate contact time as indicated for disinfecting solutions.  Subjective:     Patient ID: Samantha [name] , female    DOB: 12-25-1940 , 79 y.o.   MRN: [id number]   Chief Complaint  Patient presents with  . Leg Pain    HPI  She is here today for further evaluation of b/l leg pain. She denies fall/trauma. She reports her lower legs ache. Also with lower back pain that radiates to lower legs. Denies LE weakness. Has occasional paresthesias.     Past Medical History:  Diagnosis Date  . Asthma   . GERD (gastroesophageal reflux disease)   . H. pylori infection   . Hypercholesteremia   . UTI (lower urinary tract infection)      Family History  Problem Relation Age of Onset  . Cancer Mother   . Asthma Father   . Allergic rhinitis Father   . Food Allergy Father   . Urticaria Neg Hx   . Eczema Neg Hx   . Angioedema Neg Hx      Current Outpatient Medications:  .  EPINEPHrine 0.3 mg/0.3 mL IJ SOAJ injection, Use as directed for severe allergic reaction, Disp: 2 Device, Rfl: 2 .  famotidine (PEPCID) 20 MG tablet, Take 20 mg by mouth 2 (two) times daily., Disp: , Rfl:  .  omeprazole (PRILOSEC) 40 MG capsule, TAKE 1 CAPSULE(40 MG) BY MOUTH TWICE DAILY, Disp: 60 capsule, Rfl: 4 .  pravastatin (PRAVACHOL) 20 MG tablet, TAKE 1 TABLET(20 MG) BY MOUTH DAILY, Disp: 90 tablet, Rfl: 1 .  PROAIR HFA 108 (90 Base) MCG/ACT inhaler, INHALE 2 PUFFS INTO THE LUNGS EVERY 6 HOURS AS NEEDED FOR WHEEZING OR SHORTNESS OF BREATH, Disp: 8.5 g, Rfl: 1 .  SYMBICORT 160-4.5 MCG/ACT inhaler, INHALE 2 PUFFS INTO THE LUNGS TWICE DAILY, Disp: 10.2 g, Rfl: 4   Allergies  Allergen Reactions  . Contrast Media [Iodinated Diagnostic Agents] Other (See Comments)    Patient breaks out in  hives pt has had ct scans in the past with premeds and has not had reaction.  . Other Nausea And Vomiting, Rash and Other (See Comments)    Seafood allergy     Review of Systems  Constitutional: Negative.   Respiratory: Negative.   Cardiovascular: Negative.   Gastrointestinal: Negative.   Musculoskeletal: Positive for back pain.  Neurological: Negative.   Psychiatric/Behavioral: Negative.      Today's Vitals   12/10/19 0931  BP: 140/74  Pulse: 73  Temp: 98.1 F (36.7 C)  TempSrc: Oral  Weight: 123 lb 6.4 oz (56 kg)  Height: 4' 11.8" (1.519 m)   Body mass index is 24.26 kg/m.   Objective:  Physical Exam Vitals and nursing note reviewed.  Constitutional:      Appearance: Normal appearance.  HENT:     Head: Normocephalic and atraumatic.  Cardiovascular:     Rate and Rhythm: Normal rate and regular rhythm.     Heart sounds: Normal heart sounds.  Pulmonary:     Effort: Pulmonary effort is normal.     Breath sounds: Normal breath sounds.  Musculoskeletal:     Comments: Shins tender to palpation. Neg straight leg test There is no spinal tenderness. There is paravertebral tenderness.  Skin:    General: Skin is warm.  Neurological:  General: No focal deficit present.     Mental Status: She is alert.  Psychiatric:        Mood and Affect: Mood normal.        Behavior: Behavior normal.         Assessment And Plan:     1. Acute bilateral low back pain with left-sided sciatica  She is advised to use over the counter lidocaine patches to affected area as needed. She will let me know if her sx persist.   2. Elevated blood pressure reading  Elevation could be due to her acute pain. She is encouraged to avoid adding salt to her foods and to consider cutting back on soy sauce. She agrees to f/u in four to six months for re-evaluation.   - CMP14+EGFR  3. Pure hypercholesterolemia  Chronic. She will continue with pravastatin 34m daily for now. I will check thyroid  function today as well.   - TSH  4. Vitamin D deficiency  I WILL CHECK A VIT D LEVEL AND SUPPLEMENT AS NEEDED.  ALSO ENCOURAGED TO SPEND 15 MINUTES IN THE SUN DAILY.  - Vitamin D (25 hydroxy)  5. Drug therapy  - Hepatitis C antibody   RMaximino Greenland MD    THE PATIENT IS ENCOURAGED TO PRACTICE SOCIAL DISTANCING DUE TO THE COVID-19 PANDEMIC.

## 2019-12-11 LAB — CMP14+EGFR
ALT: 12 IU/L (ref 0–32)
AST: 20 IU/L (ref 0–40)
Albumin/Globulin Ratio: 1.2 (ref 1.2–2.2)
Albumin: 4.1 g/dL (ref 3.7–4.7)
Alkaline Phosphatase: 91 IU/L (ref 48–121)
BUN/Creatinine Ratio: 23 (ref 12–28)
BUN: 14 mg/dL (ref 8–27)
Bilirubin Total: 0.3 mg/dL (ref 0.0–1.2)
CO2: 24 mmol/L (ref 20–29)
Calcium: 9.1 mg/dL (ref 8.7–10.3)
Chloride: 100 mmol/L (ref 96–106)
Creatinine, Ser: 0.6 mg/dL (ref 0.57–1.00)
GFR calc Af Amer: 101 mL/min/{1.73_m2} (ref >59)
GFR calc non Af Amer: 88 mL/min/{1.73_m2} (ref >59)
Globulin, Total: 3.5 g/dL (ref 1.5–4.5)
Glucose: 82 mg/dL (ref 65–99)
Potassium: 4 mmol/L (ref 3.5–5.2)
Sodium: 138 mmol/L (ref 134–144)
Total Protein: 7.6 g/dL (ref 6.0–8.5)

## 2019-12-11 LAB — VITAMIN D 25 HYDROXY (VIT D DEFICIENCY, FRACTURES): Vit D, 25-Hydroxy: 17.9 ng/mL — ABNORMAL LOW (ref 30.0–100.0)

## 2019-12-11 LAB — TSH: TSH: 1.07 u[IU]/mL (ref 0.450–4.500)

## 2019-12-11 LAB — HEPATITIS C ANTIBODY: Hep C Virus Ab: <0.1 s/co ratio (ref 0.0–0.9)

## 2019-12-24 ENCOUNTER — Ambulatory Visit: Payer: Medicare Other

## 2019-12-24 ENCOUNTER — Other Ambulatory Visit: Payer: Self-pay

## 2019-12-24 ENCOUNTER — Other Ambulatory Visit: Payer: Self-pay | Admitting: Internal Medicine

## 2019-12-24 VITALS — BP 122/80 | HR 62 | Temp 98.4°F | Ht <= 58 in | Wt 123.8 lb

## 2019-12-24 DIAGNOSIS — R030 Elevated blood-pressure reading, without diagnosis of hypertension: Secondary | ICD-10-CM

## 2019-12-24 MED ORDER — VITAMIN D (ERGOCALCIFEROL) 1.25 MG (50000 UNIT) PO CAPS: 50000.0000 [IU] | ORAL_CAPSULE | ORAL | 0 refills | Status: DC

## 2019-12-24 NOTE — Progress Notes (Signed)
Pt presents today for b/p check 122/80 Also sent in pt Vitamin D prescription that was mentioned in last note

## 2020-01-07 ENCOUNTER — Ambulatory Visit: Payer: Medicare Other | Admitting: Allergy and Immunology

## 2020-01-07 ENCOUNTER — Encounter: Payer: Self-pay | Admitting: Allergy and Immunology

## 2020-01-07 ENCOUNTER — Other Ambulatory Visit: Payer: Self-pay

## 2020-01-07 VITALS — BP 124/74 | HR 61 | Temp 98.0°F | Resp 16 | Wt 125.6 lb

## 2020-01-07 DIAGNOSIS — K219 Gastro-esophageal reflux disease without esophagitis: Secondary | ICD-10-CM

## 2020-01-07 DIAGNOSIS — J4550 Severe persistent asthma, uncomplicated: Secondary | ICD-10-CM | POA: Diagnosis not present

## 2020-01-07 DIAGNOSIS — Z91013 Allergy to seafood: Secondary | ICD-10-CM

## 2020-01-07 DIAGNOSIS — J3089 Other allergic rhinitis: Secondary | ICD-10-CM | POA: Diagnosis not present

## 2020-01-07 NOTE — Patient Instructions (Addendum)
  1.  Continue to perform Allergen avoidance measures - No seafood  2.  Continue to Treat and prevent inflammation:   A.  Symbicort 160 - 2 inhalations twice a day with spacer  3.  Continue to Treat and prevent reflux:   A.  omeprazole 40mg  2 times per day   4.  If needed:   A.  Proventil HFA or similar 2 puffs every 4-6 hours  B.  Nasal saline  C.  Epi-Pen  5.  Obtain Fall flu vaccine  6. Return to clinic in 6 months or earlier if problem

## 2020-01-07 NOTE — Progress Notes (Signed)
[place] [facility] [place] [facility] [place] [facility] [place] [facility] [place]   Follow-up Note  Referring Provider: Dorothyann Peng, MD Primary Provider: Dorothyann Peng, MD Date of Office Visit: 01/07/2020  Subjective:   Samantha [name] (DOB: 09/21/1940) is a 79 y.o. female who [facility] in re-evaluation of the following:  HPI: Samantha [name] returns to this clinic in reevaluation of asthma and allergic rhinitis and LPR and a history of seafood allergy.  Her last visit to this clinic was 08 October 2019.  Overall she has done well with her airway.  She can exert herself without any difficulty.  She has not required a systemic steroid to treat an exacerbation of asthma.  She does not use a short acting bronchodilator greater than 1 time per week.  She continues to use her Symbicort twice a day on a consistent basis.  She has had very little problems with her nose at this point in time without any specific therapy.  She believes that her reflux is under very good control.  She has followed up with ENT, Dr. Suszanne Conners, regarding her LPR.  She does not consume shellfish.  She has received 2 Covid vaccinations and apparently developed some issues with myalgia after that vaccine that it is improving with each passing month.  Apparently she has been having an issue with fatigue for quite a long period in time that is being evaluated by her primary care doctor.  Allergies as of 01/07/2020      Reactions   Contrast Media [iodinated Diagnostic Agents] Other (See Comments)   Patient breaks out in hives pt has had ct scans in the past with premeds and has not had reaction.   Other Nausea And Vomiting, Rash, Other (See Comments)   Seafood allergy      Medication List      EPINEPHrine 0.3 mg/0.3 mL Soaj injection Commonly known as: EPI-PEN Use as directed for severe allergic reaction   famotidine 20 MG tablet Commonly known as: PEPCID Take 20 mg by mouth 2 (two) times daily.     omeprazole 40 MG capsule Commonly known as: PRILOSEC TAKE 1 CAPSULE(40 MG) BY MOUTH TWICE DAILY   pravastatin 20 MG tablet Commonly known as: PRAVACHOL TAKE 1 TABLET(20 MG) BY MOUTH DAILY   ProAir HFA 108 (90 Base) MCG/ACT inhaler Generic drug: albuterol INHALE 2 PUFFS INTO THE LUNGS EVERY 6 HOURS AS NEEDED FOR WHEEZING OR SHORTNESS OF BREATH   Symbicort 160-4.5 MCG/ACT inhaler Generic drug: budesonide-formoterol INHALE 2 PUFFS INTO THE LUNGS TWICE DAILY   Vitamin D (Ergocalciferol) 1.25 MG (50000 UNIT) Caps capsule Commonly known as: DRISDOL TAKE 1 CAPSULE BY MOUTH 2 TIMES A WEEK       Past Medical History:  Diagnosis Date  . Asthma   . GERD (gastroesophageal reflux disease)   . H. pylori infection   . Hypercholesteremia   . UTI (lower urinary tract infection)     Past Surgical History:  Procedure Laterality Date  . ABDOMINAL HYSTERECTOMY    . ABDOMINAL SURGERY    . CESAREAN SECTION    . CHOLECYSTECTOMY N/A 05/21/2018   Procedure: LAPAROSCOPIC CHOLECYSTECTOMY WITH ATTEMPTED INTRAOPERATIVE CHOLANGIOGRAM;  Surgeon: Luretha Murphy, MD;  Location: [facility];  Service: General;  Laterality: N/A;    Review of systems negative except as noted in HPI / PMHx or noted below:  Review of Systems  Constitutional: Negative.   HENT: Negative.   Eyes: Negative.   Respiratory: Negative.   Cardiovascular:  Negative.   Gastrointestinal: Negative.   Genitourinary: Negative.   Musculoskeletal: Negative.   Skin: Negative.   Neurological: Negative.   Endo/Heme/Allergies: Negative.   Psychiatric/Behavioral: Negative.      Objective:   Vitals:   01/07/20 0903  BP: 124/74  Pulse: 61  Resp: 16  Temp: 98 F (36.7 C)  SpO2: 98%      Weight: 125 lb 9.6 oz (57 kg)   Physical Exam Constitutional:      Appearance: She is not diaphoretic.  HENT:     Head: Normocephalic.     Right Ear: Tympanic membrane, ear canal and external ear normal.     Left Ear: Tympanic membrane,  ear canal and external ear normal.     Nose: Nose normal. No mucosal edema or rhinorrhea.     Mouth/Throat:     Pharynx: Uvula midline. No oropharyngeal exudate.  Eyes:     Conjunctiva/sclera: Conjunctivae normal.  Neck:     Thyroid: No thyromegaly.     Trachea: Trachea normal. No tracheal tenderness or tracheal deviation.  Cardiovascular:     Rate and Rhythm: Normal rate and regular rhythm.     Heart sounds: Normal heart sounds, S1 normal and S2 normal. No murmur heard.   Pulmonary:     Effort: No respiratory distress.     Breath sounds: Normal breath sounds. No stridor. No wheezing or rales.  Lymphadenopathy:     Head:     Right side of head: No tonsillar adenopathy.     Left side of head: No tonsillar adenopathy.     Cervical: No cervical adenopathy.  Skin:    Findings: No erythema or rash.     Nails: There is no clubbing.  Neurological:     Mental Status: She is alert.     Diagnostics:    Spirometry was performed and demonstrated an FEV1 of 1.11 at 72 % of predicted.  Oxygen saturation at rest on room air was 96%.  Oxygen saturation walking the hallway on room air was 96%.  Assessment and Plan:   1. Asthma, severe persistent, well-controlled   2. Other allergic rhinitis   3. LPRD (laryngopharyngeal reflux disease)   4. Seafood allergy     1.  Continue to perform Allergen avoidance measures - No seafood  2.  Continue to Treat and prevent inflammation:   A.  Symbicort 160 - 2 inhalations twice a day with spacer  3.  Continue to Treat and prevent reflux:   A.  omeprazole 40mg  2 times per day   4.  If needed:   A.  Proventil HFA or similar 2 puffs every 4-6 hours  B.  Nasal saline  C.  Epi-Pen  5.  Obtain Fall flu vaccine  6. Return to clinic in 6 months or earlier if problem  [name] appears to be doing well from a airway standpoint and a reflux standpoint on her current therapy and I would like for her to maintain treatment with anti-inflammatory agents  delivered to her lungs and a proton pump inhibitor utilized on a consistent basis.  Assuming she does well with this plan I will see her back in this clinic in 6 months or earlier if there is a problem.  , MD Allergy / Immunology [facility]

## 2020-01-08 ENCOUNTER — Encounter: Payer: Self-pay | Admitting: Allergy and Immunology

## 2020-01-08 ENCOUNTER — Other Ambulatory Visit: Payer: Self-pay | Admitting: Allergy and Immunology

## 2020-01-21 DIAGNOSIS — K219 Gastro-esophageal reflux disease without esophagitis: Secondary | ICD-10-CM | POA: Diagnosis not present

## 2020-01-21 DIAGNOSIS — R070 Pain in throat: Secondary | ICD-10-CM | POA: Diagnosis not present

## 2020-03-12 ENCOUNTER — Other Ambulatory Visit: Payer: Self-pay

## 2020-03-12 ENCOUNTER — Ambulatory Visit (INDEPENDENT_AMBULATORY_CARE_PROVIDER_SITE_OTHER): Payer: Medicare Other | Admitting: Internal Medicine

## 2020-03-12 ENCOUNTER — Encounter: Payer: Self-pay | Admitting: Internal Medicine

## 2020-03-12 VITALS — BP 116/74 | HR 61 | Temp 98.1°F | Ht <= 58 in | Wt 123.4 lb

## 2020-03-12 DIAGNOSIS — R0789 Other chest pain: Secondary | ICD-10-CM | POA: Diagnosis not present

## 2020-03-12 DIAGNOSIS — K219 Gastro-esophageal reflux disease without esophagitis: Secondary | ICD-10-CM

## 2020-03-12 DIAGNOSIS — M79605 Pain in left leg: Secondary | ICD-10-CM

## 2020-03-12 NOTE — Progress Notes (Signed)
I,Katawbba Wiggins,acting as a Education administrator for Maximino Greenland, MD.,have documented all relevant documentation on the behalf of Maximino Greenland, MD,as directed by  Maximino Greenland, MD while in the presence of Maximino Greenland, MD.  This visit occurred during the SARS-CoV-2 public health emergency.  Safety protocols were in place, including screening questions prior to the visit, additional usage of staff PPE, and extensive cleaning of exam room while observing appropriate contact time as indicated for disinfecting solutions.  Subjective:     Patient ID: Samantha [name] , female    DOB: [birth year] , 79 y.o.   MRN: [id number]   Chief Complaint  Patient presents with  . Leg Pain    left  . Chest Pain    HPI  The patient is here today for evaluation of left leg pain. She is accompanied by her interpreter. She denies fall/trauma. There is pain with ambulation. Feels her sx worsen after standing for long periods of time.   Leg Pain  The incident occurred more than 1 week ago. There was no injury mechanism. The pain is present in the left leg and left knee. The quality of the pain is described as aching and burning. The pain is moderate. Pertinent negatives include no loss of motion or loss of sensation. The symptoms are aggravated by movement.  Chest Pain  This is a recurrent problem. The current episode started in the past 7 days. The onset quality is gradual. The problem has been gradually worsening. The pain is at a severity of 6/10. The pain is moderate. The quality of the pain is described as dull. The pain does not radiate. Associated symptoms include abdominal pain and leg pain. Pertinent negatives include no back pain.     Past Medical History:  Diagnosis Date  . Asthma   . GERD (gastroesophageal reflux disease)   . H. pylori infection   . Hypercholesteremia   . UTI (lower urinary tract infection)      Family History  Problem Relation Age of Onset  . Cancer Mother   . Asthma Father   .  Allergic rhinitis Father   . Food Allergy Father   . Urticaria Neg Hx   . Eczema Neg Hx   . Angioedema Neg Hx      Current Outpatient Medications:  .  famotidine (PEPCID) 20 MG tablet, Take 20 mg by mouth 2 (two) times daily., Disp: , Rfl:  .  omeprazole (PRILOSEC) 40 MG capsule, TAKE 1 CAPSULE(40 MG) BY MOUTH TWICE DAILY, Disp: 60 capsule, Rfl: 4 .  pravastatin (PRAVACHOL) 20 MG tablet, TAKE 1 TABLET(20 MG) BY MOUTH DAILY, Disp: 90 tablet, Rfl: 1 .  SYMBICORT 160-4.5 MCG/ACT inhaler, INHALE 2 PUFFS INTO THE LUNGS TWICE DAILY, Disp: 10.2 g, Rfl: 4 .  Vitamin D, Ergocalciferol, (DRISDOL) 1.25 MG (50000 UNIT) CAPS capsule, TAKE 1 CAPSULE BY MOUTH 2 TIMES A WEEK, Disp: 26 capsule, Rfl: 0 .  EPINEPHrine 0.3 mg/0.3 mL IJ SOAJ injection, Use as directed for severe allergic reaction (Patient not taking: Reported on 03/12/2020), Disp: 2 Device, Rfl: 2 .  PROAIR HFA 108 (90 Base) MCG/ACT inhaler, INHALE 2 PUFFS INTO THE LUNGS EVERY 6 HOURS AS NEEDED FOR WHEEZING OR SHORTNESS OF BREATH (Patient not taking: Reported on 03/12/2020), Disp: 8.5 g, Rfl: 1   Allergies  Allergen Reactions  . Contrast Media [Iodinated Diagnostic Agents] Other (See Comments)    Patient breaks out in hives pt has had ct scans in the past with premeds and  has not had reaction.  . Other Nausea And Vomiting, Rash and Other (See Comments)    Seafood allergy     Review of Systems  Constitutional: Negative.   Respiratory: Negative.   Cardiovascular: Positive for chest pain (middle of the chest radiating to her back for about 2 months.).  Gastrointestinal: Positive for abdominal pain.  Musculoskeletal: Negative for back pain.  Psychiatric/Behavioral: Negative.   All other systems reviewed and are negative.    Today's Vitals   03/12/20 1409  BP: 116/74  Pulse: 61  Temp: 98.1 F (36.7 C)  TempSrc: Oral  Weight: 123 lb 6.4 oz (56 kg)  Height: '4\' 8"'  (1.422 m)  PainSc: 9   PainLoc: Leg   Body mass index is 27.67 kg/m.    Objective:  Physical Exam Vitals and nursing note reviewed.  Constitutional:      Appearance: Normal appearance. She is obese.  HENT:     Head: Normocephalic and atraumatic.  Cardiovascular:     Rate and Rhythm: Normal rate and regular rhythm.     Heart sounds: Normal heart sounds.  Pulmonary:     Breath sounds: Normal breath sounds.  Skin:    General: Skin is warm.  Neurological:     General: No focal deficit present.     Mental Status: She is alert and oriented to person, place, and time.         Assessment And Plan:     1. Left leg pain Comments: She agrees to move forward with left knee x-ray. Advised to apply Voltaren gel to affected area twice daily prn. If persistent, I will refer her to Ortho for further evaluation.  - DG Knee Complete 4 Views Left; Future  2. Atypical chest pain Comments: Atypical sx. I think her xs are due to reflux. However, due to cardiac risk factors, I will check EKG - SB w/o acute changes.  Advised to stop eating 3 hours prior to going to bed. - EKG 12-Lead - CBC no Diff - CMP14+EGFR  3. Gastroesophageal reflux disease without esophagitis Comments: Chronic. Advised to stop current meds and take Dexilant samples as directed. Advised to take one capsule daily. She will f/u in six weeks for re-evaluation.      Patient was given opportunity to ask questions. Patient verbalized understanding of the plan and was able to repeat key elements of the plan. All questions were answered to their satisfaction.  Maximino Greenland, MD   I, Maximino Greenland, MD, have reviewed all documentation for this visit. The documentation on 03/30/20 for the exam, diagnosis, procedures, and orders are all accurate and complete.  THE PATIENT IS ENCOURAGED TO PRACTICE SOCIAL DISTANCING DUE TO THE COVID-19 PANDEMIC.

## 2020-03-12 NOTE — Patient Instructions (Addendum)
Stop omeprazole  Start Dexilant, 60mg  ONCE daily.   Call Dr. Monday [phone] to let me know how this is working   Nonspecific Chest Pain, Adult Chest pain can be caused by many different conditions. It can be caused by a condition that is life-threatening and requires treatment right away. It can also be caused by something that is not life-threatening. If you have chest pain, it can be hard to know the difference, so it is important to get help right away to make sure that you do not have a serious condition. Some life-threatening causes of chest pain include:  Heart attack.  A tear in the body's main blood vessel (aortic dissection).  Inflammation around your heart (pericarditis).  A problem in the lungs, such as a blood clot (pulmonary embolism) or a collapsed lung (pneumothorax). Some non life-threatening causes of chest pain include:  Heartburn.  Anxiety or stress.  Damage to the bones, muscles, and cartilage that make up your chest wall.  Pneumonia or bronchitis.  Shingles infection (varicella-zoster virus). Chest pain can feel like:  Pain or discomfort on the surface of your chest or deep in your chest.  Crushing, pressure, aching, or squeezing pain.  Burning or tingling.  Dull or sharp pain that is worse when you move, cough, or take a deep breath.  Pain or discomfort that is also felt in your back, neck, jaw, shoulder, or arm, or pain that spreads to any of these areas. Your chest pain may come and go. It may also be constant. Your health care provider will do lab tests and other studies to find the cause of your pain. Treatment will depend on the cause of your chest pain. Follow these instructions at home: Medicines  Take over-the-counter and prescription medicines only as told by your health care provider.  If you were prescribed an antibiotic, take it as told by your health care provider. Do not stop taking the antibiotic even if you start  to feel better. Lifestyle   Rest as directed by your health care provider.  Do not use any products that contain nicotine or tobacco, such as cigarettes and e-cigarettes. If you need help quitting, ask your health care provider.  Do not drink alcohol.  Make healthy lifestyle choices as recommended. These may include: ? Getting regular exercise. Ask your health care provider to suggest some activities that are safe for you. ? Eating a heart-healthy diet. This includes plenty of fresh fruits and vegetables, whole grains, low-fat (lean) protein, and low-fat dairy products. A dietitian can help you find healthy eating options. ? Maintaining a healthy weight. ? Managing any other health conditions you have, such as high blood pressure (hypertension) or diabetes. ? Reducing stress, such as with yoga or relaxation techniques. General instructions  Pay attention to any changes in your symptoms. Tell your health care provider about them or any new symptoms.  Avoid any activities that cause chest pain.  Keep all follow-up visits as told by your health care provider. This is important. This includes visits for any further testing if your chest pain does not go away. Contact a health care provider if:  Your chest pain does not go away.  You feel depressed.  You have a fever. Get help right away if:  Your chest pain gets worse.  You have a cough that gets worse, or you cough up blood.  You have severe pain in your abdomen.  You faint.  You have sudden, unexplained chest  discomfort.  You have sudden, unexplained discomfort in your arms, back, neck, or jaw.  You have shortness of breath at any time.  You suddenly start to sweat, or your skin gets clammy.  You feel nausea or you vomit.  You suddenly feel lightheaded or dizzy.  You have severe weakness, or unexplained weakness or fatigue.  Your heart begins to beat quickly, or it feels like it is skipping beats. These symptoms  may represent a serious problem that is an emergency. Do not wait to see if the symptoms will go away. Get medical help right away. Call your local emergency services (911 in the [place] Do not drive yourself to the hospital. Summary  Chest pain can be caused by a condition that is serious and requires urgent treatment. It may also be caused by something that is not life-threatening.  If you have chest pain, it is very important to see your health care provider. Your health care provider may do lab tests and other studies to find the cause of your pain.  Follow your health care provider's instructions on taking medicines, making lifestyle changes, and getting emergency treatment if symptoms become worse.  Keep all follow-up visits as told by your health care provider. This includes visits for any further testing if your chest pain does not go away. This information is not intended to replace advice given to you by your health care provider. Make sure you discuss any questions you have with your health care provider. Document Revised: 12/21/2017 Document Reviewed: 12/21/2017 Elsevier Patient Education  2020 [employer].

## 2020-03-13 ENCOUNTER — Ambulatory Visit
Admission: RE | Admit: 2020-03-13 | Discharge: 2020-03-13 | Disposition: A | Discharge status: Home / Self Care | Payer: Medicare Other | Source: Ambulatory Visit | Attending: Internal Medicine | Admitting: Internal Medicine

## 2020-03-13 DIAGNOSIS — M79605 Pain in left leg: Secondary | ICD-10-CM

## 2020-03-13 DIAGNOSIS — M1712 Unilateral primary osteoarthritis, left knee: Secondary | ICD-10-CM | POA: Diagnosis not present

## 2020-03-13 DIAGNOSIS — M25462 Effusion, left knee: Secondary | ICD-10-CM | POA: Diagnosis not present

## 2020-03-13 LAB — CBC
Hematocrit: 34.9 % (ref 34.0–46.6)
Hemoglobin: 11.1 g/dL (ref 11.1–15.9)
MCH: 25.8 pg — ABNORMAL LOW (ref 26.6–33.0)
MCHC: 31.8 g/dL (ref 31.5–35.7)
MCV: 81 fL (ref 79–97)
Platelets: 270 10*3/uL (ref 150–450)
RBC: 4.3 x10E6/uL (ref 3.77–5.28)
RDW: 14.5 % (ref 11.7–15.4)
WBC: 9.2 10*3/uL (ref 3.4–10.8)

## 2020-03-13 LAB — CMP14+EGFR
ALT: 13 IU/L (ref 0–32)
AST: 19 IU/L (ref 0–40)
Albumin/Globulin Ratio: 1.2 (ref 1.2–2.2)
Albumin: 4.2 g/dL (ref 3.7–4.7)
Alkaline Phosphatase: 85 IU/L (ref 48–121)
BUN/Creatinine Ratio: 19 (ref 12–28)
BUN: 11 mg/dL (ref 8–27)
Bilirubin Total: 0.3 mg/dL (ref 0.0–1.2)
CO2: 27 mmol/L (ref 20–29)
Calcium: 9.1 mg/dL (ref 8.7–10.3)
Chloride: 101 mmol/L (ref 96–106)
Creatinine, Ser: 0.57 mg/dL (ref 0.57–1.00)
GFR calc Af Amer: 103 mL/min/{1.73_m2} (ref >59)
GFR calc non Af Amer: 89 mL/min/{1.73_m2} (ref >59)
Globulin, Total: 3.6 g/dL (ref 1.5–4.5)
Glucose: 90 mg/dL (ref 65–99)
Potassium: 5 mmol/L (ref 3.5–5.2)
Sodium: 140 mmol/L (ref 134–144)
Total Protein: 7.8 g/dL (ref 6.0–8.5)

## 2020-03-16 ENCOUNTER — Telehealth: Payer: Self-pay

## 2020-03-16 NOTE — Telephone Encounter (Signed)
The pt called and left a message to tell Dr. Allyne Gee that she is feeling better.

## 2020-04-09 ENCOUNTER — Other Ambulatory Visit: Payer: Self-pay | Admitting: Internal Medicine

## 2020-04-20 ENCOUNTER — Ambulatory Visit: Payer: Medicare Other | Admitting: Nurse Practitioner

## 2020-05-04 ENCOUNTER — Other Ambulatory Visit: Payer: Self-pay | Admitting: Nurse Practitioner

## 2020-05-04 ENCOUNTER — Other Ambulatory Visit: Payer: Self-pay | Admitting: Allergy and Immunology

## 2020-05-04 DIAGNOSIS — E7800 Pure hypercholesterolemia, unspecified: Secondary | ICD-10-CM

## 2020-06-06 DIAGNOSIS — Z20822 Contact with and (suspected) exposure to covid-19: Secondary | ICD-10-CM | POA: Diagnosis not present

## 2020-06-11 ENCOUNTER — Ambulatory Visit (INDEPENDENT_AMBULATORY_CARE_PROVIDER_SITE_OTHER): Payer: Medicare Other

## 2020-06-11 ENCOUNTER — Ambulatory Visit (INDEPENDENT_AMBULATORY_CARE_PROVIDER_SITE_OTHER): Payer: Medicare Other | Admitting: Internal Medicine

## 2020-06-11 ENCOUNTER — Other Ambulatory Visit: Payer: Self-pay

## 2020-06-11 ENCOUNTER — Encounter: Payer: Self-pay | Admitting: Internal Medicine

## 2020-06-11 VITALS — BP 122/64 | HR 65 | Temp 98.4°F | Ht <= 58 in | Wt 122.6 lb

## 2020-06-11 VITALS — BP 122/64 | HR 65 | Temp 98.4°F | Ht <= 58 in | Wt 122.0 lb

## 2020-06-11 DIAGNOSIS — E559 Vitamin D deficiency, unspecified: Secondary | ICD-10-CM | POA: Diagnosis not present

## 2020-06-11 DIAGNOSIS — Z6827 Body mass index (BMI) 27.0-27.9, adult: Secondary | ICD-10-CM

## 2020-06-11 DIAGNOSIS — E7800 Pure hypercholesterolemia, unspecified: Secondary | ICD-10-CM | POA: Diagnosis not present

## 2020-06-11 DIAGNOSIS — Z23 Encounter for immunization: Secondary | ICD-10-CM | POA: Diagnosis not present

## 2020-06-11 DIAGNOSIS — Z0000 Encounter for general adult medical examination without abnormal findings: Secondary | ICD-10-CM | POA: Diagnosis not present

## 2020-06-11 DIAGNOSIS — E663 Overweight: Secondary | ICD-10-CM

## 2020-06-11 NOTE — Patient Instructions (Signed)
Samantha [name] , Thank you for taking time to come for your Medicare Wellness Visit. I appreciate your ongoing commitment to your health goals. Please review the following plan we discussed and let me know if I can assist you in the future.   Screening recommendations/referrals: Colonoscopy: not required Mammogram: not required Bone Density: completed 06/01/2016 Recommended yearly ophthalmology/optometry visit for glaucoma screening and checkup Recommended yearly dental visit for hygiene and checkup  Vaccinations: Influenza vaccine: today Pneumococcal vaccine: completed 06/10/2019 Tdap vaccine: completed 05/24/2017, due 05/25/2027 Shingles vaccine: discussed   Covid-19: 10/28/2019, 11/08/2019  Advanced directives: Advance directive discussed with you today. Even though you declined this today please call our office should you change your mind and we can give you the proper paperwork for you to fill out.  Conditions/risks identified: none  Next appointment: Follow up in one year for your annual wellness visit  06/17/2021 at 8:30   Preventive Care 65 Years and Older, Female Preventive care refers to lifestyle choices and visits with your health care provider that can promote health and wellness. What does preventive care include?  A yearly physical exam. This is also called an annual well check.  Dental exams once or twice a year.  Routine eye exams. Ask your health care provider how often you should have your eyes checked.  Personal lifestyle choices, including:  Daily care of your teeth and gums.  Regular physical activity.  Eating a healthy diet.  Avoiding tobacco and drug use.  Limiting alcohol use.  Practicing safe sex.  Taking low-dose aspirin every day.  Taking vitamin and mineral supplements as recommended by your health care provider. What happens during an annual well check? The services and screenings done by your health care provider during your annual well check  will depend on your age, overall health, lifestyle risk factors, and family history of disease. Counseling  Your health care provider may ask you questions about your:  Alcohol use.  Tobacco use.  Drug use.  Emotional well-being.  Home and relationship well-being.  Sexual activity.  Eating habits.  History of falls.  Memory and ability to understand (cognition).  Work and work Astronomer.  Reproductive health. Screening  You may have the following tests or measurements:  Height, weight, and BMI.  Blood pressure.  Lipid and cholesterol levels. These may be checked every 5 years, or more frequently if you are over 18 years old.  Skin check.  Lung cancer screening. You may have this screening every year starting at age 73 if you have a 30-pack-year history of smoking and currently smoke or have quit within the past 15 years.  Fecal occult blood test (FOBT) of the stool. You may have this test every year starting at age 49.  Flexible sigmoidoscopy or colonoscopy. You may have a sigmoidoscopy every 5 years or a colonoscopy every 10 years starting at age 82.  Hepatitis C blood test.  Hepatitis B blood test.  Sexually transmitted disease (STD) testing.  Diabetes screening. This is done by checking your blood sugar (glucose) after you have not eaten for a while (fasting). You may have this done every 1-3 years.  Bone density scan. This is done to screen for osteoporosis. You may have this done starting at age 34.  Mammogram. This may be done every 1-2 years. Talk to your health care provider about how often you should have regular mammograms. Talk with your health care provider about your test results, treatment options, and if necessary, the need for more  tests. Vaccines  Your health care provider may recommend certain vaccines, such as:  Influenza vaccine. This is recommended every year.  Tetanus, diphtheria, and acellular pertussis (Tdap, Td) vaccine. You may  need a Td booster every 10 years.  Zoster vaccine. You may need this after age 48.  Pneumococcal 13-valent conjugate (PCV13) vaccine. One dose is recommended after age 42.  Pneumococcal polysaccharide (PPSV23) vaccine. One dose is recommended after age 24. Talk to your health care provider about which screenings and vaccines you need and how often you need them. This information is not intended to replace advice given to you by your health care provider. Make sure you discuss any questions you have with your health care provider. Document Released: 07/17/2015 Document Revised: 03/09/2016 Document Reviewed: 04/21/2015 Elsevier Interactive Patient Education  2017 [employer] Prevention in the Home Falls can cause injuries. They can happen to people of all ages. There are many things you can do to make your home safe and to help prevent falls. What can I do on the outside of my home?  Regularly fix the edges of walkways and driveways and fix any cracks.  Remove anything that might make you trip as you walk through a door, such as a raised step or threshold.  Trim any bushes or trees on the path to your home.  Use bright outdoor lighting.  Clear any walking paths of anything that might make someone trip, such as rocks or tools.  Regularly check to see if handrails are loose or broken. Make sure that both sides of any steps have handrails.  Any raised decks and porches should have guardrails on the edges.  Have any leaves, snow, or ice cleared regularly.  Use sand or salt on walking paths during winter.  Clean up any spills in your garage right away. This includes oil or grease spills. What can I do in the bathroom?  Use night lights.  Install grab bars by the toilet and in the tub and shower. Do not use towel bars as grab bars.  Use non-skid mats or decals in the tub or shower.  If you need to sit down in the shower, use a plastic, non-slip stool.  Keep the floor  dry. Clean up any water that spills on the floor as soon as it happens.  Remove soap buildup in the tub or shower regularly.  Attach bath mats securely with double-sided non-slip rug tape.  Do not have throw rugs and other things on the floor that can make you trip. What can I do in the bedroom?  Use night lights.  Make sure that you have a light by your bed that is easy to reach.  Do not use any sheets or blankets that are too big for your bed. They should not hang down onto the floor.  Have a firm chair that has side arms. You can use this for support while you get dressed.  Do not have throw rugs and other things on the floor that can make you trip. What can I do in the kitchen?  Clean up any spills right away.  Avoid walking on wet floors.  Keep items that you use a lot in easy-to-reach places.  If you need to reach something above you, use a strong step stool that has a grab bar.  Keep electrical cords out of the way.  Do not use floor polish or wax that makes floors slippery. If you must use wax, use non-skid floor  wax.  Do not have throw rugs and other things on the floor that can make you trip. What can I do with my stairs?  Do not leave any items on the stairs.  Make sure that there are handrails on both sides of the stairs and use them. Fix handrails that are broken or loose. Make sure that handrails are as long as the stairways.  Check any carpeting to make sure that it is firmly attached to the stairs. Fix any carpet that is loose or worn.  Avoid having throw rugs at the top or bottom of the stairs. If you do have throw rugs, attach them to the floor with carpet tape.  Make sure that you have a light switch at the top of the stairs and the bottom of the stairs. If you do not have them, ask someone to add them for you. What else can I do to help prevent falls?  Wear shoes that:  Do not have high heels.  Have rubber bottoms.  Are comfortable and fit you  well.  Are closed at the toe. Do not wear sandals.  If you use a stepladder:  Make sure that it is fully opened. Do not climb a closed stepladder.  Make sure that both sides of the stepladder are locked into place.  Ask someone to hold it for you, if possible.  Clearly mark and make sure that you can see:  Any grab bars or handrails.  First and last steps.  Where the edge of each step is.  Use tools that help you move around (mobility aids) if they are needed. These include:  Canes.  Walkers.  Scooters.  Crutches.  Turn on the lights when you go into a dark area. Replace any light bulbs as soon as they burn out.  Set up your furniture so you have a clear path. Avoid moving your furniture around.  If any of your floors are uneven, fix them.  If there are any pets around you, be aware of where they are.  Review your medicines with your doctor. Some medicines can make you feel dizzy. This can increase your chance of falling. Ask your doctor what other things that you can do to help prevent falls. This information is not intended to replace advice given to you by your health care provider. Make sure you discuss any questions you have with your health care provider. Document Released: 04/16/2009 Document Revised: 11/26/2015 Document Reviewed: 07/25/2014 Elsevier Interactive Patient Education  2017 [employer].

## 2020-06-11 NOTE — Patient Instructions (Signed)
Health Maintenance, Female Adopting a healthy lifestyle and getting preventive care are important in promoting health and wellness. Ask your health care provider about:  The right schedule for you to have regular tests and exams.  Things you can do on your own to prevent diseases and keep yourself healthy. What should I know about diet, weight, and exercise? Eat a healthy diet   Eat a diet that includes plenty of vegetables, fruits, low-fat dairy products, and lean protein.  Do not eat a lot of foods that are high in solid fats, added sugars, or sodium. Maintain a healthy weight Body mass index (BMI) is used to identify weight problems. It estimates body fat based on height and weight. Your health care provider can help determine your BMI and help you achieve or maintain a healthy weight. Get regular exercise Get regular exercise. This is one of the most important things you can do for your health. Most adults should:  Exercise for at least 150 minutes each week. The exercise should increase your heart rate and make you sweat (moderate-intensity exercise).  Do strengthening exercises at least twice a week. This is in addition to the moderate-intensity exercise.  Spend less time sitting. Even light physical activity can be beneficial. Watch cholesterol and blood lipids Have your blood tested for lipids and cholesterol at 79 years of age, then have this test every 5 years. Have your cholesterol levels checked more often if:  Your lipid or cholesterol levels are high.  You are older than 79 years of age.  You are at high risk for heart disease. What should I know about cancer screening? Depending on your health history and family history, you may need to have cancer screening at various ages. This may include screening for:  Breast cancer.  Cervical cancer.  Colorectal cancer.  Skin cancer.  Lung cancer. What should I know about heart disease, diabetes, and high blood  pressure? Blood pressure and heart disease  High blood pressure causes heart disease and increases the risk of stroke. This is more likely to develop in people who have high blood pressure readings, are of African descent, or are overweight.  Have your blood pressure checked: ? Every 3-5 years if you are 18-39 years of age. ? Every year if you are 40 years old or older. Diabetes Have regular diabetes screenings. This checks your fasting blood sugar level. Have the screening done:  Once every three years after age 40 if you are at a normal weight and have a low risk for diabetes.  More often and at a younger age if you are overweight or have a high risk for diabetes. What should I know about preventing infection? Hepatitis B If you have a higher risk for hepatitis B, you should be screened for this virus. Talk with your health care provider to find out if you are at risk for hepatitis B infection. Hepatitis C Testing is recommended for:  Everyone born from 1945 through 1965.  Anyone with known risk factors for hepatitis C. Sexually transmitted infections (STIs)  Get screened for STIs, including gonorrhea and chlamydia, if: ? You are sexually active and are younger than 79 years of age. ? You are older than 79 years of age and your health care provider tells you that you are at risk for this type of infection. ? Your sexual activity has changed since you were last screened, and you are at increased risk for chlamydia or gonorrhea. Ask your health care provider if   you are at risk.  Ask your health care provider about whether you are at high risk for HIV. Your health care provider may recommend a prescription medicine to help prevent HIV infection. If you choose to take medicine to prevent HIV, you should first get tested for HIV. You should then be tested every 3 months for as long as you are taking the medicine. Pregnancy  If you are about to stop having your period (premenopausal) and  you may become pregnant, seek counseling before you get pregnant.  Take 400 to 800 micrograms (mcg) of folic acid every day if you become pregnant.  Ask for birth control (contraception) if you want to prevent pregnancy. Osteoporosis and menopause Osteoporosis is a disease in which the bones lose minerals and strength with aging. This can result in bone fractures. If you are 65 years old or older, or if you are at risk for osteoporosis and fractures, ask your health care provider if you should:  Be screened for bone loss.  Take a calcium or vitamin D supplement to lower your risk of fractures.  Be given hormone replacement therapy (HRT) to treat symptoms of menopause. Follow these instructions at home: Lifestyle  Do not use any products that contain nicotine or tobacco, such as cigarettes, e-cigarettes, and chewing tobacco. If you need help quitting, ask your health care provider.  Do not use street drugs.  Do not share needles.  Ask your health care provider for help if you need support or information about quitting drugs. Alcohol use  Do not drink alcohol if: ? Your health care provider tells you not to drink. ? You are pregnant, may be pregnant, or are planning to become pregnant.  If you drink alcohol: ? Limit how much you use to 0-1 drink a day. ? Limit intake if you are breastfeeding.  Be aware of how much alcohol is in your drink. In the [place] one drink equals one 12 oz bottle of beer (355 mL), one 5 oz glass of wine (148 mL), or one 1 oz glass of hard liquor (44 mL). General instructions  Schedule regular health, dental, and eye exams.  Stay current with your vaccines.  Tell your health care provider if: ? You often feel depressed. ? You have ever been abused or do not feel safe at home. Summary  Adopting a healthy lifestyle and getting preventive care are important in promoting health and wellness.  Follow your health care provider's instructions about healthy  diet, exercising, and getting tested or screened for diseases.  Follow your health care provider's instructions on monitoring your cholesterol and blood pressure. This information is not intended to replace advice given to you by your health care provider. Make sure you discuss any questions you have with your health care provider. Document Revised: 06/13/2018 Document Reviewed: 06/13/2018 Elsevier Patient Education  2020 Elsevier Inc.  

## 2020-06-11 NOTE — Progress Notes (Signed)
I,Tianna Badgett,acting as a Neurosurgeon for [name], MD.,have documented all relevant documentation on the behalf of [name], MD,as directed by  [name], MD while in the presence of [name], MD.  This visit occurred during the SARS-CoV-2 public health emergency.  Safety protocols were in place, including screening questions prior to the visit, additional usage of staff PPE, and extensive cleaning of exam room while observing appropriate contact time as indicated for disinfecting solutions.  Subjective:     Patient ID: Samantha [name] , female    DOB: 1940-11-01 , 79 y.o.   MRN: [id number]   Chief Complaint  Patient presents with  . Annual Exam    HPI  She presents today for physical exam.  She has an interpreter.  She reports compliance with medications.    Hyperlipidemia This is a chronic problem. The problem is controlled. There are no known factors aggravating her hyperlipidemia. Current antihyperlipidemic treatment includes statins. The current treatment provides moderate improvement of lipids. Risk factors for coronary artery disease include dyslipidemia, post-menopausal and a sedentary lifestyle.     Past Medical History:  Diagnosis Date  . Asthma   . GERD (gastroesophageal reflux disease)   . H. pylori infection   . Hypercholesteremia   . UTI (lower urinary tract infection)      Family History  Problem Relation Age of Onset  . Cancer Mother   . Asthma Father   . Allergic rhinitis Father   . Food Allergy Father   . Urticaria Neg Hx   . Eczema Neg Hx   . Angioedema Neg Hx      Current Outpatient Medications:  .  EPINEPHrine 0.3 mg/0.3 mL IJ SOAJ injection, Use as directed for severe allergic reaction, Disp: 2 Device, Rfl: 2 .  omeprazole (PRILOSEC) 40 MG capsule, TAKE 1 CAPSULE(40 MG) BY MOUTH TWICE DAILY, Disp: 60 capsule, Rfl: 4 .  pravastatin (PRAVACHOL) 20 MG tablet, TAKE 1 TABLET(20 MG) BY MOUTH DAILY, Disp: 90 tablet, Rfl: 1 .  PROAIR  HFA 108 (90 Base) MCG/ACT inhaler, INHALE 2 PUFFS INTO THE LUNGS EVERY 6 HOURS AS NEEDED FOR WHEEZING OR SHORTNESS OF BREATH, Disp: 8.5 g, Rfl: 1 .  SYMBICORT 160-4.5 MCG/ACT inhaler, INHALE 2 PUFFS INTO THE LUNGS TWICE DAILY, Disp: 10.2 g, Rfl: 1 .  Vitamin D, Ergocalciferol, (DRISDOL) 1.25 MG (50000 UNIT) CAPS capsule, TAKE 1 CAPSULE BY MOUTH TWICE WEEKLY ON TUESDAYS/FRIDAYS, Disp: 24 capsule, Rfl: 3   Allergies  Allergen Reactions  . Contrast Media [Iodinated Diagnostic Agents] Other (See Comments)    Patient breaks out in hives pt has had ct scans in the past with premeds and has not had reaction.  . Other Nausea And Vomiting, Rash and Other (See Comments)    Seafood allergy      The patient states she uses none for birth control. Last LMP was No LMP recorded. Patient has had a hysterectomy.. Negative for Dysmenorrhea. Negative for: breast discharge, breast lump(s), breast pain and breast self exam. Associated symptoms include abnormal vaginal bleeding. Pertinent negatives include abnormal bleeding (hematology), anxiety, decreased libido, depression, difficulty falling sleep, dyspareunia, history of infertility, nocturia, sexual dysfunction, sleep disturbances, urinary incontinence, urinary urgency, vaginal discharge and vaginal itching. Diet regular.The patient states her exercise level is  intermittent.   . The patient's tobacco use is:  Social History   Tobacco Use  Smoking Status Never Smoker  Smokeless Tobacco Never Used  . She has been exposed to passive smoke. The  patient's alcohol use is:  Social History   Substance and Sexual Activity  Alcohol Use No  . Alcohol/week: 0.0 standard drinks    Review of Systems  Constitutional: Negative.   HENT: Negative.   Eyes: Negative.   Respiratory: Negative.   Cardiovascular: Negative.   Gastrointestinal: Negative.   Endocrine: Negative.   Genitourinary: Negative.   Musculoskeletal: Positive for arthralgias.       She c/o joint  pains. States her sx are worsened when she eats pork.   Skin: Negative.   Allergic/Immunologic: Negative.   Neurological: Negative.   Hematological: Negative.   Psychiatric/Behavioral: Negative.      Today's Vitals   06/11/20 1422  BP: 122/64  Pulse: 65  Temp: 98.4 F (36.9 C)  TempSrc: Oral  Weight: 122 lb 9.6 oz (55.6 kg)  Height: 4' 8.2" (1.427 m)   Body mass index is 27.29 kg/m.  Wt Readings from Last 3 Encounters:  06/11/20 122 lb (55.3 kg)  06/11/20 122 lb 9.6 oz (55.6 kg)  03/12/20 123 lb 6.4 oz (56 kg)    Objective:  Physical Exam Vitals and nursing note reviewed.  Constitutional:      Appearance: Normal appearance.  HENT:     Head: Normocephalic and atraumatic.     Right Ear: Tympanic membrane, ear canal and external ear normal.     Left Ear: Tympanic membrane, ear canal and external ear normal.     Nose:     Comments: Deferred, masked    Mouth/Throat:     Comments: Deferred, masked Eyes:     Extraocular Movements: Extraocular movements intact.     Conjunctiva/sclera: Conjunctivae normal.     Pupils: Pupils are equal, round, and reactive to light.  Cardiovascular:     Rate and Rhythm: Normal rate and regular rhythm.     Pulses: Normal pulses.     Heart sounds: Normal heart sounds.  Pulmonary:     Effort: Pulmonary effort is normal.     Breath sounds: Normal breath sounds.  Chest:  Breasts:     Right: Normal.     Left: Normal.    Abdominal:     General: Abdomen is flat. Bowel sounds are normal.     Palpations: Abdomen is soft.  Genitourinary:    Comments: deferred Musculoskeletal:        General: Normal range of motion.     Cervical back: Normal range of motion and neck supple.  Skin:    General: Skin is warm and dry.  Neurological:     General: No focal deficit present.     Mental Status: She is alert and oriented to person, place, and time.  Psychiatric:        Mood and Affect: Mood normal.        Behavior: Behavior normal.       Assessment And Plan:     1. Encounter for health maintenance examination in adult Comments: A full exam was performed. Importance of monthly self breast exams was discussed with the patient. PATIENT IS ADVISED TO GET 30-45 MINUTES REGULAR EXERCISE NO LESS THAN FOUR TO FIVE DAYS PER WEEK - BOTH WEIGHTBEARING EXERCISES AND AEROBIC ARE RECOMMENDED.  PATIENT IS ADVISED TO FOLLOW A HEALTHY DIET WITH AT LEAST SIX FRUITS/VEGGIES PER DAY, DECREASE INTAKE OF RED MEAT, AND TO INCREASE FISH INTAKE TO TWO DAYS PER WEEK.  MEATS/FISH SHOULD NOT BE FRIED, BAKED OR BROILED IS PREFERABLE.  I SUGGEST WEARING SPF 50 SUNSCREEN ON EXPOSED PARTS AND ESPECIALLY WHEN  IN THE DIRECT SUNLIGHT FOR AN EXTENDED PERIOD OF TIME.  PLEASE AVOID FAST FOOD RESTAURANTS AND INCREASE YOUR WATER INTAKE.  2. Pure hypercholesterolemia Comments: Chronic, I will check labs as listed below. Advised to take meds as directed, limit her fried food intake and to stay active.  - CBC - Lipid panel  3. Overweight with body mass index (BMI) of 27 to 27.9 in adult Comments: Her BMI is acceptable for her demographic. Advised to gradually increase her daily activity as tolerated.   4. Vitamin D deficiency Comments: I will check vitamin D level. Pt advised she may have worsening of  her joint pains when levels get low.  - Vitamin D (25 hydroxy)  Patient was given opportunity to ask questions. Patient verbalized understanding of the plan and was able to repeat key elements of the plan. All questions were answered to their satisfaction.  [name], MD   I, [name], MD, have reviewed all documentation for this visit. The documentation on 07/04/20 for the exam, diagnosis, procedures, and orders are all accurate and complete.  THE PATIENT IS ENCOURAGED TO PRACTICE SOCIAL DISTANCING DUE TO THE COVID-19 PANDEMIC.

## 2020-06-11 NOTE — Progress Notes (Signed)
This visit occurred during the SARS-CoV-2 public health emergency.  Safety protocols were in place, including screening questions prior to the visit, additional usage of staff PPE, and extensive cleaning of exam room while observing appropriate contact time as indicated for disinfecting solutions.  Subjective:   Samantha [name] is a 79 y.o. female who presents for Medicare Annual (Subsequent) preventive examination.  Review of Systems     Cardiac Risk Factors include: advanced age (>37men, >78 women);sedentary lifestyle     Objective:    Today's Vitals   06/11/20 1436  BP: 122/64  Pulse: 65  Temp: 98.4 F (36.9 C)  TempSrc: Oral  Weight: 122 lb (55.3 kg)  Height: 4' 8.2" (1.427 m)   Body mass index is 27.16 kg/m.  Advanced Directives 06/11/2020 06/06/2019 09/27/2018 05/21/2018 05/21/2018 05/18/2018 05/17/2018  Does Patient Have a Medical Advance Directive? No No No Yes Yes No No  Type of Advance Directive - - Web designer;Living will Healthcare Power of [place];Living will - -  Does patient want to make changes to medical advance directive? - - - - No - Patient declined - -  Copy of Healthcare Power of Attorney in Chart? - - - No - copy requested No - copy requested - -  Would patient like information on creating a medical advance directive? No - Patient declined - Yes (ED - Information included in AVS) No - Patient declined No - Patient declined - No - Patient declined  Pre-existing out of facility DNR order (yellow form or pink MOST form) - - - - - - -    Current Medications (verified) Outpatient Encounter Medications as of 06/11/2020  Medication Sig  . EPINEPHrine 0.3 mg/0.3 mL IJ SOAJ injection Use as directed for severe allergic reaction  . omeprazole (PRILOSEC) 40 MG capsule TAKE 1 CAPSULE(40 MG) BY MOUTH TWICE DAILY  . pravastatin (PRAVACHOL) 20 MG tablet TAKE 1 TABLET(20 MG) BY MOUTH DAILY  . PROAIR HFA 108 (90 Base) MCG/ACT inhaler INHALE 2 PUFFS INTO THE  LUNGS EVERY 6 HOURS AS NEEDED FOR WHEEZING OR SHORTNESS OF BREATH  . SYMBICORT 160-4.5 MCG/ACT inhaler INHALE 2 PUFFS INTO THE LUNGS TWICE DAILY  . Vitamin D, Ergocalciferol, (DRISDOL) 1.25 MG (50000 UNIT) CAPS capsule TAKE 1 CAPSULE BY MOUTH 2 TIMES A WEEK   No facility-administered encounter medications on file as of 06/11/2020.    Allergies (verified) Contrast media [iodinated diagnostic agents] and Other   History: Past Medical History:  Diagnosis Date  . Asthma   . GERD (gastroesophageal reflux disease)   . H. pylori infection   . Hypercholesteremia   . UTI (lower urinary tract infection)    Past Surgical History:  Procedure Laterality Date  . ABDOMINAL HYSTERECTOMY    . ABDOMINAL SURGERY    . CESAREAN SECTION    . CHOLECYSTECTOMY N/A 05/21/2018   Procedure: LAPAROSCOPIC CHOLECYSTECTOMY WITH ATTEMPTED INTRAOPERATIVE CHOLANGIOGRAM;  Surgeon: Luretha Murphy, MD;  Location: [facility];  Service: General;  Laterality: N/A;   Family History  Problem Relation Age of Onset  . Cancer Mother   . Asthma Father   . Allergic rhinitis Father   . Food Allergy Father   . Urticaria Neg Hx   . Eczema Neg Hx   . Angioedema Neg Hx    Social History   Socioeconomic History  . Marital status: Married    Spouse name: Not on file  . Number of children: Not on file  . Years of education: Not on file  .  Highest education level: Not on file  Occupational History  . Occupation: retired  Tobacco Use  . Smoking status: Never Smoker  . Smokeless tobacco: Never Used  Vaping Use  . Vaping Use: Never used  Substance and Sexual Activity  . Alcohol use: No    Alcohol/week: 0.0 standard drinks  . Drug use: No  . Sexual activity: Not Currently  Other Topics Concern  . Not on file  Social History Narrative  . Not on file   Social Determinants of Health   Financial Resource Strain: Medium Risk  . Difficulty of Paying Living Expenses: Somewhat hard  Food Insecurity: Food Insecurity Present   . Worried About Programme researcher, broadcasting/film/video in the Last Year: Sometimes true  . Ran Out of Food in the Last Year: Sometimes true  Transportation Needs: No Transportation Needs  . Lack of Transportation (Medical): No  . Lack of Transportation (Non-Medical): No  Physical Activity: Inactive  . Days of Exercise per Week: 0 days  . Minutes of Exercise per Session: 0 min  Stress: No Stress Concern Present  . Feeling of Stress : Not at all  Social Connections: Not on file    Tobacco Counseling Counseling given: Not Answered   Clinical Intake:  Pre-visit preparation completed: Yes  Pain : No/denies pain     Nutritional Status: BMI 25 -29 Overweight Nutritional Risks: None Diabetes: No     Diabetic? no  Interpreter Needed?: Yes Interpreter Agency: [employer] Interpreter Name: Diah  Information entered by :: NAllen LPN   Activities of Daily Living In your present state of health, do you have any difficulty performing the following activities: 06/11/2020 06/11/2020  Hearing? N N  Vision? N N  Difficulty concentrating or making decisions? N N  Walking or climbing stairs? N N  Dressing or bathing? N N  Doing errands, shopping? N N  Preparing Food and eating ? N -  Using the Toilet? N -  In the past six months, have you accidently leaked urine? N -  Do you have problems with loss of bowel control? N -  Managing your Medications? N -  Managing your Finances? N -  Housekeeping or managing your Housekeeping? N -  Some recent data might be hidden    Patient Care Team: Dorothyann Peng, MD as PCP - General (Internal Medicine)  Indicate any recent Medical Services you may have received from other than [facility] providers in the past year (date may be approximate).     Assessment:   This is a routine wellness examination for Samantha [name].  Hearing/Vision screen  Hearing Screening   125Hz  250Hz  500Hz  1000Hz  2000Hz  3000Hz  4000Hz  6000Hz  8000Hz   Right ear:           Left ear:           Vision  Screening Comments: No regular eye exams  Dietary issues and exercise activities discussed: Current Exercise Habits: The patient does not participate in regular exercise at present  Goals    .  DIET - INCREASE WATER INTAKE (pt-stated)      Wants to be in good health. Drink plenty of water and eat healthy food.    .  Patient Stated      06/06/2019, no goals at this time    .  Patient Stated      06/11/2020, no goal      Depression Screen PHQ 2/9 Scores 06/11/2020 12/10/2019 06/06/2019 08/28/2018 05/17/2018  PHQ - 2 Score 0 0 0 0 0  PHQ- 9 Score - - 0 - -    Fall Risk Fall Risk  06/11/2020 12/10/2019 06/06/2019 08/28/2018 05/17/2018  Falls in the past year? 0 0 0 0 0  Number falls in past yr: - 0 - - -  Injury with Fall? - 0 - - -  Risk for fall due to : No Fall Risks - - - -  Follow up Falls evaluation completed;Education provided;Falls prevention discussed - Falls evaluation completed;Education provided;Falls prevention discussed - -    FALL RISK PREVENTION PERTAINING TO THE HOME:  Any stairs in or around the home? No  If so, are there any without handrails? n/a Home free of loose throw rugs in walkways, pet beds, electrical cords, etc? Yes  Adequate lighting in your home to reduce risk of falls? Yes   ASSISTIVE DEVICES UTILIZED TO PREVENT FALLS:  Life alert? No  Use of a cane, walker or w/c? No  Grab bars in the bathroom? Yes  Shower chair or bench in shower? No  Elevated toilet seat or a handicapped toilet? No   TIMED UP AND GO:  Was the test performed? No .  .   Gait steady and fast without use of assistive device  Cognitive Function:     6CIT Screen 05/17/2018  What Year? 0 points  What month? 0 points  What time? 0 points  Count back from 20 0 points  Months in reverse 0 points  Repeat phrase 8 points  Total Score 8    Immunizations Immunization History  Administered Date(s) Administered  . Fluad Quad(high Dose 65+) 04/14/2019, 06/11/2020  . PFIZER  SARS-COV-2 Vaccination 10/28/2019, 11/08/2019  . Pneumococcal Conjugate-13 06/10/2019  . Pneumococcal-Unspecified 12/19/2005  . Tdap 05/24/2017    TDAP status: Up to date  Flu Vaccine status: Completed at today's visit  Pneumococcal vaccine status: Up to date  Covid-19 vaccine status: Completed vaccines  Qualifies for Shingles Vaccine? Yes   Zostavax completed No   Shingrix Completed?: No.    Education has been provided regarding the importance of this vaccine. Patient has been advised to call insurance company to determine out of pocket expense if they have not yet received this vaccine. Advised may also receive vaccine at local pharmacy or Health Dept. Verbalized acceptance and understanding.  Screening Tests Health Maintenance  Topic Date Due  . COVID-19 Vaccine (3 - Booster) 05/10/2020  . PNA vac Low Risk Adult (2 of 2 - PPSV23) 06/09/2020  . TETANUS/TDAP  05/25/2027  . INFLUENZA VACCINE  Completed  . DEXA SCAN  Completed  . Hepatitis C Screening  Completed    Health Maintenance  Health Maintenance Due  Topic Date Due  . COVID-19 Vaccine (3 - Booster) 05/10/2020  . PNA vac Low Risk Adult (2 of 2 - PPSV23) 06/09/2020    Colorectal cancer screening: No longer required.   Mammogram status: No longer required due to age.  Bone Density status: Completed 06/01/2016.   Lung Cancer Screening: (Low Dose CT Chest recommended if Age 74-80 years, 30 pack-year currently smoking OR have quit w/in 15years.) does not qualify.   Lung Cancer Screening Referral: no  Additional Screening:  Hepatitis C Screening: does qualify; Completed 12/10/2019  Vision Screening: Recommended annual ophthalmology exams for early detection of glaucoma and other disorders of the eye. Is the patient up to date with their annual eye exam?  No  Who is the provider or what is the name of the office in which the patient attends annual eye exams? None  If pt is not established with a provider, would they  like to be referred to a provider to establish care? No .   Dental Screening: Recommended annual dental exams for proper oral hygiene  Community Resource Referral / Chronic Care Management: CRR required this visit?  Yes   CCM required this visit?  No      Plan:     I have personally reviewed and noted the following in the patient's chart:   . Medical and social history . Use of alcohol, tobacco or illicit drugs  . Current medications and supplements . Functional ability and status . Nutritional status . Physical activity . Advanced directives . List of other physicians . Hospitalizations, surgeries, and ER visits in previous 12 months . Vitals . Screenings to include cognitive, depression, and falls . Referrals and appointments  In addition, I have reviewed and discussed with patient certain preventive protocols, quality metrics, and best practice recommendations. A written personalized care plan for preventive services as well as general preventive health recommendations were provided to patient.     Barb Merino, LPN   [id number]   Nurse Notes: 6 CIT not administered due to language barrier.

## 2020-06-12 ENCOUNTER — Other Ambulatory Visit: Payer: Self-pay | Admitting: Internal Medicine

## 2020-06-12 LAB — LIPID PANEL
Chol/HDL Ratio: 3.5 ratio (ref 0.0–4.4)
Cholesterol, Total: 183 mg/dL (ref 100–199)
HDL: 52 mg/dL (ref >39)
LDL Chol Calc (NIH): 95 mg/dL (ref 0–99)
Triglycerides: 214 mg/dL — ABNORMAL HIGH (ref 0–149)
VLDL Cholesterol Cal: 36 mg/dL (ref 5–40)

## 2020-06-12 LAB — CBC
Hematocrit: 35.4 % (ref 34.0–46.6)
Hemoglobin: 11.4 g/dL (ref 11.1–15.9)
MCH: 25.8 pg — ABNORMAL LOW (ref 26.6–33.0)
MCHC: 32.2 g/dL (ref 31.5–35.7)
MCV: 80 fL (ref 79–97)
Platelets: 305 10*3/uL (ref 150–450)
RBC: 4.42 x10E6/uL (ref 3.77–5.28)
RDW: 14.2 % (ref 11.7–15.4)
WBC: 9.1 10*3/uL (ref 3.4–10.8)

## 2020-06-12 LAB — VITAMIN D 25 HYDROXY (VIT D DEFICIENCY, FRACTURES): Vit D, 25-Hydroxy: 29.7 ng/mL — ABNORMAL LOW (ref 30.0–100.0)

## 2020-06-12 MED ORDER — VITAMIN D (ERGOCALCIFEROL) 1.25 MG (50000 UNIT) PO CAPS
ORAL_CAPSULE | ORAL | 1 refills | Status: DC
Start: 2020-06-12 — End: 2020-06-12

## 2020-06-22 ENCOUNTER — Telehealth: Payer: Self-pay | Admitting: Internal Medicine

## 2020-06-22 NOTE — Telephone Encounter (Signed)
° °  [facility] 06/22/2020    Name: Samantha [name]    MRN: [id number]    DOB: 08-28-1940    AGE: 79 y.o.    GENDER: female    PCP Dorothyann Peng, MD.   Referral Reason: Food Insecurity and Financial Difficulties related to paying bills.   Interventions: Unsuccessful outbound call placed to the patient. Used [employer] to place call for patient, but patient did not answer. Could not leave a message due to no voicemail set up.   Follow up plan: Care guide will follow up with patient by phone over the next week.   [facility] Care Guide, Embedded Care Coordination [facility], Care Management Phone: [phone] Email: [email or web address]

## 2020-07-03 ENCOUNTER — Other Ambulatory Visit: Payer: Self-pay | Admitting: Allergy and Immunology

## 2020-07-08 NOTE — Telephone Encounter (Signed)
Referral Reason: Food Insecurity and Financial Difficulties related to utilities  Interventions:  Third Unsuccessful outbound call placed to the patient. Called [employer] to reach out to patient. Representative from [employer] stated that they do not have a [employer] Intepre[employer] availaible at this time. Will be closing referral for unable to reach patient.    Follow up plan: No further assistance needed at this time.

## 2020-07-14 ENCOUNTER — Ambulatory Visit: Payer: Medicare Other | Admitting: Allergy and Immunology

## 2020-07-14 ENCOUNTER — Other Ambulatory Visit: Payer: Self-pay

## 2020-07-14 ENCOUNTER — Encounter: Payer: Self-pay | Admitting: Allergy and Immunology

## 2020-07-14 VITALS — BP 132/70 | HR 60 | Temp 98.2°F | Resp 16 | Ht 59.0 in | Wt 123.6 lb

## 2020-07-14 DIAGNOSIS — J3089 Other allergic rhinitis: Secondary | ICD-10-CM | POA: Diagnosis not present

## 2020-07-14 DIAGNOSIS — J4550 Severe persistent asthma, uncomplicated: Secondary | ICD-10-CM

## 2020-07-14 DIAGNOSIS — R1013 Epigastric pain: Secondary | ICD-10-CM | POA: Diagnosis not present

## 2020-07-14 DIAGNOSIS — K219 Gastro-esophageal reflux disease without esophagitis: Secondary | ICD-10-CM | POA: Diagnosis not present

## 2020-07-14 DIAGNOSIS — Z91013 Allergy to seafood: Secondary | ICD-10-CM | POA: Diagnosis not present

## 2020-07-14 DIAGNOSIS — G8929 Other chronic pain: Secondary | ICD-10-CM

## 2020-07-14 DIAGNOSIS — M546 Pain in thoracic spine: Secondary | ICD-10-CM

## 2020-07-14 MED ORDER — ALBUTEROL SULFATE HFA 108 (90 BASE) MCG/ACT IN AERS: 2.0000 | INHALATION_SPRAY | RESPIRATORY_TRACT | 1 refills | Status: DC | PRN

## 2020-07-14 MED ORDER — SYMBICORT 160-4.5 MCG/ACT IN AERO: 2.0000 | INHALATION_SPRAY | Freq: Two times a day (BID) | RESPIRATORY_TRACT | 5 refills | Status: DC

## 2020-07-14 NOTE — Patient Instructions (Addendum)
  1.  Continue to perform Allergen avoidance measures - No seafood  2.  Continue to Treat and prevent inflammation:   A.  Symbicort 160 - 2 inhalations twice a day with spacer  3.  Continue to Treat and prevent reflux:   A.  omeprazole 40mg  2 times per day   4.  If needed:   A.  Proventil HFA or similar 2 puffs every 4-6 hours  B.  Nasal saline  C.  Epi-Pen  5.  Obtain Covid booster this Saturday  6. Obtain thoracic spine X-ray for back pain  7. Obtain Complete abdominal U/S for abdominal pain  8. Further evaluation???  9. Return to clinic in 6 months or earlier if problem

## 2020-07-14 NOTE — Progress Notes (Signed)
[place] [facility] [place] [facility] [place] - [place]   Follow-up Note  Referring Provider: Dorothyann Peng, MD Primary Provider: Dorothyann Peng, MD Date of Office Visit: 07/14/2020  Subjective:   Samantha [name] (DOB: May 09, 1941) is a 80 y.o. female who [facility] in re-evaluation of the following:  HPI: Samantha [name] returns to this clinic in reevaluation of asthma and allergic rhinitis and LPR and a history of seafood allergy.  Her last visit to this clinic was 07 January 2020.  Overall her breathing issue has really done very well and she has not required a systemic steroid or antibiotic for any type of airway issue and rarely uses a short acting bronchodilator while she continues on Symbicort and nasal saline on a pretty consistent basis.  She has not been having any problems with her throat.  She continues on a proton pump inhibitor.  She does not consume any shellfish.  She has had lots of fatigue which is being evaluated by her primary care doctor.  As well, she tells me that over the course of the past 4 months she has been developing a back pain that radiates to her epigastric region on a daily basis.  There does not appear to be any associated nausea and there does not appear to be any associated diarrhea.  This may be a progressive issue over the course of the past 4 months.  Apparently she had some blood test which were normal in investigation of this issue.  She had documentation of gallstones in 2019 requiring a cholecystectomy.  She has received 2 COVID vaccinations and will be receiving the booster this Saturday.  Allergies as of 07/14/2020      Reactions   Contrast Media [iodinated Diagnostic Agents] Other (See Comments)   Patient breaks out in hives pt has had ct scans in the past with premeds and has not had reaction.   Other Nausea And Vomiting, Rash, Other (See Comments)   Seafood allergy      Medication List      EPINEPHrine  0.3 mg/0.3 mL Soaj injection Commonly known as: EPI-PEN Use as directed for severe allergic reaction   omeprazole 40 MG capsule Commonly known as: PRILOSEC TAKE 1 CAPSULE(40 MG) BY MOUTH TWICE DAILY   pravastatin 20 MG tablet Commonly known as: PRAVACHOL TAKE 1 TABLET(20 MG) BY MOUTH DAILY   ProAir HFA 108 (90 Base) MCG/ACT inhaler Generic drug: albuterol INHALE 2 PUFFS INTO THE LUNGS EVERY 6 HOURS AS NEEDED FOR WHEEZING OR SHORTNESS OF BREATH   Symbicort 160-4.5 MCG/ACT inhaler Generic drug: budesonide-formoterol INHALE 2 PUFFS INTO THE LUNGS TWICE DAILY   Vitamin D (Ergocalciferol) 1.25 MG (50000 UNIT) Caps capsule Commonly known as: DRISDOL TAKE 1 CAPSULE BY MOUTH TWICE WEEKLY ON TUESDAYS/FRIDAYS       Past Medical History:  Diagnosis Date  . Asthma   . GERD (gastroesophageal reflux disease)   . H. pylori infection   . Hypercholesteremia   . UTI (lower urinary tract infection)     Past Surgical History:  Procedure Laterality Date  . ABDOMINAL HYSTERECTOMY    . ABDOMINAL SURGERY    . CESAREAN SECTION    . CHOLECYSTECTOMY N/A 05/21/2018   Procedure: LAPAROSCOPIC CHOLECYSTECTOMY WITH ATTEMPTED INTRAOPERATIVE CHOLANGIOGRAM;  Surgeon: Luretha Murphy, MD;  Location: [facility];  Service: General;  Laterality: N/A;    Review of systems negative except as noted in HPI / PMHx or noted below:  Review of Systems  Constitutional: Negative.   HENT: Negative.   Eyes: Negative.   Respiratory: Negative.   Cardiovascular: Negative.   Gastrointestinal: Negative.   Genitourinary: Negative.   Musculoskeletal: Negative.   Skin: Negative.   Neurological: Negative.   Endo/Heme/Allergies: Negative.   Psychiatric/Behavioral: Negative.      Objective:   Vitals:   07/14/20 0913  BP: 132/70  Pulse: 60  Resp: 16  Temp: 98.2 F (36.8 C)  SpO2: 97%   Height: 4\' 11"  (149.9 cm)  Weight: 123 lb 9.6 oz (56.1 kg)   Physical Exam Constitutional:      Appearance: She is not  diaphoretic.  HENT:     Head: Normocephalic.     Right Ear: Tympanic membrane, ear canal and external ear normal.     Left Ear: Tympanic membrane, ear canal and external ear normal.     Nose: Nose normal. No mucosal edema or rhinorrhea.     Mouth/Throat:     Mouth: Oropharynx is clear and moist and mucous membranes are normal.     Pharynx: Uvula midline. No oropharyngeal exudate.  Eyes:     Conjunctiva/sclera: Conjunctivae normal.  Neck:     Thyroid: No thyromegaly.     Trachea: Trachea normal. No tracheal tenderness or tracheal deviation.  Cardiovascular:     Rate and Rhythm: Normal rate and regular rhythm.     Heart sounds: Normal heart sounds, S1 normal and S2 normal. No murmur heard.   Pulmonary:     Effort: No respiratory distress.     Breath sounds: Normal breath sounds. No stridor. No wheezing or rales.  Musculoskeletal:        General: No edema.  Lymphadenopathy:     Head:     Right side of head: No tonsillar adenopathy.     Left side of head: No tonsillar adenopathy.     Cervical: No cervical adenopathy.  Skin:    Findings: No erythema or rash.     Nails: There is no clubbing.  Neurological:     Mental Status: She is alert.     Diagnostics:    Spirometry was performed and demonstrated an FEV1 of 1.15 at 84 % of predicted.  The patient had an Asthma Control Test with the following results:  .    Review of blood tests dated 12 March 2020 identified creatinine 0.57 mg/DL, AST 19 U/L, ALT 13 U/L.  Review of blood tests obtained 11 June 2020 identified WBC 9.1, hemoglobin 11.4, platelet 305.  Results of a complete abdominal ultrasound performed 29 March 2018 identified the following:  Multiple gallstones.  No biliary distention. Exam is otherwise unremarkable.  Assessment and Plan:   1. Asthma, severe persistent, well-controlled   2. Other allergic rhinitis   3. LPRD (laryngopharyngeal reflux disease)   4. Seafood allergy   5. Chronic midline  thoracic back pain   6. Epigastric pain     1.  Continue to perform Allergen avoidance measures - No seafood  2.  Continue to Treat and prevent inflammation:   A.  Symbicort 160 - 2 inhalations twice a day with spacer  3.  Continue to Treat and prevent reflux:   A.  omeprazole 40mg  2 times per day   4.  If needed:   A.  Proventil HFA or similar 2 puffs every 4-6 hours  B.  Nasal saline  C.  Epi-Pen  5.  Obtain Covid booster this Saturday  6. Obtain thoracic spine X-ray for back pain  7. Obtain Complete  abdominal U/S for abdominal pain  8. Further evaluation???  9. Return to clinic in 6 months or earlier if problem  Cherry has had good control of her airway issue which is a combination of respiratory tract inflammation and reflux induced respiratory disease on her current plan.  I am not going to change this plan and we will see her back in this clinic in 6 months.  She has been having back pain that radiates to her epigastric region.  It is difficult to say if this is actually emanating from her back to her epigastric region or she has some type of visceral abnormality contributing to this problem.  She certainly has gallstones documented on a abdominal ultrasound in the 2019 requiring a cholecystectomy and we will obtain another abdominal ultrasound to check the status of her remaining biliary tract and to search for another visceral abnormality contributing to her pain.  As well we will obtain a thoracic spine x-ray to see if there is a possible thoracic fracture or compression.  I will contact her with the results of these test once they are available for review.  [name], MD Allergy / Immunology [facility]

## 2020-07-15 ENCOUNTER — Encounter: Payer: Self-pay | Admitting: Allergy and Immunology

## 2020-08-02 ENCOUNTER — Other Ambulatory Visit: Payer: Self-pay | Admitting: Allergy and Immunology

## 2020-08-04 DIAGNOSIS — K219 Gastro-esophageal reflux disease without esophagitis: Secondary | ICD-10-CM | POA: Diagnosis not present

## 2020-08-04 DIAGNOSIS — R070 Pain in throat: Secondary | ICD-10-CM | POA: Diagnosis not present

## 2020-08-11 ENCOUNTER — Ambulatory Visit
Admission: RE | Admit: 2020-08-11 | Discharge: 2020-08-11 | Disposition: A | Discharge status: Home / Self Care | Payer: Medicare Other | Source: Ambulatory Visit | Attending: Allergy and Immunology | Admitting: Allergy and Immunology

## 2020-08-11 DIAGNOSIS — Z9049 Acquired absence of other specified parts of digestive tract: Secondary | ICD-10-CM | POA: Diagnosis not present

## 2020-08-11 DIAGNOSIS — K7689 Other specified diseases of liver: Secondary | ICD-10-CM | POA: Diagnosis not present

## 2020-08-17 NOTE — Addendum Note (Signed)
Addended by: Robet Leu A on: 08/17/2020 01:02 PM   Modules accepted: Orders

## 2020-08-20 ENCOUNTER — Ambulatory Visit: Payer: Medicare Other | Admitting: Nurse Practitioner

## 2020-08-25 ENCOUNTER — Ambulatory Visit (INDEPENDENT_AMBULATORY_CARE_PROVIDER_SITE_OTHER): Payer: Medicare Other | Admitting: Internal Medicine

## 2020-08-25 ENCOUNTER — Encounter: Payer: Self-pay | Admitting: Internal Medicine

## 2020-08-25 ENCOUNTER — Other Ambulatory Visit: Payer: Self-pay

## 2020-08-25 VITALS — BP 124/78 | HR 67 | Temp 97.8°F | Ht 59.0 in | Wt 123.6 lb

## 2020-08-25 DIAGNOSIS — M5416 Radiculopathy, lumbar region: Secondary | ICD-10-CM

## 2020-08-25 DIAGNOSIS — R935 Abnormal findings on diagnostic imaging of other abdominal regions, including retroperitoneum: Secondary | ICD-10-CM

## 2020-08-25 DIAGNOSIS — K769 Liver disease, unspecified: Secondary | ICD-10-CM | POA: Diagnosis not present

## 2020-08-25 DIAGNOSIS — M1712 Unilateral primary osteoarthritis, left knee: Secondary | ICD-10-CM

## 2020-08-25 DIAGNOSIS — E7800 Pure hypercholesterolemia, unspecified: Secondary | ICD-10-CM

## 2020-08-25 MED ORDER — PRAVASTATIN SODIUM 20 MG PO TABS: ORAL_TABLET | ORAL | 1 refills | Status: DC

## 2020-08-25 NOTE — Patient Instructions (Addendum)
MRI abdomen is scheduled for Monday, March 11th. 9 am Nothing to eat/drink after midnight No hair pins/jewelry    Sciatica  Sciatica is pain, numbness, weakness, or tingling along the path of the sciatic nerve. The sciatic nerve starts in the lower back and runs down the back of each leg. The nerve controls the muscles in the lower leg and in the back of the knee. It also provides feeling (sensation) to the back of the thigh, the lower leg, and the sole of the foot. Sciatica is a symptom of another medical condition that pinches or puts pressure on the sciatic nerve. Sciatica most often only affects one side of the body. Sciatica usually goes away on its own or with treatment. In some cases, sciatica may come back (recur). What are the causes? This condition is caused by pressure on the sciatic nerve or pinching of the nerve. This may be the result of:  A disk in between the bones of the spine bulging out too far (herniated disk).  Age-related changes in the spinal disks.  A pain disorder that affects a muscle in the buttock.  Extra bone growth near the sciatic nerve.  A break (fracture) of the pelvis.  Pregnancy.  Tumor. This is rare. What increases the risk? The following factors may make you more likely to develop this condition:  Playing sports that place pressure or stress on the spine.  Having poor strength and flexibility.  A history of back injury or surgery.  Sitting for long periods of time.  Doing activities that involve repetitive bending or lifting.  Obesity. What are the signs or symptoms? Symptoms can vary from mild to very severe, and they may include:  Any of these problems in the lower back, leg, hip, or buttock: ? Mild tingling, numbness, or dull aches. ? Burning sensations. ? Sharp pains.  Numbness in the back of the calf or the sole of the foot.  Leg weakness.  Severe back pain that makes movement difficult. Symptoms may get worse when you  cough, sneeze, or laugh, or when you sit or stand for long periods of time. How is this diagnosed? This condition may be diagnosed based on:  Your symptoms and medical history.  A physical exam.  Blood tests.  Imaging tests, such as: ? X-rays. ? MRI. ? CT scan. How is this treated? In many cases, this condition improves on its own without treatment. However, treatment may include:  Reducing or modifying physical activity.  Exercising and stretching.  Icing and applying heat to the affected area.  Medicines that help to: ? Relieve pain and swelling. ? Relax your muscles.  Injections of medicines that help to relieve pain, irritation, and inflammation around the sciatic nerve (steroids).  Surgery. Follow these instructions at home: Medicines  Take over-the-counter and prescription medicines only as told by your health care provider.  Ask your health care provider if the medicine prescribed to you: ? Requires you to avoid driving or using heavy machinery. ? Can cause constipation. You may need to take these actions to prevent or treat constipation:  Drink enough fluid to keep your urine pale yellow.  Take over-the-counter or prescription medicines.  Eat foods that are high in fiber, such as beans, whole grains, and fresh fruits and vegetables.  Limit foods that are high in fat and processed sugars, such as fried or sweet foods. Managing pain  If directed, put ice on the affected area. ? Put ice in a plastic bag. ?  Place a towel between your skin and the bag. ? Leave the ice on for 20 minutes, 2-3 times a day.  If directed, apply heat to the affected area. Use the heat source that your health care provider recommends, such as a moist heat pack or a heating pad. ? Place a towel between your skin and the heat source. ? Leave the heat on for 20-30 minutes. ? Remove the heat if your skin turns bright red. This is especially important if you are unable to feel pain,  heat, or cold. You may have a greater risk of getting burned.      Activity  Return to your normal activities as told by your health care provider. Ask your health care provider what activities are safe for you.  Avoid activities that make your symptoms worse.  Take brief periods of rest throughout the day. ? When you rest for longer periods, mix in some mild activity or stretching between periods of rest. This will help to prevent stiffness and pain. ? Avoid sitting for long periods of time without moving. Get up and move around at least one time each hour.  Exercise and stretch regularly, as told by your health care provider.  Do not lift anything that is heavier than 10 lb (4.5 kg) while you have symptoms of sciatica. When you do not have symptoms, you should still avoid heavy lifting, especially repetitive heavy lifting.  When you lift objects, always use proper lifting technique, which includes: ? Bending your knees. ? Keeping the load close to your body. ? Avoiding twisting.   General instructions  Maintain a healthy weight. Excess weight puts extra stress on your back.  Wear supportive, comfortable shoes. Avoid wearing high heels.  Avoid sleeping on a mattress that is too soft or too hard. A mattress that is firm enough to support your back when you sleep may help to reduce your pain.  Keep all follow-up visits as told by your health care provider. This is important. Contact a health care provider if:  You have pain that: ? Wakes you up when you are sleeping. ? Gets worse when you lie down. ? Is worse than you have experienced in the past. ? Lasts longer than 4 weeks.  You have an unexplained weight loss. Get help right away if:  You are not able to control when you urinate or have bowel movements (incontinence).  You have: ? Weakness in your lower back, pelvis, buttocks, or legs that gets worse. ? Redness or swelling of your back. ? A burning sensation when you  urinate. Summary  Sciatica is pain, numbness, weakness, or tingling along the path of the sciatic nerve.  This condition is caused by pressure on the sciatic nerve or pinching of the nerve.  Sciatica can cause pain, numbness, or tingling in the lower back, legs, hips, and buttocks.  Treatment often includes rest, exercise, medicines, and applying ice or heat. This information is not intended to replace advice given to you by your health care provider. Make sure you discuss any questions you have with your health care provider. Document Revised: 07/09/2018 Document Reviewed: 07/09/2018 Elsevier Patient Education  2021 [employer].

## 2020-08-25 NOTE — Progress Notes (Signed)
I,Kanyah Matsushima,acting as a Neurosurgeon for [name], MD.,have documented all relevant documentation on the behalf of [name], MD,as directed by  [name], MD while in the presence of [name], MD.  This visit occurred during the SARS-CoV-2 public health emergency.  Safety protocols were in place, including screening questions prior to the visit, additional usage of staff PPE, and extensive cleaning of exam room while observing appropriate contact time as indicated for disinfecting solutions.  Subjective:     Patient ID: Samantha [name] , female    DOB: 12-16-1940 , 80 y.o.   MRN: [id number]   Chief Complaint  Patient presents with  . Leg Pain    Left side    HPI  The patient is here today for evaluation of left leg pain. She denies fall/trauma.  She is accompanied by her interpreter today.   Leg Pain  The incident occurred more than 1 week ago. There was no injury mechanism. The pain is present in the left leg and left knee. The quality of the pain is described as aching. The pain is at a severity of 6/10. The pain is moderate. The pain has been constant since onset. Pertinent negatives include no inability to bear weight, loss of motion or numbness. She reports no foreign bodies present. The symptoms are aggravated by movement. She has tried acetaminophen for the symptoms. The treatment provided no relief.     Past Medical History:  Diagnosis Date  . Asthma   . GERD (gastroesophageal reflux disease)   . H. pylori infection   . Hypercholesteremia   . UTI (lower urinary tract infection)      Family History  Problem Relation Age of Onset  . Cancer Mother   . Asthma Father   . Allergic rhinitis Father   . Food Allergy Father   . Urticaria Neg Hx   . Eczema Neg Hx   . Angioedema Neg Hx      Current Outpatient Medications:  .  albuterol (VENTOLIN HFA) 108 (90 Base) MCG/ACT inhaler, Inhale 2 puffs into the lungs every 4 (four) hours as needed for wheezing  or shortness of breath., Disp: 18 g, Rfl: 1 .  EPINEPHrine 0.3 mg/0.3 mL IJ SOAJ injection, Use as directed for severe allergic reaction, Disp: 2 Device, Rfl: 2 .  famotidine (PEPCID) 20 MG tablet, Take 20 mg by mouth 2 (two) times daily., Disp: , Rfl:  .  omeprazole (PRILOSEC) 40 MG capsule, TAKE 1 CAPSULE(40 MG) BY MOUTH TWICE DAILY, Disp: 60 capsule, Rfl: 4 .  SYMBICORT 160-4.5 MCG/ACT inhaler, Inhale 2 puffs into the lungs 2 (two) times daily., Disp: 10.2 g, Rfl: 5 .  Vitamin D, Ergocalciferol, (DRISDOL) 1.25 MG (50000 UNIT) CAPS capsule, TAKE 1 CAPSULE BY MOUTH TWICE WEEKLY ON TUESDAYS/FRIDAYS, Disp: 24 capsule, Rfl: 3 .  pravastatin (PRAVACHOL) 20 MG tablet, TAKE 1 TABLET(20 MG) BY MOUTH DAILY, Disp: 90 tablet, Rfl: 1   Allergies  Allergen Reactions  . Contrast Media [Iodinated Diagnostic Agents] Other (See Comments)    Patient breaks out in hives pt has had ct scans in the past with premeds and has not had reaction.  . Other Nausea And Vomiting, Rash and Other (See Comments)    Seafood allergy     Review of Systems  Constitutional: Negative.   Respiratory: Negative.   Cardiovascular: Negative.   Gastrointestinal: Negative.   Musculoskeletal:       Left leg pain  Neurological: Negative.  Negative for numbness.  Psychiatric/Behavioral: Negative.      Today's Vitals   08/25/20 1046  BP: 124/78  Pulse: 67  Temp: 97.8 F (36.6 C)  TempSrc: Oral  Weight: 123 lb 9.6 oz (56.1 kg)  Height: 4\' 11"  (1.499 m)  PainSc: 8   PainLoc: Leg   Body mass index is 24.96 kg/m.  Wt Readings from Last 3 Encounters:  08/25/20 123 lb 9.6 oz (56.1 kg)  07/14/20 123 lb 9.6 oz (56.1 kg)  06/11/20 122 lb (55.3 kg)   Objective:  Physical Exam Vitals and nursing note reviewed.  Constitutional:      Appearance: Normal appearance.  HENT:     Head: Normocephalic and atraumatic.  Cardiovascular:     Rate and Rhythm: Normal rate and regular rhythm.     Heart sounds: Normal heart sounds.   Pulmonary:     Effort: Pulmonary effort is normal.     Breath sounds: Normal breath sounds.  Musculoskeletal:        General: No tenderness or signs of injury.     Cervical back: Normal range of motion.     Right lower leg: No edema.     Left lower leg: No edema.  Skin:    General: Skin is warm.  Neurological:     General: No focal deficit present.     Mental Status: She is alert.  Psychiatric:        Mood and Affect: Mood normal.        Behavior: Behavior normal.         Assessment And Plan:     1. Primary osteoarthritis of left knee Comments: X-rays fron Sept 2021 reviewed in detail. I will refer her to Ortho for possible injections.  - Ambulatory referral to Orthopedic Surgery  2. Lumbar radiculopathy, chronic Comments: She is encouraged to perform stretching exercises daily. She may benefit from physical therapy.  - Ambulatory referral to Orthopedic Surgery  3. Abnormal ultrasound of abdomen Comments: I contacted [facility] myself during the visit while the interpreter was present. She is scheduled for MRI abdomen w/ wo contrast.   4. Liver lesion Comments: Seen on ultrasound of abdomen. Pt verbalizes understanding (via interpreter) of need for further testing with MRI.  - MR Abdomen W Wo Contrast; Future   Patient was given opportunity to ask questions. Patient verbalized understanding of the plan and was able to repeat key elements of the plan. All questions were answered to their satisfaction.   I, 04-04-1998, MD, have reviewed all documentation for this visit. The documentation on 09/13/20 for the exam, diagnosis, procedures, and orders are all accurate and complete.  THE PATIENT IS ENCOURAGED TO PRACTICE SOCIAL DISTANCING DUE TO THE COVID-19 PANDEMIC.

## 2020-09-11 ENCOUNTER — Ambulatory Visit
Admission: RE | Admit: 2020-09-11 | Discharge: 2020-09-11 | Disposition: A | Discharge status: Home / Self Care | Payer: Medicare Other | Source: Ambulatory Visit | Attending: Internal Medicine | Admitting: Internal Medicine

## 2020-09-11 DIAGNOSIS — K769 Liver disease, unspecified: Secondary | ICD-10-CM

## 2020-09-11 DIAGNOSIS — N281 Cyst of kidney, acquired: Secondary | ICD-10-CM | POA: Diagnosis not present

## 2020-09-11 MED ORDER — GADOBENATE DIMEGLUMINE 529 MG/ML IV SOLN
10.0000 mL | Freq: Once | INTRAVENOUS | Status: AC | PRN
  Administered 2020-09-11: 10 mL via INTRAVENOUS

## 2020-09-13 DIAGNOSIS — R935 Abnormal findings on diagnostic imaging of other abdominal regions, including retroperitoneum: Secondary | ICD-10-CM | POA: Insufficient documentation

## 2020-09-13 DIAGNOSIS — M1712 Unilateral primary osteoarthritis, left knee: Secondary | ICD-10-CM | POA: Insufficient documentation

## 2020-09-13 DIAGNOSIS — M5416 Radiculopathy, lumbar region: Secondary | ICD-10-CM | POA: Insufficient documentation

## 2020-09-15 ENCOUNTER — Ambulatory Visit: Payer: Medicare Other | Admitting: Internal Medicine

## 2020-09-16 ENCOUNTER — Ambulatory Visit: Payer: Medicare Other | Admitting: Internal Medicine

## 2020-09-24 ENCOUNTER — Ambulatory Visit: Payer: Self-pay

## 2020-09-24 ENCOUNTER — Ambulatory Visit (INDEPENDENT_AMBULATORY_CARE_PROVIDER_SITE_OTHER): Payer: Medicare Other | Admitting: Family Medicine

## 2020-09-24 ENCOUNTER — Encounter: Payer: Self-pay | Admitting: Family Medicine

## 2020-09-24 DIAGNOSIS — M545 Low back pain, unspecified: Secondary | ICD-10-CM

## 2020-09-24 DIAGNOSIS — G8929 Other chronic pain: Secondary | ICD-10-CM | POA: Diagnosis not present

## 2020-09-24 DIAGNOSIS — M25562 Pain in left knee: Secondary | ICD-10-CM

## 2020-09-24 MED ORDER — BACLOFEN 10 MG PO TABS: 5.0000 mg | ORAL_TABLET | Freq: Every evening | ORAL | 3 refills | Status: DC | PRN

## 2020-09-24 MED ORDER — CELECOXIB 100 MG PO CAPS
100.0000 mg | ORAL_CAPSULE | Freq: Two times a day (BID) | ORAL | 3 refills | Status: DC | PRN
Start: 2020-09-24 — End: 2021-03-30

## 2020-09-24 NOTE — Progress Notes (Signed)
Office Visit Note   Patient: Samantha [name]           Date of Birth: 1941/04/27           MRN: [id number] Visit Date: 09/24/2020 Requested by: Dorothyann Peng, MD [address] [place],  [place] [address] PCP: Dorothyann Peng, MD  Subjective: Chief Complaint  Patient presents with  . Lower Back - Pain    Pain in the left side of the back, radiating down the posterior left leg, to the ankle x 4 months. NKI  . Left Knee - Pain    Pain all around the knee x 4 months, started around the same time as the back pain. Denies swelling of the knee, but says she has intermittent swelling in the lower leg/calf. No popping/grinding. Sometimes the knee feels like it will give way while walking. No falls.    HPI: She is here with low back and left leg pain.  Symptoms started about 4 months ago, no injury.  Gradual onset of pain in the left lower back with radiation down the back of the leg to the foot.  It hurts worse when standing up from a supine position, feels better when working in her garden.  Denies any bowel or bladder dysfunction, no fevers or chills, no rash on her skin.  Interpreter was present today.  She denies having had problems with her back before.               ROS:   All other systems were reviewed and are negative.  Objective: Vital Signs: There were no vitals taken for this visit.  Physical Exam:  General:  Alert and oriented, in no acute distress. Pulm:  Breathing unlabored. Psy:  Normal mood, congruent affect.  Low back: Slight tenderness over the L5-S1 level, moderate tenderness in the left sciatic notch.  No pain over the SI joint.  No pain with internal hip rotation and she has good hip range of motion.  Straight leg raise is negative, lower extremity strength and reflexes are normal and equal.  Left knee has no effusion, no significant joint line tenderness.  Good range of motion.  She is tender primarily in the back of the knee but there is no point  tenderness.    Imaging: No results found.  Assessment & Plan: 1.  Low back and left leg pain, suspicious for disc protrusion.  Neurologic exam is nonfocal.  Could be SI joint dysfunction. -Discussed options, suggested physical therapy but she does not think she has time for that.  We will try some home exercises instead and baclofen with Celebrex as needed.  If symptoms persist, then MRI scan lumbar spine.     Procedures: No procedures performed        PMFS History: Patient Active Problem List   Diagnosis Date Noted  . Primary osteoarthritis of left knee 09/13/2020  . Lumbar radiculopathy, chronic 09/13/2020  . Abnormal ultrasound of abdomen 09/13/2020  . S/P laparoscopic cholecystectomy 05/21/2018  . Chronic venous insufficiency 05/01/2015  . Varicose veins of lower extremities with complications 05/01/2015  . Periumbilical abdominal pain 04/13/2015  . Colitis 12/19/2012  . Dyslipidemia 12/19/2012  . ASTHMA 02/15/2008  . SHORTNESS OF BREATH (SOB) 02/15/2008  . CHEST PAIN-UNSPECIFIED 02/15/2008   Past Medical History:  Diagnosis Date  . Asthma   . GERD (gastroesophageal reflux disease)   . H. pylori infection   . Hypercholesteremia   . UTI (lower urinary tract infection)  Family History  Problem Relation Age of Onset  . Cancer Mother   . Asthma Father   . Allergic rhinitis Father   . Food Allergy Father   . Urticaria Neg Hx   . Eczema Neg Hx   . Angioedema Neg Hx     Past Surgical History:  Procedure Laterality Date  . ABDOMINAL HYSTERECTOMY    . ABDOMINAL SURGERY    . CESAREAN SECTION    . CHOLECYSTECTOMY N/A 05/21/2018   Procedure: LAPAROSCOPIC CHOLECYSTECTOMY WITH ATTEMPTED INTRAOPERATIVE CHOLANGIOGRAM;  Surgeon: Luretha Murphy, MD;  Location: [facility];  Service: General;  Laterality: N/A;   Social History   Occupational History  . Occupation: retired  Tobacco Use  . Smoking status: Never Smoker  . Smokeless tobacco: Never Used  Vaping Use  .  Vaping Use: Never used  Substance and Sexual Activity  . Alcohol use: No    Alcohol/week: 0.0 standard drinks  . Drug use: No  . Sexual activity: Not Currently

## 2021-01-12 ENCOUNTER — Ambulatory Visit: Payer: Medicare Other | Admitting: Allergy and Immunology

## 2021-01-14 ENCOUNTER — Other Ambulatory Visit: Payer: Self-pay | Admitting: Internal Medicine

## 2021-01-14 DIAGNOSIS — E7800 Pure hypercholesterolemia, unspecified: Secondary | ICD-10-CM

## 2021-01-26 ENCOUNTER — Other Ambulatory Visit: Payer: Self-pay | Admitting: Internal Medicine

## 2021-01-26 DIAGNOSIS — E7800 Pure hypercholesterolemia, unspecified: Secondary | ICD-10-CM

## 2021-01-28 NOTE — Patient Instructions (Addendum)
Severe persistent asthma Recommended getting a chest x-ray due to off-and-on pain in chest and productive cough.  She does not wish to do this at this time Start Spiriva Respimat 1.25 mcg 2 puffs once a day to help prevent cough and wheeze Continue Symbicort 160/4.5 mcg using 2 puffs twice a day with spacer to help prevent cough and wheeze May use albuterol 2 puffs every 4-6 hours as needed for cough, wheeze, tightness in chest, or shortness of breath. Asthma control goals:  Full participation in all desired activities (may need albuterol before activity) Albuterol use two time or less a week on average (not counting use with activity) Cough interfering with sleep two time or less a month Oral steroids no more than once a year No hospitalizations  Allergic rhinitis Continue saline rinse as needed for nasal symptoms  Laryngopharyngeal reflux disease Continue omeprazole 40 mg twice a day Continue dietary and lifestyle modification Keep follow up appointment with GI on 02/20/2021 to discuss poorly controlled reflux symptoms  Seafood allergy Avoid seafood. In case of an allergic reaction, give Benadryl 4 teaspoonfuls every 6 hours, and if life-threatening symptoms occur, inject with EpiPen 0.3 mg.  Epistaxis Pinch both nostrils while leaning forward for at least 5 minutes before checking to see if the bleeding has stopped. If bleeding is not controlled within 5-10 minutes apply a cotton ball soaked with oxymetazoline (Afrin) to the bleeding nostril for a few seconds.  If the problem persists or worsens a referral to ENT for further evaluation may be necessary.  Schedule an appointment to discuss your continued chest pain that you have had off/on for past 5 years   Please let us know if this treatment plan is not working well for you Schedule a follow-up appointment in 2 months or sooner if needed 

## 2021-01-29 ENCOUNTER — Encounter: Payer: Self-pay | Admitting: Family

## 2021-01-29 ENCOUNTER — Other Ambulatory Visit: Payer: Self-pay

## 2021-01-29 ENCOUNTER — Ambulatory Visit: Payer: Medicare Other | Admitting: Family

## 2021-01-29 VITALS — BP 118/76 | HR 53 | Temp 97.6°F | Resp 16 | Ht 59.0 in | Wt 119.2 lb

## 2021-01-29 DIAGNOSIS — R059 Cough, unspecified: Secondary | ICD-10-CM

## 2021-01-29 DIAGNOSIS — Z91013 Allergy to seafood: Secondary | ICD-10-CM

## 2021-01-29 DIAGNOSIS — J4550 Severe persistent asthma, uncomplicated: Secondary | ICD-10-CM | POA: Diagnosis not present

## 2021-01-29 DIAGNOSIS — J3089 Other allergic rhinitis: Secondary | ICD-10-CM | POA: Diagnosis not present

## 2021-01-29 DIAGNOSIS — K219 Gastro-esophageal reflux disease without esophagitis: Secondary | ICD-10-CM | POA: Diagnosis not present

## 2021-01-29 MED ORDER — EPINEPHRINE 0.3 MG/0.3ML IJ SOAJ: 0.3000 mg | INTRAMUSCULAR | 2 refills | Status: DC | PRN

## 2021-01-29 MED ORDER — OMEPRAZOLE 40 MG PO CPDR: 40.0000 mg | DELAYED_RELEASE_CAPSULE | Freq: Two times a day (BID) | ORAL | 5 refills | Status: DC

## 2021-01-29 MED ORDER — ALBUTEROL SULFATE HFA 108 (90 BASE) MCG/ACT IN AERS: 2.0000 | INHALATION_SPRAY | RESPIRATORY_TRACT | 1 refills | Status: DC | PRN

## 2021-01-29 MED ORDER — SYMBICORT 160-4.5 MCG/ACT IN AERO: 2.0000 | INHALATION_SPRAY | Freq: Two times a day (BID) | RESPIRATORY_TRACT | 5 refills | Status: DC

## 2021-01-29 MED ORDER — SPIRIVA RESPIMAT 1.25 MCG/ACT IN AERS: 2.0000 | INHALATION_SPRAY | Freq: Every day | RESPIRATORY_TRACT | 2 refills | Status: DC

## 2021-01-29 NOTE — Progress Notes (Signed)
[address] Debbora Presto [place] [address] Dept: [phone]  FOLLOW UP NOTE  Patient ID: Samantha [name], female    DOB: [birth year]  Age: 80 y.o. MRN: [id number] Date of Office Visit: 01/29/2021  Assessment  Chief Complaint: Asthma (No flares or symptoms ), Cough (Constant cough with mucus clear ), and Other (Sometimes has nosebleeds )  HPI Samantha [name] is a 80 year old female who presents today for follow-up of severe persistent asthma, allergic rhinitis, laryngopharyngeal reflux disease, seafood allergy, chronic midline thoracic back pain, and epigastric pain.  She was last seen on July 14, 2020 by Dr. Lucie Leather.  An interpreter is here with her today and helps provide history.  Severe persistent asthma is reported as moderately controlled with Symbicort 160/4.5 mcg 2 puffs twice a day with spacer and albuterol as needed.  She reports an occasional cough that is productive with white mucus and denies wheezing, tightness in her chest, shortness of breath, fever, chills, and nocturnal awakenings.  She reports that she feels like the mucus is coming up from her chest.  She also feels like something is stuck in her throat and causing her to want to cough.  Since her last office visit she has not required any systemic steroids or made any trips to the emergency room or urgent care due to breathing problems.  She uses her albuterol inhaler 1-2 times a month.  She also reports that she has had off-and-on pain on both sides of her chest.  This pain in her chest has been ongoing for 5 years.  She reports that she has talked to her primary care physician about this and had an ultrasound and MRI and they did not find anything.  Discussed how would like to do a chest x-ray and she does not wish to do this at this time.  Allergic rhinitis is reported as moderately controlled with saline rinses as needed.  She reports seeing blood occasionally when she is cleaning her nose.  It is not nosebleeds.  She denies  rhinorrhea, nasal congestion, and postnasal drip.  She has not had any sinus infections since we last saw her.  Laryngopharyngeal reflux disease is reported as not well controlled with omeprazole 40 mg twice a day.  She reports that she still is having reflux symptoms.  She has an appointment with GI on August 20.  She continues to avoid seafood without any accidental ingestion or use of her epinephrine autoinjector device.  She also reports that she avoids vegetables such as eggplant, pumpkin, and zucchini because she has to go to the bathroom afterwards.   Drug Allergies:  Allergies  Allergen Reactions   Ciprofloxacin Other (See Comments)   Contrast Media [Iodinated Diagnostic Agents] Other (See Comments)    Patient breaks out in hives pt has had ct scans in the past with premeds and has not had reaction.   Shellfish Allergy Other (See Comments)   Other Nausea And Vomiting, Rash and Other (See Comments)    Seafood allergy    Review of Systems: Review of Systems  Constitutional:  Negative for chills and fever.  HENT:         Reports seeing occasional blood in her nose when she cleans her nose.  Denies rhinorrhea, nasal congestion, and postnasal drip.  Eyes:        Denies itchy watery eyes  Respiratory:  Positive for cough. Negative for shortness of breath and wheezing.        She reports occasional cough that she  feels is due to mucus coming up and feeling like something is stuck in her throat.  She reports the mucus is white.  She denies any wheezing, tightness in her chest, shortness of breath, and nocturnal awakenings  Cardiovascular:  Positive for chest pain. Negative for palpitations.       Reports off-and-on chest pain for the past 5 years on the left and the right side of her chest.  She reports that her primary care physician is aware and has done an ultrasound.  Gastrointestinal:        Reports reflux symptoms while taking omeprazole 40 mg twice a day  Genitourinary:   Negative for dysuria.  Skin:  Negative for itching and rash.  Neurological:  Negative for headaches.  Endo/Heme/Allergies:  Positive for environmental allergies.    Physical Exam: BP 118/76   Pulse (!) 53   Temp 97.6 F (36.4 C)   Resp 16   Ht 4\' 11"  (1.499 m)   Wt 119 lb 3.2 oz (54.1 kg)   SpO2 98%   BMI 24.08 kg/m    Physical Exam HENT:     Head: Normocephalic and atraumatic.     Comments: Pharynx normal, eyes normal, ears normal, nose normal    Right Ear: Tympanic membrane, ear canal and external ear normal.     Left Ear: Tympanic membrane, ear canal and external ear normal.     Nose: Nose normal.     Mouth/Throat:     Mouth: Mucous membranes are moist.     Pharynx: Oropharynx is clear.  Eyes:     Conjunctiva/sclera: Conjunctivae normal.  Cardiovascular:     Rate and Rhythm: Regular rhythm.     Heart sounds: Normal heart sounds.  Pulmonary:     Effort: Pulmonary effort is normal.     Breath sounds: Normal breath sounds.     Comments: Lungs clear to auscultation Musculoskeletal:        General: Normal range of motion.     Cervical back: Neck supple.  Neurological:     Mental Status: She is alert and oriented to person, place, and time.  Psychiatric:        Mood and Affect: Mood normal.        Behavior: Behavior normal.        Thought Content: Thought content normal.        Judgment: Judgment normal.    Diagnostics: FVC 1.41 L, FEV1 1.07 L.  Predicted FVC 2.03 L, predicted FEV1 1.58 L.  Spirometry indicates mild restriction.  She did not have time to do 4 puffs of Xopenex and repeat spirometry due to having to babysit her grandchild.  Assessment and Plan: 1. Asthma, severe persistent, well-controlled   2. Other allergic rhinitis   3. LPRD (laryngopharyngeal reflux disease)   4. Seafood allergy   5. Cough     Meds ordered this encounter  Medications   albuterol (VENTOLIN HFA) 108 (90 Base) MCG/ACT inhaler    Sig: Inhale 2 puffs into the lungs every 4  (four) hours as needed for wheezing or shortness of breath.    Dispense:  18 g    Refill:  1   EPINEPHrine 0.3 mg/0.3 mL IJ SOAJ injection    Sig: Inject 0.3 mg into the muscle as needed for anaphylaxis. Use as directed for severe allergic reaction    Dispense:  2 each    Refill:  2   SYMBICORT 160-4.5 MCG/ACT inhaler    Sig: Inhale 2 puffs into  the lungs 2 (two) times daily.    Dispense:  10.2 g    Refill:  5   Tiotropium Bromide Monohydrate (SPIRIVA RESPIMAT) 1.25 MCG/ACT AERS    Sig: Inhale 2 puffs into the lungs daily.    Dispense:  4 g    Refill:  2   omeprazole (PRILOSEC) 40 MG capsule    Sig: Take 1 capsule (40 mg total) by mouth 2 (two) times daily.    Dispense:  60 capsule    Refill:  5     Patient Instructions  Severe persistent asthma Recommended getting a chest x-ray due to off-and-on pain in chest and productive cough.  She does not wish to do this at this time Start Spiriva Respimat 1.25 mcg 2 puffs once a day to help prevent cough and wheeze Continue Symbicort 160/4.5 mcg using 2 puffs twice a day with spacer to help prevent cough and wheeze May use albuterol 2 puffs every 4-6 hours as needed for cough, wheeze, tightness in chest, or shortness of breath. Asthma control goals:  Full participation in all desired activities (may need albuterol before activity) Albuterol use two time or less a week on average (not counting use with activity) Cough interfering with sleep two time or less a month Oral steroids no more than once a year No hospitalizations  Allergic rhinitis Continue saline rinse as needed for nasal symptoms  Laryngopharyngeal reflux disease Continue omeprazole 40 mg twice a day Continue dietary and lifestyle modification Keep follow up appointment with GI on 02/20/2021 to discuss poorly controlled reflux symptoms  Seafood allergy Avoid seafood. In case of an allergic reaction, give Benadryl 4 teaspoonfuls every 6 hours, and if life-threatening  symptoms occur, inject with EpiPen 0.3 mg.  Epistaxis Pinch both nostrils while leaning forward for at least 5 minutes before checking to see if the bleeding has stopped. If bleeding is not controlled within 5-10 minutes apply a cotton ball soaked with oxymetazoline (Afrin) to the bleeding nostril for a few seconds.  If the problem persists or worsens a referral to ENT for further evaluation may be necessary.  Schedule an appointment to discuss your continued chest pain that you have had off/on for past 5 years   Please let us know if this treatment plan is not working well for you Schedule a follow-up appointment in 2 months or sooner if needed Return in about 2 months (around 04/01/2021), or if symptoms worsen or fail to improve.    Thank you for the opportunity to care for this patient.  Please do not hesitate to contact me with questions.  Nehemiah Settle, FNP [facility]

## 2021-03-11 DIAGNOSIS — R070 Pain in throat: Secondary | ICD-10-CM | POA: Diagnosis not present

## 2021-03-11 DIAGNOSIS — K219 Gastro-esophageal reflux disease without esophagitis: Secondary | ICD-10-CM | POA: Diagnosis not present

## 2021-03-30 ENCOUNTER — Encounter: Payer: Self-pay | Admitting: Internal Medicine

## 2021-03-30 ENCOUNTER — Other Ambulatory Visit: Payer: Self-pay

## 2021-03-30 ENCOUNTER — Ambulatory Visit (INDEPENDENT_AMBULATORY_CARE_PROVIDER_SITE_OTHER): Payer: Medicare Other | Admitting: Internal Medicine

## 2021-03-30 VITALS — BP 114/60 | HR 59 | Temp 97.9°F | Ht 59.0 in | Wt 118.8 lb

## 2021-03-30 DIAGNOSIS — Z23 Encounter for immunization: Secondary | ICD-10-CM | POA: Diagnosis not present

## 2021-03-30 DIAGNOSIS — E559 Vitamin D deficiency, unspecified: Secondary | ICD-10-CM | POA: Diagnosis not present

## 2021-03-30 DIAGNOSIS — M1712 Unilateral primary osteoarthritis, left knee: Secondary | ICD-10-CM | POA: Diagnosis not present

## 2021-03-30 DIAGNOSIS — E7800 Pure hypercholesterolemia, unspecified: Secondary | ICD-10-CM | POA: Diagnosis not present

## 2021-03-30 DIAGNOSIS — G8929 Other chronic pain: Secondary | ICD-10-CM

## 2021-03-30 NOTE — Progress Notes (Signed)
Rich Brave [place],acting as a Education administrator for Maximino Greenland, MD.,have documented all relevant documentation on the behalf of Maximino Greenland, MD,as directed by  Maximino Greenland, MD while in the presence of Maximino Greenland, MD.  This visit occurred during the SARS-CoV-2 public health emergency.  Safety protocols were in place, including screening questions prior to the visit, additional usage of staff PPE, and extensive cleaning of exam room while observing appropriate contact time as indicated for disinfecting solutions.  Subjective:     Patient ID: Samantha [name] , female    DOB: 24-Aug-1940 , 80 y.o.   MRN: [id number]   Chief Complaint  Patient presents with   Knee Pain    HPI  She presents today for further evaluation of left knee pain. She is accompanied by interpreter today. She denies fall/trauma.  She is not sure what triggered her sx. Sx exacerbated by ambulation, esp after being seated for long periods of time.   Knee Pain  The incident occurred more than 1 week ago. The incident occurred at home. There was no injury mechanism. The pain is present in the left knee. The quality of the pain is described as aching. The pain is mild. She reports no foreign bodies present. The symptoms are aggravated by weight bearing. She has tried rest (massaging it helps) for the symptoms.    Past Medical History:  Diagnosis Date   Asthma    GERD (gastroesophageal reflux disease)    H. pylori infection    Hypercholesteremia    UTI (lower urinary tract infection)      Family History  Problem Relation Age of Onset   Cancer Mother    Asthma Father    Allergic rhinitis Father    Food Allergy Father    Urticaria Neg Hx    Eczema Neg Hx    Angioedema Neg Hx      Current Outpatient Medications:    albuterol (VENTOLIN HFA) 108 (90 Base) MCG/ACT inhaler, Inhale 2 puffs into the lungs every 4 (four) hours as needed for wheezing or shortness of breath., Disp: 18 g, Rfl: 1   EPINEPHrine 0.3 mg/0.3 mL  IJ SOAJ injection, Inject 0.3 mg into the muscle as needed for anaphylaxis. Use as directed for severe allergic reaction, Disp: 2 each, Rfl: 2   omeprazole (PRILOSEC) 40 MG capsule, Take 1 capsule (40 mg total) by mouth 2 (two) times daily., Disp: 60 capsule, Rfl: 5   pravastatin (PRAVACHOL) 20 MG tablet, TAKE 1 TABLET(20 MG) BY MOUTH DAILY, Disp: 90 tablet, Rfl: 1   SYMBICORT 160-4.5 MCG/ACT inhaler, Inhale 2 puffs into the lungs 2 (two) times daily., Disp: 10.2 g, Rfl: 5   Allergies  Allergen Reactions   Ciprofloxacin Other (See Comments)   Contrast Media [Iodinated Diagnostic Agents] Other (See Comments)    Patient breaks out in hives pt has had ct scans in the past with premeds and has not had reaction.   Shellfish Allergy Other (See Comments)   Other Nausea And Vomiting, Rash and Other (See Comments)    Seafood allergy     Review of Systems  Constitutional: Negative.   Respiratory: Negative.    Cardiovascular: Negative.   Gastrointestinal: Negative.   Musculoskeletal:  Positive for arthralgias.  Neurological: Negative.   Psychiatric/Behavioral: Negative.      Today's Vitals   03/30/21 0848  BP: 114/60  Pulse: (!) 59  Temp: 97.9 F (36.6 C)  Weight: 118 lb 12.8 oz (53.9 kg)  Height: '4\' 11"'  (  1.499 m)  PainSc: 0-No pain   Body mass index is 23.99 kg/m.   Objective:  Physical Exam Vitals and nursing note reviewed.  Constitutional:      Appearance: Normal appearance.  HENT:     Head: Normocephalic and atraumatic.     Nose:     Comments: Masked     Mouth/Throat:     Comments: Masked  Cardiovascular:     Rate and Rhythm: Normal rate and regular rhythm.     Heart sounds: Normal heart sounds.  Pulmonary:     Effort: Pulmonary effort is normal.     Breath sounds: Normal breath sounds.  Musculoskeletal:        General: Tenderness present.     Cervical back: Normal range of motion.  Skin:    General: Skin is warm.  Neurological:     General: No focal deficit  present.     Mental Status: She is alert.  Psychiatric:        Mood and Affect: Mood normal.        Behavior: Behavior normal.        Assessment And Plan:     1. Primary osteoarthritis of left knee Comments: She is followed by Ortho; however, has not had a recent visit. Encouraged to do exercises assigned by PT.  Also reminded to apply Voltaren gel to front/back/sides of knee two or three times daily as needed.  - AMB Referral to [place]  2. Pure hypercholesterolemia Comments: Chronic, reports compliance with pravastatin. I will check labs as listed below. She will rto in Dec 2022 for her next physical exam.  - CMP14+EGFR - CBC - Lipid panel - AMB Referral to [place]  3. Vitamin D deficiency disease Comments: I will check a vitamin D level and supplement as needed.  - CMP14+EGFR - Vitamin D (25 hydroxy)  4. Immunization due Comments: She was given high dose flu vaccine.  - Flu Vaccine QUAD High Dose(Fluad)    Patient was given opportunity to ask questions. Patient verbalized understanding of the plan and was able to repeat key elements of the plan. All questions were answered to their satisfaction.   I, Maximino Greenland, MD, have reviewed all documentation for this visit. The documentation on 03/30/21 for the exam, diagnosis, procedures, and orders are all accurate and complete.   IF YOU HAVE BEEN REFERRED TO A SPECIALIST, IT MAY TAKE 1-2 WEEKS TO SCHEDULE/PROCESS THE REFERRAL. IF YOU HAVE NOT HEARD FROM US/SPECIALIST IN TWO WEEKS, PLEASE GIVE [place] A CALL AT [phone].   THE PATIENT IS ENCOURAGED TO PRACTICE SOCIAL DISTANCING DUE TO THE COVID-19 PANDEMIC.

## 2021-03-30 NOTE — Patient Instructions (Signed)
?au ??u g?i c?p tnh, Ng??i l?n Acute Knee Pain, Adult ?au ??u g?i c?p tnh x?y ra ??t ng?t v c th? do t?n th??ng, s?ng, ho?c kch ?ng c? v m nng ?? ??u g?i gy ra. ?au c th? do: Ng. Th??ng t?n ??u g?i do cc ??ng tc v?n xo?n. M?t c ?nh vo ??u g?i. Nhi?m trng. ?au ??u g?i c?p tnh c th? t? h?t theo th?i gian v khi ngh? ng?i. N?u khng h?t ?au, chuyn gia ch?m [place] s?c kh?e c th? yu c?u lm cc ki?m tra ?? tm nguyn nhn gy ?au. Cc ki?m tra ny c th? bao g?m: Cc ki?m tra hnh ?nh, ch?ng h?n nh? ch?p x quang, ch?p CT (ch?p c?t l?p), ch?p MRI (ch?p c?ng h??ng t?) ho?c siu m. Ht d?ch kh?p. Trong ki?m tra ny, d?ch ???c l?y t? kh?p g?i v ???c ?nh gi. N?i soi kh?p. Trong ki?m tra ny, m?t ?ng c ?n ???c lu?n vo trong kh?p g?i v hnh ?nh ???c chi?u trn mn hnh TV. Sinh thi?t. Trong ki?m tra ny, m?t m?u m ???c l?y ra kh?i c? th? v ???c nghin c?u d??i knh hi?n vi. Tun th? nh?ng h??ng d?n ny ? nh: N?u qu v? ?eo b?ng ?ai b?o v? ??u g?i ho?c n?p ??u g?i:  ?eo ?ai ho?c ?ng lt ??u g?i theo ch? d?n c?a chuyn gia ch?m [place] s?c kh?e. Ch? c?i ra theo ch? d?n c?a chuyn gia ch?m [place] s?c kh?e. N?i l?ng n?u cc ngn chn b? ?au bu?t, t ho?c tr?? nn l?nh v xanh ti. Gi? cho n s?ch s?. N?u ?ai ho?c ?ng lt ??u g?i khng ph?i lo?i ch?ng th?m n??c: Khng ?? n b? ??t. Che n b?ng l?p ph? ch?ng th?m n??c khi qu v? t?m b?n ho?c t?m vi sen. Ho?t ??ng ?? kh?p g?i ngh? ng?i. Khng lm nh?ng vi?c gy ?au ho?c lm cho ?au tr?m tr?ng h?n. Trnh cc ho?t ??ng ho??c ca?c ba?i t?p tc ??ng ma?nh, ch?ng h?n nh? ch?y, nh?y dy, ho?c nh?y jumping jacks. Lm vi?c v?i m?t chuyn gia v?t l tr? li?u ?? l?p m?t ch??ng trnh t?p luy?n an ton, theo khuy?n ngh? c?a chuyn gia ch?m [place] s?c kh?e. T?p theo cc bi t?p do bc s? v?t l tr? li?u ch? d?n. X? tr ?au, c?ng kh?p v s?ng  N?u ???c ch? d?n, hy ch??m ? l?nh ln ??u g?i b? ?nh h??ng. ?? lm ?i?u ny: N?u qu v? dng ?ng b?c  ho?c ?ai ??u g?i tho ra ???c, hy tho ra theo ch? d?n c?a chuyn gia ch?m [place] s?c kh?e. Cho ? l?nh vo ti ni lng. ?? kh?n t?m ? gi?a da v ti ch??m. Ch??m ? l?nh trong 20 pht, 2-3 l?n m?i ngy. B? ? l?nh ra n?u da qu v? chuy?n sang mu ?? t??i. ?i?u ny r?t quan tr?ng. N?u qu v? khng th? c?m th?y ?au, nng ho?c l?nh, qu v? c nguy c? cao h?n b? t?n th??ng vng ?. N?u ???c ch? d?n, s? d?ng b?ng thun ?? t?o p l?c (p) ln ??u g?i b? th??ng. Vi?c ny c th? ki?m sot s?ng n?, h? tr? v gip qu v? gi?m c?m gic kh ch?u. Nng (nng cao) ??u g?i quy? vi? ln cao h?n tim khi qu v? ng?i ho?c n?m. ??t g?i d??i ??u g?i trong khi ng?. H??ng d?n chung Ch? s? d?ng thu?c khng k ??n v thu?c k ??n theo ch? d?n c?a chuyn gia ch?m [place] s?c  kh?e. Khng s? d?ng b?t k? s?n ph?m no c nicotine ho?c thu?c l, ch?ng ha?n nh? thu?c l d?ng ht, thu?c l ?i?n t? v thu?c l d?ng nhai. N?u qu v? c?n gip ?? ?? cai thu?c, hy h?i chuyn gia ch?m [place] s?c kh?e. N?u qu v? b? th?a cn, hy la?m vi?c v??i chuyn gia ch?m so?c s??c kho?e va? chuyn gia dinh d???ng ?? ???t mu?c tiu gia?m cn co? l??i cho s??c kho?e va? h??p ly? v??i quy? vi?. Tr?ng l??ng t?ng c th? ta?o p l?c ln ??u g?i c?a quy? vi?. Hy ch  ??n b?t c? thay ??i no v? tri?u ch?ng c?a qu v?. Tun th? theo t?t c? cc l?n khm l?i. ?i?u ny c vai tr quan tr?ng. Hy lin l?c v?i chuyn gia ch?m [place] s?c kh?e n?u: ?au ??u g?i ti?p t?c, thay ??i ho?c tr?? nn tr?m tr?ng h?n. Quy? vi? bi? s?t km theo ?au ??u g?i. C?m th?y ??u g?i qu v? ?m khi s? vo ho?c ??u g?i t?y ??. ??u g?i c?a quy? vi? th?t l?i ho?c bi? kho?a ch??t. Yu c?u tr? gip ngay l?p t?c n?u: ??u g?i qu v? s?ng ln v tnh tr?ng s?ng tr? ln tr?m tr?ng h?n. Qu v? khng th? c? ??ng ??u g?i. Qu v? ?au d? d?i ? ??u g?i khng th? x? tr ???c b?ng thu?c gi?m ?au. Tm t?t ?au ??u g?i c?p tnh c th? l do ng, ch?n th??ng, nhi?m trng, t?n th??ng, s?ng, ho?c kch thch  m nng ?? ??u g?i c?a qu v?. Chuyn gia ch?m [place] s?c kh?e c th? th?c hi?n cc ki?m tra ?? tm nguyn nhn gy ?au. Hy ch  ??n b?t c? thay ??i no v? tri?u ch?ng c?a qu v?. Gi?m ?au b?ng cch ngh? ng?i, dng thu?c, ho?t ??ng nh? nhng v s? d?ng ? l?nh. Tm ki?m tr? gip ngay l?p t?c n?u ??u g?i c?a qu v? s?ng ln, qu v? khng th? c? ??ng ??u g?i ho?c qu v? b? ?au d? d?i khng th? x? tr ???c b?ng thu?c. Thng tin ny khng nh?m m?c ?ch thay th? cho l?i khuyn m chuyn gia ch?m [place] s?c kh?e ni v?i qu v?. Hy b?o ??m qu v? ph?i th?o lu?n b?t k? v?n ?? g m qu v? c v?i chuyn gia ch?m [place] s?c kh?e c?a qu v?. Document Revised: 01/20/2020 Document Reviewed: 01/20/2020 Elsevier Patient Education  2022 [employer].

## 2021-03-31 LAB — CMP14+EGFR
ALT: 11 IU/L (ref 0–32)
AST: 19 IU/L (ref 0–40)
Albumin/Globulin Ratio: 1.2 (ref 1.2–2.2)
Albumin: 4.2 g/dL (ref 3.7–4.7)
Alkaline Phosphatase: 86 IU/L (ref 44–121)
BUN/Creatinine Ratio: 25 (ref 12–28)
BUN: 13 mg/dL (ref 8–27)
Bilirubin Total: 0.3 mg/dL (ref 0.0–1.2)
CO2: 26 mmol/L (ref 20–29)
Calcium: 8.9 mg/dL (ref 8.7–10.3)
Chloride: 101 mmol/L (ref 96–106)
Creatinine, Ser: 0.51 mg/dL — ABNORMAL LOW (ref 0.57–1.00)
Globulin, Total: 3.5 g/dL (ref 1.5–4.5)
Glucose: 86 mg/dL (ref 70–99)
Potassium: 4.5 mmol/L (ref 3.5–5.2)
Sodium: 138 mmol/L (ref 134–144)
Total Protein: 7.7 g/dL (ref 6.0–8.5)
eGFR: 95 mL/min/{1.73_m2} (ref >59)

## 2021-03-31 LAB — CBC
Hematocrit: 35.1 % (ref 34.0–46.6)
Hemoglobin: 11.2 g/dL (ref 11.1–15.9)
MCH: 26.1 pg — ABNORMAL LOW (ref 26.6–33.0)
MCHC: 31.9 g/dL (ref 31.5–35.7)
MCV: 82 fL (ref 79–97)
Platelets: 248 10*3/uL (ref 150–450)
RBC: 4.29 x10E6/uL (ref 3.77–5.28)
RDW: 14.9 % (ref 11.7–15.4)
WBC: 7.6 10*3/uL (ref 3.4–10.8)

## 2021-03-31 LAB — LIPID PANEL
Chol/HDL Ratio: 2.9 ratio (ref 0.0–4.4)
Cholesterol, Total: 172 mg/dL (ref 100–199)
HDL: 59 mg/dL (ref >39)
LDL Chol Calc (NIH): 97 mg/dL (ref 0–99)
Triglycerides: 84 mg/dL (ref 0–149)
VLDL Cholesterol Cal: 16 mg/dL (ref 5–40)

## 2021-03-31 LAB — VITAMIN D 25 HYDROXY (VIT D DEFICIENCY, FRACTURES): Vit D, 25-Hydroxy: 24 ng/mL — ABNORMAL LOW (ref 30.0–100.0)

## 2021-04-01 ENCOUNTER — Telehealth: Payer: Self-pay | Admitting: *Deleted

## 2021-04-01 NOTE — Chronic Care Management (AMB) (Signed)
  Chronic Care Management   Note  04/01/2021 Name: Shaelynn Dragos MRN: [id number] DOB: [birth year]  Cherith Tewell is a 80 y.o. year old female who is a primary care patient of Glendale Chard, MD. I reached out to [name] by phone today in response to a referral using interpreter named Emogene Morgan by office of inclusion sent by Ms. Starla Steller's PCP.  Ms. Homewood was given information about Chronic Care Management services today including:  CCM service includes personalized support from designated clinical staff supervised by her physician, including individualized plan of care and coordination with other care providers 24/7 contact phone numbers for assistance for urgent and routine care needs. Service will only be billed when office clinical staff spend 20 minutes or more in a month to coordinate care. Only one practitioner may furnish and bill the service in a calendar month. The patient may stop CCM services at any time (effective at the end of the month) by phone call to the office staff. The patient is responsible for co-pay (up to 20% after annual deductible is met) if co-pay is required by the individual health plan.   Patient agreed to services and verbal consent obtained. Patient would like for social worker to call son Vernica Wachtel to discuss needs. Patient states he is POA.  Follow up plan: Telephone appointment with care management team member scheduled for:04/06/21  [place] Management  Direct Dial: [phone]

## 2021-04-02 ENCOUNTER — Ambulatory Visit: Payer: Medicare Other | Admitting: Family Medicine

## 2021-04-06 ENCOUNTER — Telehealth: Payer: Self-pay

## 2021-04-06 ENCOUNTER — Telehealth: Payer: Medicare Other

## 2021-04-06 NOTE — Telephone Encounter (Signed)
  Care Management   Follow Up Note   04/06/2021 Name: Samantha [name] MRN: [id number] DOB: January 31, 1941   Referred by: Dorothyann Peng, MD Reason for referral : Chronic Care Management (Unsuccessful call)   An unsuccessful telephone outreach was attempted today. The patient was referred to the case management team for assistance with care management and care coordination. SW left a HIPAA compliant voice message with the patients son requesting a return call.  Follow Up Plan: The care management team will reach out to the patient again over the next 30 days.   Bevelyn Ngo, BSW, CDP Social Worker, Certified Dementia Practitioner [facility] Care Management [phone]

## 2021-04-19 ENCOUNTER — Telehealth: Payer: Medicare Other

## 2021-04-19 ENCOUNTER — Ambulatory Visit: Payer: Medicare Other

## 2021-04-19 ENCOUNTER — Ambulatory Visit (INDEPENDENT_AMBULATORY_CARE_PROVIDER_SITE_OTHER): Payer: Medicare Other

## 2021-04-19 DIAGNOSIS — E7800 Pure hypercholesterolemia, unspecified: Secondary | ICD-10-CM

## 2021-04-19 DIAGNOSIS — M1712 Unilateral primary osteoarthritis, left knee: Secondary | ICD-10-CM

## 2021-04-19 DIAGNOSIS — E559 Vitamin D deficiency, unspecified: Secondary | ICD-10-CM

## 2021-04-19 NOTE — Chronic Care Management (AMB) (Signed)
Chronic Care Management   CCM RN Visit Note  04/19/2021 Name: Lennon Richins MRN: [id number] DOB: 05/27/1941  Subjective: Marija Calamari is a 80 y.o. year old female who is a primary care patient of Glendale Chard, MD. The care management team was consulted for assistance with disease management and care coordination needs.    Collaboration with Daneen Schick BSW  for  Case Collaboration   in response to provider referral for case management and/or care coordination services.   Consent to Services:  The patient was given the following information about Chronic Care Management services today, agreed to services, and gave verbal consent: 1. CCM service includes personalized support from designated clinical staff supervised by the primary care provider, including individualized plan of care and coordination with other care providers 2. 24/7 contact phone numbers for assistance for urgent and routine care needs. 3. Service will only be billed when office clinical staff spend 20 minutes or more in a month to coordinate care. 4. Only one practitioner may furnish and bill the service in a calendar month. 5.The patient may stop CCM services at any time (effective at the end of the month) by phone call to the office staff. 6. The patient will be responsible for cost sharing (co-pay) of up to 20% of the service fee (after annual deductible is met). Patient agreed to services and consent obtained.  Patient agreed to services and verbal consent obtained.   Assessment: Review of patient past medical history, allergies, medications, health status, including review of consultants reports, laboratory and other test data, was performed as part of comprehensive evaluation and provision of chronic care management services.   SDOH (Social Determinants of Health) assessments and interventions performed:    CCM Care Plan  Allergies  Allergen Reactions   Ciprofloxacin Other (See Comments)   Contrast Media [Iodinated  Diagnostic Agents] Other (See Comments)    Patient breaks out in hives pt has had ct scans in the past with premeds and has not had reaction.   Shellfish Allergy Other (See Comments)   Other Nausea And Vomiting, Rash and Other (See Comments)    Seafood allergy    Outpatient Encounter Medications as of 04/19/2021  Medication Sig   albuterol (VENTOLIN HFA) 108 (90 Base) MCG/ACT inhaler Inhale 2 puffs into the lungs every 4 (four) hours as needed for wheezing or shortness of breath.   EPINEPHrine 0.3 mg/0.3 mL IJ SOAJ injection Inject 0.3 mg into the muscle as needed for anaphylaxis. Use as directed for severe allergic reaction   omeprazole (PRILOSEC) 40 MG capsule Take 1 capsule (40 mg total) by mouth 2 (two) times daily.   pravastatin (PRAVACHOL) 20 MG tablet TAKE 1 TABLET(20 MG) BY MOUTH DAILY   SYMBICORT 160-4.5 MCG/ACT inhaler Inhale 2 puffs into the lungs 2 (two) times daily.   No facility-administered encounter medications on file as of 04/19/2021.    Patient Active Problem List   Diagnosis Date Noted   Primary osteoarthritis of left knee 09/13/2020   Lumbar radiculopathy, chronic 09/13/2020   Abnormal ultrasound of abdomen 09/13/2020   S/P laparoscopic cholecystectomy 05/21/2018   Chronic venous insufficiency 05/01/2015   Varicose veins of lower extremities with complications 02/54/2706   Periumbilical abdominal pain 04/13/2015   Colitis 12/19/2012   Dyslipidemia 12/19/2012   ASTHMA 02/15/2008   SHORTNESS OF BREATH (SOB) 02/15/2008   CHEST PAIN-UNSPECIFIED 02/15/2008    Conditions to be addressed/monitored: Pure Hypercholesterolemia, Primary Osteoarthritis of left knee   Care Plan : Assist with Chronic Care  Management and Care Coordination needs  Updates made by Lynne Logan, RN since 04/19/2021 12:00 AM     Problem: Assist with Chronic Care Management and Care Coordination needs   Priority: High     Goal: Assist with Chronic Care Management and Care Coordination  needs   Start Date: 04/19/2021  Expected End Date: 06/02/2021  This Visit's Progress: On track  Priority: High  Note:   Current Barriers:  Chronic Disease Management support, education and care coordination needs related to Pure Hypercholesterolemia, Primary Osteoarthritis of left knee  Knowledge deficits related to Medicaid eligibilty and how to apply and Food Insecurities  Case Manager Clinical Goal(s):  Patient will work with the CCM team to address needs related to Pure Hypercholesterolemia, Primary Osteoarthritis of left knee for chronic care management and care coordination needs with RNCM, SW and Pharmacy Care Management and Care Coordination needs Interventions:  Collaborated with BSW to initiate plan of care to address needs related to chronic care management and care coordination for Assistance with Medicaid and Food Insecurities  Collaboration with Glendale Chard, MD regarding development and update of comprehensive plan of care as evidenced by provider attestation and co-signature Inter-disciplinary care team collaboration (see longitudinal plan of care) Patient Goals/Self-Care Activities patient will:   - Patient will work with the CCM team to address chronic care management and care coordination needs and will continue to work with the clinical team to address health care and disease management related needs.    Follow Up Plan: The care management team will reach out to the patient again over the next 30-45 days.       Plan:Telephone follow up appointment with care management team member scheduled for:  04/26/21  Barb Merino, RN, BSN, CCM Care Management Coordinator [place] [facility]  Direct Phone: [phone]

## 2021-04-19 NOTE — Chronic Care Management (AMB) (Signed)
Chronic Care Management    Social Work Note  04/19/2021 Name: Chelly Dombeck MRN: [id number] DOB: 10-10-1940  Samantha [name] is a 80 y.o. year old female who is a primary care patient of Glendale Chard, MD. The CCM team was consulted to assist the patient with chronic disease management and/or care coordination needs related to:  Pure Hypercholesterolemia, Vitamin D Deficiency, Primary Osteoarthritis of Left Knee .   Engaged with patients son by phone  for initial visit in response to provider referral for social work chronic care management and care coordination services.   Consent to Services:  The patient was given the following information about Chronic Care Management services today, agreed to services, and gave verbal consent: 1. CCM service includes personalized support from designated clinical staff supervised by the primary care provider, including individualized plan of care and coordination with other care providers 2. 24/7 contact phone numbers for assistance for urgent and routine care needs. 3. Service will only be billed when office clinical staff spend 20 minutes or more in a month to coordinate care. 4. Only one practitioner may furnish and bill the service in a calendar month. 5.The patient may stop CCM services at any time (effective at the end of the month) by phone call to the office staff. 6. The patient will be responsible for cost sharing (co-pay) of up to 20% of the service fee (after annual deductible is met). Patient agreed to services and consent obtained.  Patient agreed to services and consent obtained.   Assessment: Review of patient past medical history, allergies, medications, and health status, including review of relevant consultants reports was performed today as part of a comprehensive evaluation and provision of chronic care management and care coordination services.     SDOH (Social Determinants of Health) assessments and interventions performed:  SDOH Interventions     Flowsheet Row Most Recent Value  SDOH Interventions   Food Insecurity Interventions Assist with SNAP Application  Financial Strain Interventions Other (Comment)  [Provided education on Medicaid, FNS, and LIEAP. Mailed applications for Medicaid and FNS. LIEAP application window not open until Dec 1- mailed info about how to apply when application window is open]  Housing Interventions Other (Comment)  [mailed information on LIEAP]  Transportation Interventions Intervention Not Indicated        Advanced Directives Status: Not addressed in this encounter.  CCM Care Plan  Allergies  Allergen Reactions   Ciprofloxacin Other (See Comments)   Contrast Media [Iodinated Diagnostic Agents] Other (See Comments)    Patient breaks out in hives pt has had ct scans in the past with premeds and has not had reaction.   Shellfish Allergy Other (See Comments)   Other Nausea And Vomiting, Rash and Other (See Comments)    Seafood allergy    Outpatient Encounter Medications as of 04/19/2021  Medication Sig   albuterol (VENTOLIN HFA) 108 (90 Base) MCG/ACT inhaler Inhale 2 puffs into the lungs every 4 (four) hours as needed for wheezing or shortness of breath.   EPINEPHrine 0.3 mg/0.3 mL IJ SOAJ injection Inject 0.3 mg into the muscle as needed for anaphylaxis. Use as directed for severe allergic reaction   omeprazole (PRILOSEC) 40 MG capsule Take 1 capsule (40 mg total) by mouth 2 (two) times daily.   pravastatin (PRAVACHOL) 20 MG tablet TAKE 1 TABLET(20 MG) BY MOUTH DAILY   SYMBICORT 160-4.5 MCG/ACT inhaler Inhale 2 puffs into the lungs 2 (two) times daily.   No facility-administered encounter medications on file as of  04/19/2021.    Patient Active Problem List   Diagnosis Date Noted   Primary osteoarthritis of left knee 09/13/2020   Lumbar radiculopathy, chronic 09/13/2020   Abnormal ultrasound of abdomen 09/13/2020   S/P laparoscopic cholecystectomy 05/21/2018   Chronic venous insufficiency  05/01/2015   Varicose veins of lower extremities with complications 62/13/0865   Periumbilical abdominal pain 04/13/2015   Colitis 12/19/2012   Dyslipidemia 12/19/2012   ASTHMA 02/15/2008   SHORTNESS OF BREATH (SOB) 02/15/2008   CHEST PAIN-UNSPECIFIED 02/15/2008    Conditions to be addressed/monitored:  Pure Hypercholesterolemia, Vitamin D Deficiency, Primary Osteoarthritis of Left Knee ; Financial constraints related to low income and Limited access to food  Care Plan : Social Work [place]  Updates made by Daneen Schick since 04/19/2021 12:00 AM     Problem: Quality of Life (General Plan of Care)      Goal: Quality of Life Maintained   Start Date: 04/19/2021  Priority: Medium  Note:   Current Barriers:  Chronic disease management support and education needs related to  Pure Hypercholesterolemia, Vitamin D Deficiency, Primary Osteoarthritis of Left Knee   Financial constraints related to fixed income Limited knowledge of resources to assist low income individuals  Social Worker Clinical Goal(s):  patient will work with SW to identify and address any acute and/or chronic care coordination needs related to the self health management of  Pure Hypercholesterolemia, Vitamin D Deficiency, Primary Osteoarthritis of Left Knee   explore community resource options for unmet needs related to:  Sales promotion account executive  and Food Insecurity  SW Interventions:  Inter-disciplinary care team collaboration (see longitudinal plan of care) Collaboration with Glendale Chard, MD regarding development and update of comprehensive plan of care as evidenced by provider attestation and co-signature Successful outbound call placed to the patients son Myisha Pickerel to conduct an SDoH screen Discussed the patients shares a home with her cousin, patient is independent with ADL's and iADL's but does experience financial resource strain Provided verbal education on programs to assist low income persons including  Medicaid, Food and Nutrition Services, as well as Low Income Energy Assistance Program [place]) Discussed these programs may be applied for in person, by mail or online - Johnny requests paper application be sent to the patients home Mailed applications for both Medicaid and FNS to the patients home - her son Charlotte Crumb will assist with completion Mailed information on LIEAP and how to apply when the application window opens in December Discussed SW will follow up with Charlotte Crumb over the next month to confirm receipt of mailed resources while patient engaged with  RN Case Manager  to address care management needs Scheduled follow up call over the next 30 days Collaboration with RN Care Manager to advise of patient enrollment into embedded program as well as SW interventions and plan  Patient Goals/Self-Care Activities patient will: With the help of her son  -  Review mailed resource information -Complete Medicaid and FNS application and submit to DSS -Engage with RN Care Manager to develop an individualized plan of care -Contact SW as needed prior to next scheduled call  Follow Up Plan:  SW will follow up with the patient over the next month       Follow Up Plan: SW will follow up with patient by phone over the next month      Daneen Schick, BSW, CDP Social Worker, Certified Dementia Practitioner [place] [facility] [place] [facility] [phone]

## 2021-04-19 NOTE — Patient Instructions (Signed)
Social Worker Visit Information  Goals we discussed today:   Goals Addressed             This Visit's Progress    Quality of Life Maintained       Timeframe:  Long-Range Goal Priority:  Medium Start Date:    10.17.22                                            Next planned outreach: 11.7.22  Patient Goals/Self-Care Activities patient will: With the help of her son  - Review mailed resource information -Complete Medicaid and FNS application and submit to DSS -Engage with RN Care Manager to develop an individualized plan of care -Contact SW as needed prior to next scheduled call         Materials provided: Yes: provided verbal education on resources as well as mailed applications  [name] was given information about Chronic Care Management services today including:  CCM service includes personalized support from designated clinical staff supervised by her physician, including individualized plan of care and coordination with other care providers 24/7 contact phone numbers for assistance for urgent and routine care needs. Service will only be billed when office clinical staff spend 20 minutes or more in a month to coordinate care. Only one practitioner may furnish and bill the service in a calendar month. The patient may stop CCM services at any time (effective at the end of the month) by phone call to the office staff. The patient will be responsible for cost sharing (co-pay) of up to 20% of the service fee (after annual deductible is met).  Patient agreed to services and verbal consent obtained.   Patient verbalizes understanding of instructions provided today and agrees to view in [place].   Follow up plan: SW will follow up with patient by phone over the next month  Daneen Schick, BSW, CDP Social Worker, Certified Dementia Practitioner [place] [facility] [place] [facility] [phone]      [facility]

## 2021-04-26 ENCOUNTER — Ambulatory Visit: Payer: Self-pay

## 2021-04-26 ENCOUNTER — Telehealth: Payer: Medicare Other

## 2021-04-26 DIAGNOSIS — J4550 Severe persistent asthma, uncomplicated: Secondary | ICD-10-CM

## 2021-04-26 DIAGNOSIS — E7800 Pure hypercholesterolemia, unspecified: Secondary | ICD-10-CM

## 2021-04-26 DIAGNOSIS — M1712 Unilateral primary osteoarthritis, left knee: Secondary | ICD-10-CM

## 2021-04-26 DIAGNOSIS — E559 Vitamin D deficiency, unspecified: Secondary | ICD-10-CM

## 2021-04-26 NOTE — Patient Instructions (Signed)
Visit Information   PATIENT GOALS:   Goals Addressed    Consent to CCM Services: Ms. Harbour was given information about Chronic Care Management services including:  CCM service includes personalized support from designated clinical staff supervised by her physician, including individualized plan of care and coordination with other care providers 24/7 contact phone numbers for assistance for urgent and routine care needs. Service will only be billed when office clinical staff spend 20 minutes or more in a month to coordinate care. Only one practitioner may furnish and bill the service in a calendar month. The patient may stop CCM services at any time (effective at the end of the month) by phone call to the office staff. The patient will be responsible for cost sharing (co-pay) of up to 20% of the service fee (after annual deductible is met).  Patient agreed to services and verbal consent obtained.   The patient verbalized understanding of instructions, educational materials, and care plan provided today and declined offer to receive copy of patient instructions, educational materials, and care plan.   Telephone follow up appointment with care management team member scheduled for: 05/13/21  Barb Merino, RN, BSN, CCM Care Management Coordinator [facility]  Direct Phone: [phone]    CLINICAL CARE PLAN:  Patient Care Plan: RN Care Manager Plan of Care     Problem Identified: No Plan established for Chronic disease education and Care Coordination needs for Pure Hypercholesterolemia, Vitamin D Deficiency, Primary Osteoarthritis of Left Knee, Asthma   Priority: High     Long-Range Goal: Establish plan of care for Chronic disease education and Care Coordination needs for Pure Hypercholesterolemia, Vitamin D Deficiency, Primary Osteoarthritis of Left Knee, Asthma   Start Date: 04/26/2021  Expected End Date: 04/26/2022  This Visit's Progress: On  track  Priority: High  Note:   Current Barriers:  Knowledge Deficits related to plan of care for management of Pure Hypercholesterolemia, Vitamin D Deficiency, Primary Osteoarthritis of Left Knee, Asthma Chronic Disease Management support and education needs related to Pure Hypercholesterolemia, Vitamin D Deficiency, Primary Osteoarthritis of Left Knee, Asthma Language Barrier  RNCM Clinical Goal(s):  Patient will verbalize understanding of plan for management of Pure Hypercholesterolemia, Vitamin D Deficiency, Primary Osteoarthritis of Left Knee, Asthma demonstrate understanding of rationale for each prescribed medication   continue to work with RN Care Manager to address care management and care coordination needs related to  Pure Hypercholesterolemia, Vitamin D Deficiency, Primary Osteoarthritis of Left Knee, Asthma will demonstrate ongoing self health care management ability    through collaboration with RN Care manager, provider, and care team.   Interventions: 1:1 collaboration with primary care provider regarding development and update of comprehensive plan of care as evidenced by provider attestation and co-signature Inter-disciplinary care team collaboration (see longitudinal plan of care) Evaluation of current treatment plan related to  self management and patient's adherence to plan as established by provider  Pain Interventions: Pain assessment performed Medications reviewed Determined son Charlotte Crumb prefers this RN CM speak with the patient directly with a translator for best opportunity for disease education  Attempted to reach patient by telephone with Interpreter, call was unsuccessful with no answer and no voicemail box   Hyperlipidemia Interventions: Medication review performed; medication list updated in electronic medical record.  Counseled on importance of regular laboratory monitoring as prescribed; Determined son Charlotte Crumb prefers this RN CM speak with the patient directly  with a translator for best opportunity for disease education  Attempted to reach patient  by telephone with Interpreter, call was unsuccessful with no answer and no voicemail box  Lipid Panel     Component Value Date/Time   CHOL 172 03/30/2021 0915   TRIG 84 03/30/2021 0915   HDL 59 03/30/2021 0915   CHOLHDL 2.9 03/30/2021 0915   LDLCALC 97 03/30/2021 0915   LABVLDL 16 03/30/2021 0915    New goal. Evaluation of current treatment plan related to  Vitamin D deficiency ,  self-management and patient's adherence to plan as established by provider. Discussed plans with patient for ongoing care management follow up and provided patient with direct contact information for care management team Evaluation of current treatment plan related to Vitamin D deficiency and patient's adherence to plan as established by provider; Discussed plans with patient for ongoing care management follow up and provided patient with direct contact information for care management team; Determined son Charlotte Crumb prefers this RN CM speak with the patient directly with a translator for best opportunity for disease education  Attempted to reach patient by telephone with Interpreter, call was unsuccessful with no answer and no voicemail box   Patient Goals/Self-Care Activities: Patient will self administer medications as prescribed Patient will attend all scheduled provider appointments Patient will call pharmacy for medication refills Patient will attend church or other social activities Patient will continue to perform ADL's independently Patient will continue to perform IADL's independently Patient will call provider office for new concerns or questions Patient will work with BSW to address care coordination needs and will continue to work with the clinical team to address health care and disease management related needs.    Follow Up Plan:  Telephone follow up appointment with care management team member scheduled for:   05/13/21

## 2021-04-26 NOTE — Chronic Care Management (AMB) (Signed)
Chronic Care Management   CCM RN Visit Note  04/26/2021 Name: Samantha [name] MRN: [id number] DOB: [birth year]  Subjective: Samantha [name] is a 80 y.o. year old female who is a primary care patient of Dorothyann Peng, MD. The care management team was consulted for assistance with disease management and care coordination needs.    Engaged with patient by telephone for initial visit in response to provider referral for case management and/or care coordination services.   Consent to Services:  The patient was given information about Chronic Care Management services, agreed to services, and gave verbal consent prior to initiation of services.  Please see initial visit note for detailed documentation.   Patient agreed to services and verbal consent obtained.   Assessment: Review of patient past medical history, allergies, medications, health status, including review of consultants reports, laboratory and other test data, was performed as part of comprehensive evaluation and provision of chronic care management services.   SDOH (Social Determinants of Health) assessments and interventions performed:    CCM Care Plan  Allergies  Allergen Reactions   Ciprofloxacin Other (See Comments)   Contrast Media [Iodinated Diagnostic Agents] Other (See Comments)    Patient breaks out in hives pt has had ct scans in the past with premeds and has not had reaction.   Shellfish Allergy Other (See Comments)   Other Nausea And Vomiting, Rash and Other (See Comments)    Seafood allergy    Outpatient Encounter Medications as of 04/26/2021  Medication Sig   albuterol (VENTOLIN HFA) 108 (90 Base) MCG/ACT inhaler Inhale 2 puffs into the lungs every 4 (four) hours as needed for wheezing or shortness of breath.   EPINEPHrine 0.3 mg/0.3 mL IJ SOAJ injection Inject 0.3 mg into the muscle as needed for anaphylaxis. Use as directed for severe allergic reaction   omeprazole (PRILOSEC) 40 MG capsule Take 1 capsule (40 mg total) by  mouth 2 (two) times daily.   pravastatin (PRAVACHOL) 20 MG tablet TAKE 1 TABLET(20 MG) BY MOUTH DAILY   SYMBICORT 160-4.5 MCG/ACT inhaler Inhale 2 puffs into the lungs 2 (two) times daily.   No facility-administered encounter medications on file as of 04/26/2021.    Patient Active Problem List   Diagnosis Date Noted   Primary osteoarthritis of left knee 09/13/2020   Lumbar radiculopathy, chronic 09/13/2020   Abnormal ultrasound of abdomen 09/13/2020   S/P laparoscopic cholecystectomy 05/21/2018   Chronic venous insufficiency 05/01/2015   Varicose veins of lower extremities with complications 05/01/2015   Periumbilical abdominal pain 04/13/2015   Colitis 12/19/2012   Dyslipidemia 12/19/2012   Asthma 02/15/2008   SHORTNESS OF BREATH (SOB) 02/15/2008   CHEST PAIN-UNSPECIFIED 02/15/2008    Conditions to be addressed/monitored: Pure Hypercholesterolemia, Vitamin D Deficiency, Primary Osteoarthritis of Left Knee, Asthma   Care Plan : RN Care Manager Plan of Care  Updates made by Riley Churches, RN since 04/26/2021 12:00 AM     Problem: No Plan established for Chronic disease education and Care Coordination needs for Pure Hypercholesterolemia, Vitamin D Deficiency, Primary Osteoarthritis of Left Knee, Asthma   Priority: High     Long-Range Goal: Establish plan of care for Chronic disease education and Care Coordination needs for Pure Hypercholesterolemia, Vitamin D Deficiency, Primary Osteoarthritis of Left Knee, Asthma   Start Date: 04/26/2021  Expected End Date: 04/26/2022  This Visit's Progress: On track  Priority: High  Note:   Current Barriers:  Knowledge Deficits related to plan of care for management of Pure Hypercholesterolemia, Vitamin D  Deficiency, Primary Osteoarthritis of Left Knee, Asthma Chronic Disease Management support and education needs related to Pure Hypercholesterolemia, Vitamin D Deficiency, Primary Osteoarthritis of Left Knee, Asthma Language  Barrier  RNCM Clinical Goal(s):  Patient will verbalize understanding of plan for management of Pure Hypercholesterolemia, Vitamin D Deficiency, Primary Osteoarthritis of Left Knee, Asthma demonstrate understanding of rationale for each prescribed medication   continue to work with RN Care Manager to address care management and care coordination needs related to  Pure Hypercholesterolemia, Vitamin D Deficiency, Primary Osteoarthritis of Left Knee, Asthma will demonstrate ongoing self health care management ability    through collaboration with RN Care manager, provider, and care team.   Interventions: 1:1 collaboration with primary care provider regarding development and update of comprehensive plan of care as evidenced by provider attestation and co-signature Inter-disciplinary care team collaboration (see longitudinal plan of care) Evaluation of current treatment plan related to  self management and patient's adherence to plan as established by provider  Pain Interventions: Pain assessment performed Medications reviewed Determined son [name] prefers this RN CM speak with the patient directly with a translator for best opportunity for disease education  Attempted to reach patient by telephone with Interpreter, call was unsuccessful with no answer and no voicemail box   Hyperlipidemia Interventions: Medication review performed; medication list updated in electronic medical record.  Counseled on importance of regular laboratory monitoring as prescribed; Determined son [name] prefers this RN CM speak with the patient directly with a translator for best opportunity for disease education  Attempted to reach patient by telephone with Interpreter, call was unsuccessful with no answer and no voicemail box  Lipid Panel     Component Value Date/Time   CHOL 172 03/30/2021 0915   TRIG 84 03/30/2021 0915   HDL 59 03/30/2021 0915   CHOLHDL 2.9 03/30/2021 0915   LDLCALC 97 03/30/2021 0915   LABVLDL  16 03/30/2021 0915    New goal. Evaluation of current treatment plan related to  Vitamin D deficiency ,  self-management and patient's adherence to plan as established by provider. Discussed plans with patient for ongoing care management follow up and provided patient with direct contact information for care management team Evaluation of current treatment plan related to Vitamin D deficiency and patient's adherence to plan as established by provider; Discussed plans with patient for ongoing care management follow up and provided patient with direct contact information for care management team; Determined son [name] prefers this RN CM speak with the patient directly with a translator for best opportunity for disease education  Attempted to reach patient by telephone with Interpreter, call was unsuccessful with no answer and no voicemail box   Patient Goals/Self-Care Activities: Patient will self administer medications as prescribed Patient will attend all scheduled provider appointments Patient will call pharmacy for medication refills Patient will attend church or other social activities Patient will continue to perform ADL's independently Patient will continue to perform IADL's independently Patient will call provider office for new concerns or questions Patient will work with BSW to address care coordination needs and will continue to work with the clinical team to address health care and disease management related needs.    Follow Up Plan:  Telephone follow up appointment with care management team member scheduled for:  05/13/21     Plan:Telephone follow up appointment with care management team member scheduled for:  05/13/21  Delsa Sale, RN, BSN, CCM Care Management Coordinator [facility] Care Management/[facility]  Direct Phone: [phone]

## 2021-05-03 DIAGNOSIS — J4550 Severe persistent asthma, uncomplicated: Secondary | ICD-10-CM

## 2021-05-03 DIAGNOSIS — E7800 Pure hypercholesterolemia, unspecified: Secondary | ICD-10-CM | POA: Diagnosis not present

## 2021-05-03 DIAGNOSIS — M1712 Unilateral primary osteoarthritis, left knee: Secondary | ICD-10-CM

## 2021-05-10 ENCOUNTER — Telehealth: Payer: Self-pay

## 2021-05-10 ENCOUNTER — Telehealth: Payer: Medicare Other

## 2021-05-10 NOTE — Telephone Encounter (Signed)
  Care Management   Follow Up Note   05/10/2021 Name: Edan Serratore MRN: [id number] DOB: Mar 05, 1941   Referred by: Dorothyann Peng, MD Reason for referral : Chronic Care Management (Unsuccessful call)   An unsuccessful telephone outreach was attempted today. The patient was referred to the case management team for assistance with care management and care coordination. SW left a HIPAA compliant voice message requesting a return call.  Follow Up Plan: The care management team will reach out to the patient again over the next 21 days.   Bevelyn Ngo, BSW, CDP Social Worker, Certified Dementia Practitioner [facility] Care Management [phone]

## 2021-05-11 DIAGNOSIS — E559 Vitamin D deficiency, unspecified: Secondary | ICD-10-CM | POA: Insufficient documentation

## 2021-05-11 DIAGNOSIS — G8929 Other chronic pain: Secondary | ICD-10-CM | POA: Insufficient documentation

## 2021-05-11 DIAGNOSIS — E7800 Pure hypercholesterolemia, unspecified: Secondary | ICD-10-CM | POA: Insufficient documentation

## 2021-05-13 ENCOUNTER — Telehealth: Payer: Medicare Other

## 2021-05-13 ENCOUNTER — Telehealth: Payer: Self-pay

## 2021-05-13 NOTE — Telephone Encounter (Signed)
  Care Management   Follow Up Note   05/13/2021 Name: Samantha [name] MRN: [id number] DOB: 15-Aug-1940   Referred by: Dorothyann Peng, MD Reason for referral : Chronic Care Management (RN CM Follow up )   An unsuccessful telephone outreach was attempted today. The patient was referred to the case management team for assistance with care management and care coordination. Attempted call with language lines interpreter Bruce [phone].   Follow Up Plan: Telephone follow up appointment with care management team member scheduled for: 06/10/21  Delsa Sale, RN, BSN, CCM Care Management Coordinator [facility] Care Management/[facility]  Direct Phone: [phone]

## 2021-05-31 ENCOUNTER — Telehealth: Payer: Self-pay

## 2021-05-31 ENCOUNTER — Telehealth: Payer: Medicare Other

## 2021-05-31 NOTE — Telephone Encounter (Signed)
  Care Management   Follow Up Note   05/31/2021 Name: Samantha [name] MRN: [id number] DOB: [birth year]   Referred by: Dorothyann Peng, MD Reason for referral : Chronic Care Management (Unsuccessful call)   A second unsuccessful telephone outreach was attempted today. The patient was referred to the case management team for assistance with care management and care coordination.   Follow Up Plan: The care management team will reach out to the patient again over the next 30 days.   Bevelyn Ngo, BSW, CDP Social Worker, Certified Dementia Practitioner [facility] Care Management [phone]

## 2021-06-01 ENCOUNTER — Ambulatory Visit (INDEPENDENT_AMBULATORY_CARE_PROVIDER_SITE_OTHER): Payer: Medicare Other | Admitting: Allergy and Immunology

## 2021-06-01 ENCOUNTER — Other Ambulatory Visit: Payer: Self-pay

## 2021-06-01 ENCOUNTER — Encounter: Payer: Self-pay | Admitting: Allergy and Immunology

## 2021-06-01 VITALS — BP 126/74 | HR 63 | Temp 97.9°F | Resp 16 | Ht <= 58 in | Wt 119.6 lb

## 2021-06-01 DIAGNOSIS — J4550 Severe persistent asthma, uncomplicated: Secondary | ICD-10-CM

## 2021-06-01 DIAGNOSIS — Z91013 Allergy to seafood: Secondary | ICD-10-CM | POA: Diagnosis not present

## 2021-06-01 DIAGNOSIS — J3089 Other allergic rhinitis: Secondary | ICD-10-CM

## 2021-06-01 DIAGNOSIS — K219 Gastro-esophageal reflux disease without esophagitis: Secondary | ICD-10-CM

## 2021-06-01 MED ORDER — ALBUTEROL SULFATE HFA 108 (90 BASE) MCG/ACT IN AERS: 2.0000 | INHALATION_SPRAY | RESPIRATORY_TRACT | 1 refills | Status: DC | PRN

## 2021-06-01 MED ORDER — OMEPRAZOLE 40 MG PO CPDR: 40.0000 mg | DELAYED_RELEASE_CAPSULE | Freq: Two times a day (BID) | ORAL | 5 refills | Status: DC

## 2021-06-01 MED ORDER — SYMBICORT 160-4.5 MCG/ACT IN AERO: 2.0000 | INHALATION_SPRAY | Freq: Two times a day (BID) | RESPIRATORY_TRACT | 5 refills | Status: DC

## 2021-06-01 NOTE — Patient Instructions (Addendum)
  1.  Continue to perform Allergen avoidance measures - No seafood  2.  Continue to Treat and prevent inflammation:   A.  Symbicort 160 - 2 inhalations twice a day with spacer  3.  Continue to Treat and prevent reflux:   A.  omeprazole 40mg  2 times per day   4.  If needed:   A.  Proventil HFA or similar 2 puffs every 4-6 hours  B.  Nasal saline  C.  Epi-Pen  5. Return to clinic in 6 months or earlier if problem

## 2021-06-01 NOTE — Progress Notes (Signed)
[place] [facility] [place] [facility] [place] [facility] [place] [facility] [place]   Follow-up Note  Referring Provider: Dorothyann Peng, MD Primary Provider: Dorothyann Peng, MD Date of Office Visit: 06/01/2021  Subjective:   Samantha [name] (DOB: 08/15/1940) is a 80 y.o. female who [facility] in re-evaluation of the following:  HPI: Samantha [name] returns to this clinic in evaluation of asthma, allergic rhinitis, LPR, and seafood allergy.  She was last seen in this clinic by me on 14 July 2020.  She did visit with our nurse practitioner on 29 January 2021.  Overall she has done well since her last visit.  She does have an issue with dyspnea on exertion with has been a longstanding issue.  She rarely has any cough and wheezing and rarely uses a short acting bronchodilator while continuing to use Symbicort on a consistent basis.  She has joined [employer] and has started an exercise program over the course of the past week.  She remains away from consumption of all forms of seafood.  Her reflux is under pretty good control at this point in time while using omeprazole twice a day.  She has obtained 3 COVID vaccines and has received this year's flu vaccine.  Allergies as of 06/01/2021       Reactions   Ciprofloxacin Other (See Comments)   Contrast Media [iodinated Diagnostic Agents] Other (See Comments)   Patient breaks out in hives pt has had ct scans in the past with premeds and has not had reaction.   Shellfish Allergy Other (See Comments)   Other Nausea And Vomiting, Rash, Other (See Comments)   Seafood allergy        Medication List    albuterol 108 (90 Base) MCG/ACT inhaler Commonly known as: VENTOLIN HFA Inhale 2 puffs into the lungs every 4 (four) hours as needed for wheezing or shortness of breath.   EPINEPHrine 0.3 mg/0.3 mL Soaj injection Commonly known as: EPI-PEN Inject 0.3 mg into the muscle as needed for anaphylaxis. Use as directed for severe  allergic reaction   ergocalciferol 1.25 MG (50000 UT) capsule Commonly known as: VITAMIN D2 Take 50,000 Units by mouth once a week.   omeprazole 40 MG capsule Commonly known as: PRILOSEC Take 1 capsule (40 mg total) by mouth 2 (two) times daily.   pravastatin 20 MG tablet Commonly known as: PRAVACHOL TAKE 1 TABLET(20 MG) BY MOUTH DAILY   Symbicort 160-4.5 MCG/ACT inhaler Generic drug: budesonide-formoterol Inhale 2 puffs into the lungs 2 (two) times daily.    Past Medical History:  Diagnosis Date   Asthma    GERD (gastroesophageal reflux disease)    H. pylori infection    Hypercholesteremia    UTI (lower urinary tract infection)     Past Surgical History:  Procedure Laterality Date   ABDOMINAL HYSTERECTOMY     ABDOMINAL SURGERY     CESAREAN SECTION     CHOLECYSTECTOMY N/A 05/21/2018   Procedure: LAPAROSCOPIC CHOLECYSTECTOMY WITH ATTEMPTED INTRAOPERATIVE CHOLANGIOGRAM;  Surgeon: Luretha Murphy, MD;  Location: [facility];  Service: General;  Laterality: N/A;    Review of systems negative except as noted in HPI / PMHx or noted below:  Review of Systems  Constitutional: Negative.   HENT: Negative.    Eyes: Negative.   Respiratory: Negative.    Cardiovascular: Negative.   Gastrointestinal: Negative.   Genitourinary: Negative.   Musculoskeletal: Negative.   Skin: Negative.   Neurological: Negative.   Endo/Heme/Allergies: Negative.   Psychiatric/Behavioral:  Negative.      Objective:   Vitals:   06/01/21 1617  BP: 126/74  Pulse: 63  Resp: 16  Temp: 97.9 F (36.6 C)  SpO2: 98%   Height: 4' 0.43" (123 cm)  Weight: 119 lb 9.6 oz (54.3 kg)   Physical Exam Constitutional:      Appearance: She is not diaphoretic.  HENT:     Head: Normocephalic.     Right Ear: Tympanic membrane, ear canal and external ear normal.     Left Ear: Tympanic membrane, ear canal and external ear normal.     Nose: Nose normal. No mucosal edema or rhinorrhea.     Mouth/Throat:      Pharynx: Uvula midline. No oropharyngeal exudate.  Eyes:     Conjunctiva/sclera: Conjunctivae normal.  Neck:     Thyroid: No thyromegaly.     Trachea: Trachea normal. No tracheal tenderness or tracheal deviation.  Cardiovascular:     Rate and Rhythm: Normal rate and regular rhythm.     Heart sounds: Normal heart sounds, S1 normal and S2 normal. No murmur heard. Pulmonary:     Effort: No respiratory distress.     Breath sounds: Normal breath sounds. No stridor. No wheezing or rales.  Lymphadenopathy:     Head:     Right side of head: No tonsillar adenopathy.     Left side of head: No tonsillar adenopathy.     Cervical: No cervical adenopathy.  Skin:    Findings: No erythema or rash.     Nails: There is no clubbing.  Neurological:     Mental Status: She is alert.    Diagnostics:    Spirometry was performed and demonstrated an FEV1 of 1.93 at 102 % of predicted.  Assessment and Plan:   1. Asthma, severe persistent, well-controlled   2. Other allergic rhinitis   3. LPRD (laryngopharyngeal reflux disease)   4. Seafood allergy     1.  Continue to perform Allergen avoidance measures - No seafood  2.  Continue to Treat and prevent inflammation:   A.  Symbicort 160 - 2 inhalations twice a day with spacer  3.  Continue to Treat and prevent reflux:   A.  omeprazole 40mg  2 times per day   4.  If needed:   A.  Proventil HFA or similar 2 puffs every 4-6 hours  B.  Nasal saline  C.  Epi-Pen  5. Return to clinic in 6 months or earlier if problem  Shonica appears to be doing okay on her current therapy which includes anti-inflammatory agents for her airway utilized on a consistent basis and aggressive therapy directed against her reflux/LPR.  Assuming she continues to do well with this plan I will see her back in this clinic in 6 months or earlier if there is a problem.  , MD Allergy / Immunology [facility]

## 2021-06-02 ENCOUNTER — Encounter: Payer: Self-pay | Admitting: Allergy and Immunology

## 2021-06-02 NOTE — Congregational Nurse Program (Signed)
CN office visit.  She wants to apply for Medicaid and food stamps.  [employer] intern Nia Jones helped patient complete applications.  She also stated she has been having problems with her left leg.  She says there was an area at her left ankle and another below her left knee which were swollen and painful last week.  Some improvement but still pain sensitive in these areas. Scheduled appointment with PCP for next week.  Brantley Fling RN, Congregational Nurse [phone]

## 2021-06-02 NOTE — Addendum Note (Signed)
Addended by: Rolland Bimler D on: 06/02/2021 02:17 PM   Modules accepted: Orders

## 2021-06-09 ENCOUNTER — Ambulatory Visit: Payer: Medicare Other | Admitting: Nurse Practitioner

## 2021-06-10 ENCOUNTER — Telehealth: Payer: Medicare Other

## 2021-06-15 ENCOUNTER — Ambulatory Visit (INDEPENDENT_AMBULATORY_CARE_PROVIDER_SITE_OTHER): Payer: Medicare Other | Admitting: Nurse Practitioner

## 2021-06-15 ENCOUNTER — Encounter: Payer: Self-pay | Admitting: Nurse Practitioner

## 2021-06-15 ENCOUNTER — Other Ambulatory Visit: Payer: Self-pay

## 2021-06-15 VITALS — BP 118/62 | HR 60 | Temp 98.1°F | Ht 59.6 in | Wt 118.0 lb

## 2021-06-15 DIAGNOSIS — M1712 Unilateral primary osteoarthritis, left knee: Secondary | ICD-10-CM | POA: Diagnosis not present

## 2021-06-15 MED ORDER — DICLOFENAC SODIUM 1 % EX GEL: CUTANEOUS | 1 refills | Status: DC

## 2021-06-15 MED ORDER — TRIAMCINOLONE ACETONIDE 40 MG/ML IJ SUSP: 40.0000 mg | Freq: Once | INTRAMUSCULAR | Status: DC

## 2021-06-15 NOTE — Patient Instructions (Signed)
Pain Scale Information, Adult ?A pain scale is a tool to help you describe your pain. It often uses pictures, numbers, or words. It can help you explain to your health care provider: ?How bad your pain is. ?What your pain feels like, such as dull, achy, throbbing, or sharp. ?Where pain is located in your body. ?How often you have pain. ?Pain scales range from simple to complex and are often in the form of a survey for adults. Which pain scale your health care provider uses depends on your condition. Some pain scales measure only pain intensity. These can be useful if the cause of your pain is known. Other pain scales measure more factors, including if you can do your usual activities and how the pain is affecting your mood. These scales are useful if you have long-term (chronic) pain. ?How is a pain scale used? ?A pain scale is used to help your health care provider guide your treatment plan. It can be used in a health care setting or at home. In a health care setting, your health care provider will ask you the questions on the pain scale or have you fill out a form. At home, you will be asked to record your pain symptoms. If you have chronic pain, you may have to use the pain scale for several weeks or months. Recording your pain symptoms helps your health care provider see how your pain changes over time. ?Why is it important to communicate about pain? ?Talking about your pain helps your health care provider understand what you are feeling and how to treat your condition. Being in pain can make you feel unwell and unhappy. It can also interfere with your daily activities, such as work, school, or hobbies. Your relationships can also be affected. Your health care provider wants to help control your pain to improve your mood and daily life. ?What are some questions to ask my health care provider? ?How accurate are the results of this pain scale? ?How often should I use a pain scale? ?What is causing my pain? ?How  long will I need treatment? ?What are the risks of treatment with medicines? ?What other treatments can help? ?What if my pain does not go away with treatment? ?Should I keep a record of pain symptoms or pain scale results at home? ?Summary ?A pain scale is a tool to help you describe your pain. ?Talking about your pain helps your health care provider understand what you are feeling and how to treat your condition. ?Your health care provider wants to help control your pain to improve your mood and daily life. ?This information is not intended to replace advice given to you by your health care provider. Make sure you discuss any questions you have with your health care provider. ?Document Revised: 04/22/2019 Document Reviewed: 04/22/2019 ?Elsevier Patient Education ? 2022 [employer] ? ?

## 2021-06-15 NOTE — Progress Notes (Signed)
I,Tianna Badgett,acting as a Neurosurgeon for [employer], NP.,have documented all relevant documentation on the behalf of [employer], NP,as directed by  Charlesetta Ivory, NP while in the presence of Charlesetta Ivory, NP.  This visit occurred during the SARS-CoV-2 public health emergency.  Safety protocols were in place, including screening questions prior to the visit, additional usage of staff PPE, and extensive cleaning of exam room while observing appropriate contact time as indicated for disinfecting solutions.  Subjective:     Patient ID: Samantha [name] , female    DOB: 21-Oct-1940 , 80 y.o.   MRN: [id number]   Chief Complaint  Patient presents with   Knee Pain         HPI  Patient is here for knee pain. Patient is accompanied by interpreter Daih. She has had a history of arthritis in the left knee. Denies hitting her knee anywhere. Denies chest pain or SOB. She has been referred to PT and orthopedic in the past but she could not go due to her medicaid.   Knee Pain  The incident occurred more than 1 week ago. There was no injury mechanism. The pain is present in the left knee. The pain is at a severity of 9/10. The pain has been Worsening since onset. Associated symptoms include an inability to bear weight. Pertinent negatives include no numbness. She reports no foreign bodies present. The symptoms are aggravated by movement. She has tried immobilization for the symptoms. The treatment provided mild relief.    Past Medical History:  Diagnosis Date   Asthma    GERD (gastroesophageal reflux disease)    H. pylori infection    Hypercholesteremia    UTI (lower urinary tract infection)      Family History  Problem Relation Age of Onset   Cancer Mother    Asthma Father    Allergic rhinitis Father    Food Allergy Father    Urticaria Neg Hx    Eczema Neg Hx    Angioedema Neg Hx      Current Outpatient Medications:    albuterol (VENTOLIN HFA) 108 (90 Base) MCG/ACT inhaler,  Inhale 2 puffs into the lungs every 4 (four) hours as needed for wheezing or shortness of breath., Disp: 18 g, Rfl: 1   diclofenac Sodium (VOLTAREN) 1 % GEL, Apply to left knee 4 times daily as need., Disp: 4 g, Rfl: 1   EPINEPHrine 0.3 mg/0.3 mL IJ SOAJ injection, Inject 0.3 mg into the muscle as needed for anaphylaxis. Use as directed for severe allergic reaction, Disp: 2 each, Rfl: 2   ergocalciferol (VITAMIN D2) 1.25 MG (50000 UT) capsule, Take 50,000 Units by mouth once a week., Disp: , Rfl:    omeprazole (PRILOSEC) 40 MG capsule, Take 1 capsule (40 mg total) by mouth 2 (two) times daily., Disp: 60 capsule, Rfl: 5   pravastatin (PRAVACHOL) 20 MG tablet, TAKE 1 TABLET(20 MG) BY MOUTH DAILY, Disp: 90 tablet, Rfl: 1   SYMBICORT 160-4.5 MCG/ACT inhaler, Inhale 2 puffs into the lungs 2 (two) times daily., Disp: 10.2 g, Rfl: 5  Current Facility-Administered Medications:    triamcinolone acetonide (KENALOG-40) injection 40 mg, 40 mg, Intramuscular, Once, Haisley Arens, NP   Allergies  Allergen Reactions   Ciprofloxacin Other (See Comments)   Contrast Media [Iodinated Diagnostic Agents] Other (See Comments)    Patient breaks out in hives pt has had ct scans in the past with premeds and has not had reaction.   Shellfish Allergy Other (See Comments)  Other Nausea And Vomiting, Rash and Other (See Comments)    Seafood allergy     Review of Systems  Constitutional: Negative.  Negative for chills and fever.  HENT:  Negative for congestion.   Respiratory: Negative.  Negative for cough, shortness of breath and wheezing.   Cardiovascular: Negative.  Negative for chest pain and palpitations.  Gastrointestinal: Negative.  Negative for constipation and diarrhea.  Musculoskeletal:  Positive for arthralgias and joint swelling.       Left knee pain   Skin:  Negative for color change.  Neurological: Negative.  Negative for dizziness, weakness, numbness and headaches.    Today's Vitals   06/15/21  0952  BP: 118/62  Pulse: 60  Temp: 98.1 F (36.7 C)  TempSrc: Oral  Weight: 118 lb (53.5 kg)  Height: 4' 11.6" (1.514 m)  PainSc: 8    Body mass index is 23.36 kg/m.   Objective:  Physical Exam Constitutional:      Appearance: Normal appearance.  HENT:     Head: Normocephalic and atraumatic.  Cardiovascular:     Rate and Rhythm: Normal rate and regular rhythm.     Pulses: Normal pulses.     Heart sounds: Normal heart sounds. No murmur heard. Pulmonary:     Effort: Pulmonary effort is normal. No respiratory distress.     Breath sounds: Normal breath sounds. No wheezing.  Musculoskeletal:        General: Tenderness present.     Right knee: Normal.     Left knee: Swelling present. No deformity. Tenderness present over the patellar tendon.  Skin:    General: Skin is warm and dry.     Capillary Refill: Capillary refill takes less than 2 seconds.  Neurological:     Mental Status: She is alert and oriented to person, place, and time.        Assessment And Plan:     1. Primary osteoarthritis of left knee - diclofenac Sodium (VOLTAREN) 1 % GEL; Apply to left knee 4 times daily as need.  Dispense: 4 g; Refill: 1 - triamcinolone acetonide (KENALOG-40) injection 40 mg - AMB referral to orthopedics  -Advised patient to use Voltaren gel as needed.  -Recommended PT for the patient.   The patient was encouraged to call or send a message through MyChart for any questions or concerns.   Follow up: if symptoms persist or do not get better.   Side effects and appropriate use of all the medication(s) were discussed with the patient today. Patient advised to use the medication(s) as directed by their healthcare provider. The patient was encouraged to read, review, and understand all associated package inserts and contact our office with any questions or concerns. The patient accepts the risks of the treatment plan and had an opportunity to ask questions.   Patient was given opportunity  to ask questions. Patient verbalized understanding of the plan and was able to repeat key elements of the plan. All questions were answered to their satisfaction.  Raman Altheia Shafran, DNP   I, Raman Keshaun Dubey have reviewed all documentation for this visit. The documentation on 06/15/21 for the exam, diagnosis, procedures, and orders are all accurate and complete.    IF YOU HAVE BEEN REFERRED TO A SPECIALIST, IT MAY TAKE 1-2 WEEKS TO SCHEDULE/PROCESS THE REFERRAL. IF YOU HAVE NOT HEARD FROM US/SPECIALIST IN TWO WEEKS, PLEASE GIVE [place] A CALL AT [phone].   THE PATIENT IS ENCOURAGED TO PRACTICE SOCIAL DISTANCING DUE TO THE COVID-19 PANDEMIC.

## 2021-06-16 ENCOUNTER — Ambulatory Visit: Payer: Medicare Other

## 2021-06-16 DIAGNOSIS — M1712 Unilateral primary osteoarthritis, left knee: Secondary | ICD-10-CM

## 2021-06-16 DIAGNOSIS — E559 Vitamin D deficiency, unspecified: Secondary | ICD-10-CM

## 2021-06-16 DIAGNOSIS — E7800 Pure hypercholesterolemia, unspecified: Secondary | ICD-10-CM

## 2021-06-16 NOTE — Patient Instructions (Signed)
Social Worker Visit Information  Goals we discussed today:   Goals Addressed             This Visit's Progress    COMPLETED: Quality of Life Maintained       Timeframe:  Long-Range Goal Priority:  Medium Start Date:    10.17.22                                            Patient Goals/Self-Care Activities patient will: With the help of her son  - Engage with primary care provider as needed to address health care related needs -Contact SW as needed prior to next scheduled call         Patient verbalizes understanding of instructions provided today and agrees to view in MyChart.   Follow Up Plan:  No SW follow up planned at this time. Please contact me as needed.   Bevelyn Ngo, BSW, CDP Social Worker, Certified Dementia Practitioner [facility] Care Management [phone]

## 2021-06-16 NOTE — Congregational Nurse Program (Signed)
CN office visit with interpreter Diu Hartshorn assisting.  Patient came to finish Medicaid application to include her husband.  Completed paperwork given to patient to mail.  States she saw doctor for her knee pain and that the medicine she was given is helping. Brantley Fling RN, Congregational Nurse [phone]

## 2021-06-16 NOTE — Chronic Care Management (AMB) (Signed)
Chronic Care Management    Social Work Note  06/16/2021 Name: Samantha [name] MRN: [id number] DOB: [birth year]  Samantha [name] is a 80 y.o. year old female who is a primary care patient of Dorothyann Peng, MD. The CCM team was consulted to assist the patient with chronic disease management and/or care coordination needs related to:  pure hypercholesterolemia, vitamin D deficiency, primary osteoarthritis of left knee .   Engaged with patients son by phone  for follow up visit in response to provider referral for social work chronic care management and care coordination services.   Consent to Services:  The patient was given information about Chronic Care Management services, agreed to services, and gave verbal consent prior to initiation of services.  Please see initial visit note for detailed documentation.   Patient agreed to services and consent obtained.   Assessment: Review of patient past medical history, allergies, medications, and health status, including review of relevant consultants reports was performed today as part of a comprehensive evaluation and provision of chronic care management and care coordination services.     SDOH (Social Determinants of Health) assessments and interventions performed:    Advanced Directives Status: Not addressed in this encounter.  CCM Care Plan  Allergies  Allergen Reactions   Ciprofloxacin Other (See Comments)   Contrast Media [Iodinated Diagnostic Agents] Other (See Comments)    Patient breaks out in hives pt has had ct scans in the past with premeds and has not had reaction.   Shellfish Allergy Other (See Comments)   Other Nausea And Vomiting, Rash and Other (See Comments)    Seafood allergy    Outpatient Encounter Medications as of 06/16/2021  Medication Sig   albuterol (VENTOLIN HFA) 108 (90 Base) MCG/ACT inhaler Inhale 2 puffs into the lungs every 4 (four) hours as needed for wheezing or shortness of breath.   diclofenac Sodium (VOLTAREN) 1 %  GEL Apply to left knee 4 times daily as need.   EPINEPHrine 0.3 mg/0.3 mL IJ SOAJ injection Inject 0.3 mg into the muscle as needed for anaphylaxis. Use as directed for severe allergic reaction   ergocalciferol (VITAMIN D2) 1.25 MG (50000 UT) capsule Take 50,000 Units by mouth once a week.   omeprazole (PRILOSEC) 40 MG capsule Take 1 capsule (40 mg total) by mouth 2 (two) times daily.   pravastatin (PRAVACHOL) 20 MG tablet TAKE 1 TABLET(20 MG) BY MOUTH DAILY   SYMBICORT 160-4.5 MCG/ACT inhaler Inhale 2 puffs into the lungs 2 (two) times daily.   Facility-Administered Encounter Medications as of 06/16/2021  Medication   triamcinolone acetonide (KENALOG-40) injection 40 mg    Patient Active Problem List   Diagnosis Date Noted   Chronic pain of left knee 05/11/2021   Pure hypercholesterolemia 05/11/2021   Vitamin D deficiency disease 05/11/2021   Primary osteoarthritis of left knee 09/13/2020   Lumbar radiculopathy, chronic 09/13/2020   Abnormal ultrasound of abdomen 09/13/2020   S/P laparoscopic cholecystectomy 05/21/2018   Chronic venous insufficiency 05/01/2015   Varicose veins of lower extremities with complications 05/01/2015   Periumbilical abdominal pain 04/13/2015   Colitis 12/19/2012   Dyslipidemia 12/19/2012   Asthma 02/15/2008   SHORTNESS OF BREATH (SOB) 02/15/2008   CHEST PAIN-UNSPECIFIED 02/15/2008    Conditions to be addressed/monitored:  pure hypercholesterolemia, vitamin D deficiency, primary osteoarthritis of left knee ; Limited access to food  Care Plan : Social Work [facility] Care Plan  Updates made by Bevelyn Ngo since 06/16/2021 12:00 AM  Completed 06/16/2021   Problem: Quality  of Life (General Plan of Care) Resolved 06/16/2021     Goal: Quality of Life Maintained Completed 06/16/2021  Start Date: 04/19/2021  Priority: Medium  Note:   Current Barriers:  Chronic disease management support and education needs related to  Pure Hypercholesterolemia, Vitamin D  Deficiency, Primary Osteoarthritis of Left Knee   Financial constraints related to fixed income Limited knowledge of resources to assist low income individuals  Social Worker Clinical Goal(s):  patient will work with SW to identify and address any acute and/or chronic care coordination needs related to the self health management of  Pure Hypercholesterolemia, Vitamin D Deficiency, Primary Osteoarthritis of Left Knee   explore community resource options for unmet needs related to:  Sales promotion account executive  and Food Insecurity  SW Interventions:  Inter-disciplinary care team collaboration (see longitudinal plan of care) Collaboration with Glendale Chard, MD regarding development and update of comprehensive plan of care as evidenced by provider attestation and co-signature Successful outbound call placed to patients son Samantha [name] to confirm receipt of mailed resources Confirmed patient did receive resource applications and has completed them Assessed for alternative care coordination needs - none identified at this time Encouraged patients son to contact SW as needed with future resource needs  Patient Goals/Self-Care Activities patient will: With the help of her son  - Engage with primary care provider as needed to address health care related needs -Contact SW as needed prior to next scheduled call       Follow Up Plan:  No SW follow up planned at this time. The patient and her son are encouraged to contact SW as needed.      Daneen Schick, BSW, CDP Social Worker, Certified Dementia Practitioner [place] [facility] [phone]

## 2021-06-17 ENCOUNTER — Other Ambulatory Visit: Payer: Self-pay

## 2021-06-17 ENCOUNTER — Ambulatory Visit (INDEPENDENT_AMBULATORY_CARE_PROVIDER_SITE_OTHER): Payer: Medicare Other

## 2021-06-17 VITALS — BP 110/60 | HR 65 | Temp 97.6°F | Ht 59.6 in | Wt 115.4 lb

## 2021-06-17 DIAGNOSIS — Z0000 Encounter for general adult medical examination without abnormal findings: Secondary | ICD-10-CM | POA: Diagnosis not present

## 2021-06-17 NOTE — Progress Notes (Signed)
This visit occurred during the SARS-CoV-2 public health emergency.  Safety protocols were in place, including screening questions prior to the visit, additional usage of staff PPE, and extensive cleaning of exam room while observing appropriate contact time as indicated for disinfecting solutions.  Subjective:   Samantha [name] is a 80 y.o. female who presents for Medicare Annual (Subsequent) preventive examination.  Review of Systems     Cardiac Risk Factors include: advanced age (>60men, >61 women)     Objective:    Today's Vitals   06/17/21 0830  BP: 110/60  Pulse: 65  Temp: 97.6 F (36.4 C)  TempSrc: Oral  SpO2: 97%  Weight: 115 lb 6.4 oz (52.3 kg)  Height: 4' 11.6" (1.514 m)   Body mass index is 22.84 kg/m.  Advanced Directives 06/17/2021 06/11/2020 06/06/2019 09/27/2018 05/21/2018 05/21/2018 05/18/2018  Does Patient Have a Medical Advance Directive? Yes No No No Yes Yes No  Type of Advance Directive Living will - - - Healthcare Power of [place];Living will Healthcare Power of [place];Living will -  Does patient want to make changes to medical advance directive? - - - - - No - Patient declined -  Copy of Healthcare Power of Attorney in Chart? - - - - No - copy requested No - copy requested -  Would patient like information on creating a medical advance directive? - No - Patient declined - Yes (ED - Information included in AVS) No - Patient declined No - Patient declined -  Pre-existing out of facility DNR order (yellow form or pink MOST form) - - - - - - -    Current Medications (verified) Outpatient Encounter Medications as of 06/17/2021  Medication Sig   albuterol (VENTOLIN HFA) 108 (90 Base) MCG/ACT inhaler Inhale 2 puffs into the lungs every 4 (four) hours as needed for wheezing or shortness of breath.   diclofenac Sodium (VOLTAREN) 1 % GEL Apply to left knee 4 times daily as need.   EPINEPHrine 0.3 mg/0.3 mL IJ SOAJ injection Inject 0.3 mg into the muscle as needed for  anaphylaxis. Use as directed for severe allergic reaction   ergocalciferol (VITAMIN D2) 1.25 MG (50000 UT) capsule Take 50,000 Units by mouth once a week.   omeprazole (PRILOSEC) 40 MG capsule Take 1 capsule (40 mg total) by mouth 2 (two) times daily.   pravastatin (PRAVACHOL) 20 MG tablet TAKE 1 TABLET(20 MG) BY MOUTH DAILY   SYMBICORT 160-4.5 MCG/ACT inhaler Inhale 2 puffs into the lungs 2 (two) times daily.   [DISCONTINUED] EPINEPHrine 0.3 mg/0.3 mL IJ SOAJ injection Use as directed for severe allergic reaction   [DISCONTINUED] omeprazole (PRILOSEC) 40 MG capsule TAKE 1 CAPSULE(40 MG) BY MOUTH TWICE DAILY   [DISCONTINUED] pravastatin (PRAVACHOL) 20 MG tablet TAKE 1 TABLET(20 MG) BY MOUTH DAILY   [DISCONTINUED] PROAIR HFA 108 (90 Base) MCG/ACT inhaler INHALE 2 PUFFS INTO THE LUNGS EVERY 6 HOURS AS NEEDED FOR WHEEZING OR SHORTNESS OF BREATH   [DISCONTINUED] SYMBICORT 160-4.5 MCG/ACT inhaler INHALE 2 PUFFS INTO THE LUNGS TWICE DAILY   [DISCONTINUED] Vitamin D, Ergocalciferol, (DRISDOL) 1.25 MG (50000 UNIT) CAPS capsule TAKE 1 CAPSULE BY MOUTH TWICE WEEKLY ON TUESDAYS/FRIDAYS (Patient not taking: Reported on 03/30/2021)   Facility-Administered Encounter Medications as of 06/17/2021  Medication   triamcinolone acetonide (KENALOG-40) injection 40 mg    Allergies (verified) Ciprofloxacin, Contrast media [iodinated diagnostic agents], Shellfish allergy, and Other   History: Past Medical History:  Diagnosis Date   Asthma    GERD (gastroesophageal reflux disease)  H. pylori infection    Hypercholesteremia    UTI (lower urinary tract infection)    Past Surgical History:  Procedure Laterality Date   ABDOMINAL HYSTERECTOMY     ABDOMINAL SURGERY     CESAREAN SECTION     CHOLECYSTECTOMY N/A 05/21/2018   Procedure: LAPAROSCOPIC CHOLECYSTECTOMY WITH ATTEMPTED INTRAOPERATIVE CHOLANGIOGRAM;  Surgeon: Luretha Murphy, MD;  Location: [facility];  Service: General;  Laterality: N/A;   Family History   Problem Relation Age of Onset   Cancer Mother    Asthma Father    Allergic rhinitis Father    Food Allergy Father    Urticaria Neg Hx    Eczema Neg Hx    Angioedema Neg Hx    Social History   Socioeconomic History   Marital status: Married    Spouse name: Not on file   Number of children: Not on file   Years of education: Not on file   Highest education level: Not on file  Occupational History   Occupation: retired  Tobacco Use   Smoking status: Never   Smokeless tobacco: Never  Vaping Use   Vaping Use: Never used  Substance and Sexual Activity   Alcohol use: No    Alcohol/week: 0.0 standard drinks   Drug use: No   Sexual activity: Not Currently  Other Topics Concern   Not on file  Social History Narrative   Not on file   Social Determinants of Health   Financial Resource Strain: Low Risk    Difficulty of Paying Living Expenses: Not very hard  Food Insecurity: No Food Insecurity   Worried About Programme researcher, broadcasting/film/video in the Last Year: Never true   Ran Out of Food in the Last Year: Never true  Transportation Needs: No Transportation Needs   Lack of Transportation (Medical): No   Lack of Transportation (Non-Medical): No  Physical Activity: Insufficiently Active   Days of Exercise per Week: 3 days   Minutes of Exercise per Session: 30 min  Stress: No Stress Concern Present   Feeling of Stress : Only a little  Social Connections: Not on file    Tobacco Counseling Counseling given: Not Answered   Clinical Intake:  Pre-visit preparation completed: Yes  Pain : No/denies pain     Nutritional Status: BMI of 19-24  Normal Nutritional Risks: None Diabetes: No  How often do you need to have someone help you when you read instructions, pamphlets, or other written materials from your doctor or pharmacy?: 3 - Sometimes  Diabetic? no  Interpreter Needed?: Yes Interpreter Agency: Haroldine Laws Interpreter Name: Daih  Information entered by :: NAllen  LPN   Activities of Daily Living In your present state of health, do you have any difficulty performing the following activities: 06/17/2021 06/15/2021  Hearing? N N  Vision? N N  Difficulty concentrating or making decisions? N N  Walking or climbing stairs? N Y  Dressing or bathing? N N  Doing errands, shopping? N N  Preparing Food and eating ? N -  Using the Toilet? N -  In the past six months, have you accidently leaked urine? N -  Do you have problems with loss of bowel control? N -  Managing your Medications? N -  Managing your Finances? N -  Housekeeping or managing your Housekeeping? N -  Some recent data might be hidden    Patient Care Team: Dorothyann Peng, MD as PCP - General (Internal Medicine) Bevelyn Ngo as [facility] Care Management  Indicate  any recent Medical Services you may have received from other than [facility] providers in the past year (date may be approximate).     Assessment:   This is a routine wellness examination for Kashawna.  Hearing/Vision screen Vision Screening - Comments:: No regular eye exams,   Dietary issues and exercise activities discussed: Current Exercise Habits: Home exercise routine, Type of exercise: treadmill, Time (Minutes): 30, Frequency (Times/Week): 3, Weekly Exercise (Minutes/Week): 90   Goals Addressed             This Visit's Progress    Patient Stated       06/17/2021, wants to get strong and healthy       Depression Screen PHQ 2/9 Scores 06/17/2021 06/15/2021 06/11/2020 12/10/2019 06/06/2019 08/28/2018 05/17/2018  PHQ - 2 Score 0 0 0 0 0 0 0  PHQ- 9 Score - 0 - - 0 - -    Fall Risk Fall Risk  06/17/2021 06/15/2021 06/11/2020 12/10/2019 06/06/2019  Falls in the past year? 0 0 0 0 0  Number falls in past yr: - 0 - 0 -  Injury with Fall? - 0 - 0 -  Risk for fall due to : Medication side effect - No Fall Risks - -  Follow up Falls evaluation completed;Education provided;Falls prevention discussed - Falls  evaluation completed;Education provided;Falls prevention discussed - Falls evaluation completed;Education provided;Falls prevention discussed    FALL RISK PREVENTION PERTAINING TO THE HOME:  Any stairs in or around the home? Yes  If so, are there any without handrails? No  Home free of loose throw rugs in walkways, pet beds, electrical cords, etc? Yes  Adequate lighting in your home to reduce risk of falls? Yes   ASSISTIVE DEVICES UTILIZED TO PREVENT FALLS:  Life alert? No  Use of a cane, walker or w/c? No  Grab bars in the bathroom? Yes  Shower chair or bench in shower? No  Elevated toilet seat or a handicapped toilet? No   TIMED UP AND GO:  Was the test performed? No .    Gait steady and fast without use of assistive device  Cognitive Function:     6CIT Screen 05/17/2018  What Year? 0 points  What month? 0 points  What time? 0 points  Count back from 20 0 points  Months in reverse 0 points  Repeat phrase 8 points  Total Score 8    Immunizations Immunization History  Administered Date(s) Administered   Fluad Quad(high Dose 65+) 04/14/2019, 06/11/2020, 03/30/2021   PFIZER(Purple Top)SARS-COV-2 Vaccination 10/28/2019, 11/08/2019   Pneumococcal Conjugate-13 06/10/2019   Pneumococcal-Unspecified 12/19/2005   Tdap 05/24/2017    TDAP status: Up to date  Flu Vaccine status: Up to date  Pneumococcal vaccine status: Up to date  Covid-19 vaccine status: Completed vaccines  Qualifies for Shingles Vaccine? Yes   Zostavax completed No   Shingrix Completed?: No.    Education has been provided regarding the importance of this vaccine. Patient has been advised to call insurance company to determine out of pocket expense if they have not yet received this vaccine. Advised may also receive vaccine at local pharmacy or Health Dept. Verbalized acceptance and understanding.  Screening Tests Health Maintenance  Topic Date Due   Pneumonia Vaccine 76+ Years old (2 - PPSV23 if  available, else PCV20) 06/09/2020   COVID-19 Vaccine (3 - Mixed Product risk series) 07/03/2021 (Originally 12/06/2019)   Zoster Vaccines- Shingrix (1 of 2) 09/15/2021 (Originally 07/03/1960)   TETANUS/TDAP  05/25/2027   INFLUENZA VACCINE  Completed   DEXA SCAN  Completed   Hepatitis C Screening  Completed   HPV VACCINES  Aged Out    Health Maintenance  Health Maintenance Due  Topic Date Due   Pneumonia Vaccine 16+ Years old (2 - PPSV23 if available, else PCV20) 06/09/2020    Colorectal cancer screening: No longer required.   Mammogram status: No longer required due to age.  Bone Density status: Completed 06/01/2016.   Lung Cancer Screening: (Low Dose CT Chest recommended if Age 26-80 years, 30 pack-year currently smoking OR have quit w/in 15years.) does not qualify.   Lung Cancer Screening Referral: no  Additional Screening:  Hepatitis C Screening: does qualify; Completed 12/10/2019  Vision Screening: Recommended annual ophthalmology exams for early detection of glaucoma and other disorders of the eye. Is the patient up to date with their annual eye exam?  No  Who is the provider or what is the name of the office in which the patient attends annual eye exams? none If pt is not established with a provider, would they like to be referred to a provider to establish care? No .   Dental Screening: Recommended annual dental exams for proper oral hygiene  Community Resource Referral / Chronic Care Management: CRR required this visit?  No   CCM required this visit?  No      Plan:     I have personally reviewed and noted the following in the patients chart:   Medical and social history Use of alcohol, tobacco or illicit drugs  Current medications and supplements including opioid prescriptions.  Functional ability and status Nutritional status Physical activity Advanced directives List of other physicians Hospitalizations, surgeries, and ER visits in previous 12  months Vitals Screenings to include cognitive, depression, and falls Referrals and appointments  In addition, I have reviewed and discussed with patient certain preventive protocols, quality metrics, and best practice recommendations. A written personalized care plan for preventive services as well as general preventive health recommendations were provided to patient.     Barb Merino, LPN   [id number]   Nurse Notes: 6 CIT not administered due to language barrier.

## 2021-06-17 NOTE — Patient Instructions (Signed)
Samantha [name] , Thank you for taking time to come for your Medicare Wellness Visit. I appreciate your ongoing commitment to your health goals. Please review the following plan we discussed and let me know if I can assist you in the future.   Screening recommendations/referrals: Colonoscopy: not required Mammogram: not required Bone Density: completed 06/01/2016 Recommended yearly ophthalmology/optometry visit for glaucoma screening and checkup Recommended yearly dental visit for hygiene and checkup  Vaccinations: Influenza vaccine: completed 03/30/2021 Pneumococcal vaccine: due Tdap vaccine: completed 05/24/2017, due 05/25/2027 Shingles vaccine: decline   Covid-19: 11/08/2019, 10/28/2019  Advanced directives: Please bring a copy of your POA (Power of Attorney) and/or Living Will to your next appointment.   Conditions/risks identified: none  Next appointment: Follow up in one year for your annual wellness visit    Preventive Care 65 Years and Older, Female Preventive care refers to lifestyle choices and visits with your health care provider that can promote health and wellness. What does preventive care include? A yearly physical exam. This is also called an annual well check. Dental exams once or twice a year. Routine eye exams. Ask your health care provider how often you should have your eyes checked. Personal lifestyle choices, including: Daily care of your teeth and gums. Regular physical activity. Eating a healthy diet. Avoiding tobacco and drug use. Limiting alcohol use. Practicing safe sex. Taking low-dose aspirin every day. Taking vitamin and mineral supplements as recommended by your health care provider. What happens during an annual well check? The services and screenings done by your health care provider during your annual well check will depend on your age, overall health, lifestyle risk factors, and family history of disease. Counseling  Your health care provider may ask  you questions about your: Alcohol use. Tobacco use. Drug use. Emotional well-being. Home and relationship well-being. Sexual activity. Eating habits. History of falls. Memory and ability to understand (cognition). Work and work Astronomer. Reproductive health. Screening  You may have the following tests or measurements: Height, weight, and BMI. Blood pressure. Lipid and cholesterol levels. These may be checked every 5 years, or more frequently if you are over 25 years old. Skin check. Lung cancer screening. You may have this screening every year starting at age 66 if you have a 30-pack-year history of smoking and currently smoke or have quit within the past 15 years. Fecal occult blood test (FOBT) of the stool. You may have this test every year starting at age 25. Flexible sigmoidoscopy or colonoscopy. You may have a sigmoidoscopy every 5 years or a colonoscopy every 10 years starting at age 36. Hepatitis C blood test. Hepatitis B blood test. Sexually transmitted disease (STD) testing. Diabetes screening. This is done by checking your blood sugar (glucose) after you have not eaten for a while (fasting). You may have this done every 1-3 years. Bone density scan. This is done to screen for osteoporosis. You may have this done starting at age 8. Mammogram. This may be done every 1-2 years. Talk to your health care provider about how often you should have regular mammograms. Talk with your health care provider about your test results, treatment options, and if necessary, the need for more tests. Vaccines  Your health care provider may recommend certain vaccines, such as: Influenza vaccine. This is recommended every year. Tetanus, diphtheria, and acellular pertussis (Tdap, Td) vaccine. You may need a Td booster every 10 years. Zoster vaccine. You may need this after age 52. Pneumococcal 13-valent conjugate (PCV13) vaccine. One dose is recommended  after age 12. Pneumococcal  polysaccharide (PPSV23) vaccine. One dose is recommended after age 54. Talk to your health care provider about which screenings and vaccines you need and how often you need them. This information is not intended to replace advice given to you by your health care provider. Make sure you discuss any questions you have with your health care provider. Document Released: 07/17/2015 Document Revised: 03/09/2016 Document Reviewed: 04/21/2015 Elsevier Interactive Patient Education  2017 [employer] Prevention in the Home Falls can cause injuries. They can happen to people of all ages. There are many things you can do to make your home safe and to help prevent falls. What can I do on the outside of my home? Regularly fix the edges of walkways and driveways and fix any cracks. Remove anything that might make you trip as you walk through a door, such as a raised step or threshold. Trim any bushes or trees on the path to your home. Use bright outdoor lighting. Clear any walking paths of anything that might make someone trip, such as rocks or tools. Regularly check to see if handrails are loose or broken. Make sure that both sides of any steps have handrails. Any raised decks and porches should have guardrails on the edges. Have any leaves, snow, or ice cleared regularly. Use sand or salt on walking paths during winter. Clean up any spills in your garage right away. This includes oil or grease spills. What can I do in the bathroom? Use night lights. Install grab bars by the toilet and in the tub and shower. Do not use towel bars as grab bars. Use non-skid mats or decals in the tub or shower. If you need to sit down in the shower, use a plastic, non-slip stool. Keep the floor dry. Clean up any water that spills on the floor as soon as it happens. Remove soap buildup in the tub or shower regularly. Attach bath mats securely with double-sided non-slip rug tape. Do not have throw rugs and other  things on the floor that can make you trip. What can I do in the bedroom? Use night lights. Make sure that you have a light by your bed that is easy to reach. Do not use any sheets or blankets that are too big for your bed. They should not hang down onto the floor. Have a firm chair that has side arms. You can use this for support while you get dressed. Do not have throw rugs and other things on the floor that can make you trip. What can I do in the kitchen? Clean up any spills right away. Avoid walking on wet floors. Keep items that you use a lot in easy-to-reach places. If you need to reach something above you, use a strong step stool that has a grab bar. Keep electrical cords out of the way. Do not use floor polish or wax that makes floors slippery. If you must use wax, use non-skid floor wax. Do not have throw rugs and other things on the floor that can make you trip. What can I do with my stairs? Do not leave any items on the stairs. Make sure that there are handrails on both sides of the stairs and use them. Fix handrails that are broken or loose. Make sure that handrails are as long as the stairways. Check any carpeting to make sure that it is firmly attached to the stairs. Fix any carpet that is loose or worn. Avoid having throw  rugs at the top or bottom of the stairs. If you do have throw rugs, attach them to the floor with carpet tape. Make sure that you have a light switch at the top of the stairs and the bottom of the stairs. If you do not have them, ask someone to add them for you. What else can I do to help prevent falls? Wear shoes that: Do not have high heels. Have rubber bottoms. Are comfortable and fit you well. Are closed at the toe. Do not wear sandals. If you use a stepladder: Make sure that it is fully opened. Do not climb a closed stepladder. Make sure that both sides of the stepladder are locked into place. Ask someone to hold it for you, if possible. Clearly  mark and make sure that you can see: Any grab bars or handrails. First and last steps. Where the edge of each step is. Use tools that help you move around (mobility aids) if they are needed. These include: Canes. Walkers. Scooters. Crutches. Turn on the lights when you go into a dark area. Replace any light bulbs as soon as they burn out. Set up your furniture so you have a clear path. Avoid moving your furniture around. If any of your floors are uneven, fix them. If there are any pets around you, be aware of where they are. Review your medicines with your doctor. Some medicines can make you feel dizzy. This can increase your chance of falling. Ask your doctor what other things that you can do to help prevent falls. This information is not intended to replace advice given to you by your health care provider. Make sure you discuss any questions you have with your health care provider. Document Released: 04/16/2009 Document Revised: 11/26/2015 Document Reviewed: 07/25/2014 Elsevier Interactive Patient Education  2017 [employer].

## 2021-06-24 ENCOUNTER — Ambulatory Visit (INDEPENDENT_AMBULATORY_CARE_PROVIDER_SITE_OTHER): Payer: Medicare Other | Admitting: Internal Medicine

## 2021-06-24 ENCOUNTER — Other Ambulatory Visit: Payer: Self-pay

## 2021-06-24 ENCOUNTER — Encounter: Payer: Self-pay | Admitting: Internal Medicine

## 2021-06-24 VITALS — BP 118/72 | Temp 98.2°F | Ht 60.2 in | Wt 117.2 lb

## 2021-06-24 DIAGNOSIS — R1013 Epigastric pain: Secondary | ICD-10-CM | POA: Diagnosis not present

## 2021-06-24 DIAGNOSIS — Z23 Encounter for immunization: Secondary | ICD-10-CM

## 2021-06-24 DIAGNOSIS — Z0000 Encounter for general adult medical examination without abnormal findings: Secondary | ICD-10-CM | POA: Diagnosis not present

## 2021-06-24 DIAGNOSIS — E7800 Pure hypercholesterolemia, unspecified: Secondary | ICD-10-CM | POA: Diagnosis not present

## 2021-06-24 DIAGNOSIS — R202 Paresthesia of skin: Secondary | ICD-10-CM

## 2021-06-24 DIAGNOSIS — Z79899 Other long term (current) drug therapy: Secondary | ICD-10-CM

## 2021-06-24 NOTE — Patient Instructions (Signed)
Health Maintenance After Age 80 After age 80, you are at a higher risk for certain long-term diseases and infections as well as injuries from falls. Falls are a major cause of broken bones and head injuries in people who are older than age 80. Getting regular preventive care can help to keep you healthy and well. Preventive care includes getting regular testing and making lifestyle changes as recommended by your health care provider. Talk with your health care provider about: Which screenings and tests you should have. A screening is a test that checks for a disease when you have no symptoms. A diet and exercise plan that is right for you. What should I know about screenings and tests to prevent falls? Screening and testing are the best ways to find a health problem early. Early diagnosis and treatment give you the best chance of managing medical conditions that are common after age 80. Certain conditions and lifestyle choices may make you more likely to have a fall. Your health care provider may recommend: Regular vision checks. Poor vision and conditions such as cataracts can make you more likely to have a fall. If you wear glasses, make sure to get your prescription updated if your vision changes. Medicine review. Work with your health care provider to regularly review all of the medicines you are taking, including over-the-counter medicines. Ask your health care provider about any side effects that may make you more likely to have a fall. Tell your health care provider if any medicines that you take make you feel dizzy or sleepy. Strength and balance checks. Your health care provider may recommend certain tests to check your strength and balance while standing, walking, or changing positions. Foot health exam. Foot pain and numbness, as well as not wearing proper footwear, can make you more likely to have a fall. Screenings, including: Osteoporosis screening. Osteoporosis is a condition that causes  the bones to get weaker and break more easily. Blood pressure screening. Blood pressure changes and medicines to control blood pressure can make you feel dizzy. Depression screening. You may be more likely to have a fall if you have a fear of falling, feel depressed, or feel unable to do activities that you used to do. Alcohol use screening. Using too much alcohol can affect your balance and may make you more likely to have a fall. Follow these instructions at home: Lifestyle Do not drink alcohol if: Your health care provider tells you not to drink. If you drink alcohol: Limit how much you have to: 0-1 drink a day for women. 0-2 drinks a day for men. Know how much alcohol is in your drink. In the [place] one drink equals one 12 oz bottle of beer (355 mL), one 5 oz glass of wine (148 mL), or one 1 oz glass of hard liquor (44 mL). Do not use any products that contain nicotine or tobacco. These products include cigarettes, chewing tobacco, and vaping devices, such as e-cigarettes. If you need help quitting, ask your health care provider. Activity  Follow a regular exercise program to stay fit. This will help you maintain your balance. Ask your health care provider what types of exercise are appropriate for you. If you need a cane or walker, use it as recommended by your health care provider. Wear supportive shoes that have nonskid soles. Safety  Remove any tripping hazards, such as rugs, cords, and clutter. Install safety equipment such as grab bars in bathrooms and safety rails on stairs. Keep rooms and walkways   well-lit. General instructions Talk with your health care provider about your risks for falling. Tell your health care provider if: You fall. Be sure to tell your health care provider about all falls, even ones that seem minor. You feel dizzy, tiredness (fatigue), or off-balance. Take over-the-counter and prescription medicines only as told by your health care provider. These include  supplements. Eat a healthy diet and maintain a healthy weight. A healthy diet includes low-fat dairy products, low-fat (lean) meats, and fiber from whole grains, beans, and lots of fruits and vegetables. Stay current with your vaccines. Schedule regular health, dental, and eye exams. Summary Having a healthy lifestyle and getting preventive care can help to protect your health and wellness after age 80. Screening and testing are the best way to find a health problem early and help you avoid having a fall. Early diagnosis and treatment give you the best chance for managing medical conditions that are more common for people who are older than age 80. Falls are a major cause of broken bones and head injuries in people who are older than age 80. Take precautions to prevent a fall at home. Work with your health care provider to learn what changes you can make to improve your health and wellness and to prevent falls. This information is not intended to replace advice given to you by your health care provider. Make sure you discuss any questions you have with your health care provider. Document Revised: 11/09/2020 Document Reviewed: 11/09/2020 Elsevier Patient Education  2022 Elsevier Inc.  

## 2021-06-24 NOTE — Progress Notes (Signed)
I,Katawbba Wiggins,acting as a Education administrator for Maximino Greenland, MD.,have documented all relevant documentation on the behalf of Maximino Greenland, MD,as directed by  Maximino Greenland, MD while in the presence of Maximino Greenland, MD.  This visit occurred during the SARS-CoV-2 public health emergency.  Safety protocols were in place, including screening questions prior to the visit, additional usage of staff PPE, and extensive cleaning of exam room while observing appropriate contact time as indicated for disinfecting solutions.  Subjective:     Patient ID: Samantha [name] , female    DOB: Aug 24, 1940 , 80 y.o.   MRN: [id number]   Chief Complaint  Patient presents with   Annual Exam    HPI  She presents today for physical exam.  She has an interpreter.  She reports compliance with medications. The patient states she has headaches and stomach aches sometimes but not today. She is fine today. She adds that she has been to GI for further evaluation of her occasional abdominal discomfort, prescribed meds for reflux. She states these are effective.    Hyperlipidemia This is a chronic problem. The problem is controlled. There are no known factors aggravating her hyperlipidemia. Current antihyperlipidemic treatment includes statins. The current treatment provides moderate improvement of lipids. Risk factors for coronary artery disease include dyslipidemia, post-menopausal and a sedentary lifestyle.    Past Medical History:  Diagnosis Date   Asthma    GERD (gastroesophageal reflux disease)    H. pylori infection    Hypercholesteremia    UTI (lower urinary tract infection)      Family History  Problem Relation Age of Onset   Cancer Mother    Asthma Father    Allergic rhinitis Father    Food Allergy Father    Urticaria Neg Hx    Eczema Neg Hx    Angioedema Neg Hx      Current Outpatient Medications:    albuterol (VENTOLIN HFA) 108 (90 Base) MCG/ACT inhaler, Inhale 2 puffs into the lungs every 4  (four) hours as needed for wheezing or shortness of breath., Disp: 18 g, Rfl: 1   diclofenac Sodium (VOLTAREN) 1 % GEL, Apply to left knee 4 times daily as need., Disp: 4 g, Rfl: 1   ergocalciferol (VITAMIN D2) 1.25 MG (50000 UT) capsule, Take 50,000 Units by mouth once a week., Disp: , Rfl:    omeprazole (PRILOSEC) 40 MG capsule, Take 1 capsule (40 mg total) by mouth 2 (two) times daily., Disp: 60 capsule, Rfl: 5   pravastatin (PRAVACHOL) 20 MG tablet, TAKE 1 TABLET(20 MG) BY MOUTH DAILY, Disp: 90 tablet, Rfl: 1   SYMBICORT 160-4.5 MCG/ACT inhaler, Inhale 2 puffs into the lungs 2 (two) times daily., Disp: 10.2 g, Rfl: 5   EPINEPHrine 0.3 mg/0.3 mL IJ SOAJ injection, Inject 0.3 mg into the muscle as needed for anaphylaxis. Use as directed for severe allergic reaction (Patient not taking: Reported on 06/24/2021), Disp: 2 each, Rfl: 2  Current Facility-Administered Medications:    triamcinolone acetonide (KENALOG-40) injection 40 mg, 40 mg, Intramuscular, Once, Ghumman, Ramandeep, NP   Allergies  Allergen Reactions   Ciprofloxacin Other (See Comments)   Contrast Media [Iodinated Contrast Media] Other (See Comments)    Patient breaks out in hives pt has had ct scans in the past with premeds and has not had reaction.   Shellfish Allergy Other (See Comments)   Other Nausea And Vomiting, Rash and Other (See Comments)    Seafood allergy      The  patient states she uses post menopausal status for birth control. Last LMP was No LMP recorded. Patient has had a hysterectomy.. Negative for Dysmenorrhea. Negative for: breast discharge, breast lump(s), breast pain and breast self exam. Associated symptoms include abnormal vaginal bleeding. Pertinent negatives include abnormal bleeding (hematology), anxiety, decreased libido, depression, difficulty falling sleep, dyspareunia, history of infertility, nocturia, sexual dysfunction, sleep disturbances, urinary incontinence, urinary urgency, vaginal discharge and  vaginal itching. Diet regular.The patient states her exercise level is  minimal.  . The patient's tobacco use is:  Social History   Tobacco Use  Smoking Status Never  Smokeless Tobacco Never  . She has been exposed to passive smoke. The patient's alcohol use is:  Social History   Substance and Sexual Activity  Alcohol Use No   Alcohol/week: 0.0 standard drinks   Review of Systems  Constitutional: Negative.   HENT: Negative.    Eyes: Negative.   Respiratory: Negative.    Cardiovascular: Negative.   Gastrointestinal: Negative.   Endocrine: Negative.   Genitourinary: Negative.   Musculoskeletal: Negative.   Skin: Negative.   Allergic/Immunologic: Negative.   Neurological:  Positive for numbness.       She c/o tingling in her toes. Denies LE weakness. No h/o dm. Not sure what is triggering her sx.   Hematological: Negative.   Psychiatric/Behavioral: Negative.      Today's Vitals   06/24/21 0839  BP: 118/72  Temp: 98.2 F (36.8 C)  Weight: 117 lb 3.2 oz (53.2 kg)  Height: 5' 0.2" (1.529 m)  PainSc: 0-No pain   Body mass index is 22.74 kg/m.   Objective:  Physical Exam Vitals and nursing note reviewed.  Constitutional:      Appearance: Normal appearance.  HENT:     Head: Normocephalic and atraumatic.     Right Ear: Tympanic membrane, ear canal and external ear normal.     Left Ear: Tympanic membrane, ear canal and external ear normal.     Nose:     Comments: Masked     Mouth/Throat:     Comments: Masked  Eyes:     Extraocular Movements: Extraocular movements intact.     Conjunctiva/sclera: Conjunctivae normal.     Pupils: Pupils are equal, round, and reactive to light.  Cardiovascular:     Rate and Rhythm: Normal rate and regular rhythm.     Pulses: Normal pulses.          Dorsalis pedis pulses are 2+ on the right side and 2+ on the left side.     Heart sounds: Normal heart sounds.  Pulmonary:     Effort: Pulmonary effort is normal.     Breath sounds:  Normal breath sounds.  Chest:  Breasts:    Tanner Score is 5.     Right: Normal.     Left: Normal.  Abdominal:     General: Abdomen is flat. Bowel sounds are normal.     Palpations: Abdomen is soft.  Genitourinary:    Comments: deferred Musculoskeletal:        General: Normal range of motion.     Cervical back: Normal range of motion and neck supple.  Skin:    General: Skin is warm and dry.  Neurological:     General: No focal deficit present.     Mental Status: She is alert and oriented to person, place, and time.  Psychiatric:        Mood and Affect: Mood normal.        Behavior:  Behavior normal.        Assessment And Plan:     1. Encounter for health maintenance examination in adult Comments: A full exam was performed. Importance of monthly self breast exams was discussed with the patient. PATIENT IS ADVISED TO GET 30-45 MINUTES REGULAR EXERCISE NO LESS THAN FOUR TO FIVE DAYS PER WEEK - BOTH WEIGHTBEARING EXERCISES AND AEROBIC ARE RECOMMENDED.  PATIENT IS ADVISED TO FOLLOW A HEALTHY DIET WITH AT LEAST SIX FRUITS/VEGGIES PER DAY, DECREASE INTAKE OF RED MEAT, AND TO INCREASE FISH INTAKE TO TWO DAYS PER WEEK.  MEATS/FISH SHOULD NOT BE FRIED, BAKED OR BROILED IS PREFERABLE.  IT IS ALSO IMPORTANT TO CUT BACK ON YOUR SUGAR INTAKE. PLEASE AVOID ANYTHING WITH ADDED SUGAR, CORN SYRUP OR OTHER SWEETENERS. IF YOU MUST USE A SWEETENER, YOU CAN TRY STEVIA. IT IS ALSO IMPORTANT TO AVOID ARTIFICIALLY SWEETENERS AND DIET BEVERAGES. LASTLY, I SUGGEST WEARING SPF 50 SUNSCREEN ON EXPOSED PARTS AND ESPECIALLY WHEN IN THE DIRECT SUNLIGHT FOR AN EXTENDED PERIOD OF TIME.  PLEASE AVOID FAST FOOD RESTAURANTS AND INCREASE YOUR WATER INTAKE.   2. Pure hypercholesterolemia Comments: Chronic, currently on statin therapy. She is encouraged to follow heart healthy diet. She is encouraged to avoid fried foods, increase fish and fiber intake.   3. Epigastric pain Comments: She has benign exam. I suspect her sx  are related to GERD, encouraged to c/w PPI therapy and take meds as prescribed. I will also check labs as below.  - Amylase - Lipase  4. Paresthesia of foot, bilateral Comments: I will check vitamin B12 levels and TSH.  - TSH  5. Need for vaccination Comments: She was given pneumovax-23 IM  x1. - Pneumococcal polysaccharide vaccine 23-valent greater than or equal to 2yo subcutaneous/IM  6. Drug therapy - Vitamin B12 - CMP14+EGFR  Patient was given opportunity to ask questions. Patient verbalized understanding of the plan and was able to repeat key elements of the plan. All questions were answered to their satisfaction.   I, Maximino Greenland, MD, have reviewed all documentation for this visit. The documentation on 06/24/21 for the exam, diagnosis, procedures, and orders are all accurate and complete.   THE PATIENT IS ENCOURAGED TO PRACTICE SOCIAL DISTANCING DUE TO THE COVID-19 PANDEMIC.

## 2021-06-25 LAB — CMP14+EGFR
ALT: 10 IU/L (ref 0–32)
AST: 15 IU/L (ref 0–40)
Albumin/Globulin Ratio: 1.2 (ref 1.2–2.2)
Albumin: 4.2 g/dL (ref 3.7–4.7)
Alkaline Phosphatase: 94 IU/L (ref 44–121)
BUN/Creatinine Ratio: 25 (ref 12–28)
BUN: 14 mg/dL (ref 8–27)
Bilirubin Total: 0.3 mg/dL (ref 0.0–1.2)
CO2: 30 mmol/L — ABNORMAL HIGH (ref 20–29)
Calcium: 9.4 mg/dL (ref 8.7–10.3)
Chloride: 99 mmol/L (ref 96–106)
Creatinine, Ser: 0.56 mg/dL — ABNORMAL LOW (ref 0.57–1.00)
Globulin, Total: 3.6 g/dL (ref 1.5–4.5)
Glucose: 91 mg/dL (ref 70–99)
Potassium: 4.2 mmol/L (ref 3.5–5.2)
Sodium: 139 mmol/L (ref 134–144)
Total Protein: 7.8 g/dL (ref 6.0–8.5)
eGFR: 93 mL/min/{1.73_m2} (ref >59)

## 2021-06-25 LAB — LIPASE: Lipase: 14 U/L (ref 14–85)

## 2021-06-25 LAB — VITAMIN B12: Vitamin B-12: 453 pg/mL (ref 232–1245)

## 2021-06-25 LAB — AMYLASE: Amylase: 79 U/L (ref 31–110)

## 2021-06-25 LAB — TSH: TSH: 1.14 u[IU]/mL (ref 0.450–4.500)

## 2021-07-14 ENCOUNTER — Telehealth: Payer: Medicare Other

## 2021-08-11 NOTE — Congregational Nurse Program (Signed)
CN office visit with interpreter Diu Hartshorn assisting.  Helped patient reapply for Medicaid after receiving a denial letter saying she had not submitted all necessary documents.  Phone call to DSS who stated she would have to reapply. Jake Michaelis RN, Congregational Nurse [phone]

## 2021-08-31 ENCOUNTER — Ambulatory Visit: Payer: Medicare Other | Admitting: Internal Medicine

## 2021-09-01 ENCOUNTER — Ambulatory Visit: Payer: Medicare Other | Admitting: Internal Medicine

## 2021-09-03 ENCOUNTER — Telehealth: Payer: Medicare Other

## 2021-09-03 ENCOUNTER — Telehealth: Payer: Self-pay

## 2021-09-03 NOTE — Telephone Encounter (Signed)
?  Care Management  ? ?Follow Up Note ? ? ?09/03/2021 ?Name: Samantha [name] MRN: [id number] DOB: 1940/08/21 ? ? ?Referred by: Glendale Chard, MD ?Reason for referral : Chronic Care Management (RN CM Follow up call - #2 ) ? ? ?A second unsuccessful telephone outreach was attempted today. The patient was referred to the case management team for assistance with care management and care coordination.  ? ?Follow Up Plan: Telephone follow up appointment with care management team member scheduled for: 09/17/21 ? ?Barb Merino, RN, BSN, CCM ?Care Management Coordinator ?[place] [facility]  ?Direct Phone: [phone] ? ? ?

## 2021-09-07 DIAGNOSIS — K219 Gastro-esophageal reflux disease without esophagitis: Secondary | ICD-10-CM | POA: Diagnosis not present

## 2021-09-07 DIAGNOSIS — R070 Pain in throat: Secondary | ICD-10-CM | POA: Diagnosis not present

## 2021-09-17 ENCOUNTER — Telehealth: Payer: Self-pay

## 2021-09-17 ENCOUNTER — Telehealth: Payer: Medicare Other

## 2021-09-17 NOTE — Telephone Encounter (Signed)
?  Care Management  ? ?Follow Up Note ? ? ?09/17/2021 ?Name: Samantha [name] MRN: [id number] DOB: [birth year] ? ? ?Referred by: Dorothyann Peng, MD ?Reason for referral : Chronic Care Management (RN CM follow up call #2) ? ? ?A second unsuccessful telephone outreach was attempted today. The patient was referred to the case management team for assistance with care management and care coordination.  ? ?Follow Up Plan: Telephone follow up appointment with care management team member scheduled for: 10/05/21 ? ?Delsa Sale, RN, BSN, CCM ?Care Management Coordinator ?[facility]  ?Direct Phone: [phone] ? ? ?

## 2021-10-05 ENCOUNTER — Ambulatory Visit: Payer: Self-pay

## 2021-10-05 ENCOUNTER — Telehealth: Payer: Medicare Other

## 2021-10-05 DIAGNOSIS — M1712 Unilateral primary osteoarthritis, left knee: Secondary | ICD-10-CM

## 2021-10-05 DIAGNOSIS — J4550 Severe persistent asthma, uncomplicated: Secondary | ICD-10-CM

## 2021-10-05 DIAGNOSIS — E7800 Pure hypercholesterolemia, unspecified: Secondary | ICD-10-CM

## 2021-10-05 DIAGNOSIS — E559 Vitamin D deficiency, unspecified: Secondary | ICD-10-CM

## 2021-10-05 NOTE — Chronic Care Management (AMB) (Signed)
?  Care Management  ? ?Follow Up Note ? ? ?10/05/2021 ?Name: Samantha [name] MRN: [id number] DOB: Oct 11, 1940 ? ? ?Referred by: Glendale Chard, MD ?Reason for referral : Chronic Care Management (RN CM Follow up call #3) ? ? ?Third unsuccessful telephone outreach was attempted today. The patient was referred to the case management team for assistance with care management and care coordination. The patient's primary care provider has been notified of our unsuccessful attempts to make or maintain contact with the patient. The care management team is pleased to engage with this patient at any time in the future should he/she be interested in assistance from the care management team.  ? ?Follow Up Plan: We have been unable to make contact with the patient for follow up. The care management team is available to follow up with the patient after provider conversation with the patient regarding recommendation for care management engagement and subsequent re-referral to the care management team.  ? ?Barb Merino, RN, BSN, CCM ?Care Management Coordinator ?[place] [facility]  ?Direct Phone: [phone] ? ? ?

## 2021-11-30 ENCOUNTER — Other Ambulatory Visit: Payer: Self-pay | Admitting: Internal Medicine

## 2021-12-28 ENCOUNTER — Ambulatory Visit: Payer: Medicare Other | Admitting: Allergy and Immunology

## 2021-12-28 VITALS — BP 118/62 | HR 54 | Temp 98.3°F | Resp 16 | Ht 59.0 in | Wt 113.8 lb

## 2021-12-28 DIAGNOSIS — J3089 Other allergic rhinitis: Secondary | ICD-10-CM

## 2021-12-28 DIAGNOSIS — K219 Gastro-esophageal reflux disease without esophagitis: Secondary | ICD-10-CM

## 2021-12-28 DIAGNOSIS — Z91013 Allergy to seafood: Secondary | ICD-10-CM | POA: Diagnosis not present

## 2021-12-28 DIAGNOSIS — J4550 Severe persistent asthma, uncomplicated: Secondary | ICD-10-CM

## 2021-12-28 MED ORDER — SYMBICORT 160-4.5 MCG/ACT IN AERO: 2.0000 | INHALATION_SPRAY | Freq: Two times a day (BID) | RESPIRATORY_TRACT | 5 refills | Status: DC

## 2021-12-28 MED ORDER — ALBUTEROL SULFATE HFA 108 (90 BASE) MCG/ACT IN AERS: 2.0000 | INHALATION_SPRAY | RESPIRATORY_TRACT | 1 refills | Status: DC | PRN

## 2021-12-28 MED ORDER — EPINEPHRINE 0.3 MG/0.3ML IJ SOAJ: 0.3000 mg | INTRAMUSCULAR | 1 refills | Status: DC | PRN

## 2021-12-28 NOTE — Progress Notes (Signed)
[place] - [place] - [place] - [place] - [place]   Follow-up Note  Referring Provider: Dorothyann Peng, MD Primary Provider: Dorothyann Peng, MD Date of Office Visit: 12/28/2021  Subjective:   Samantha [name] (DOB: 1941-04-17) is a 81 y.o. female who [facility] in re-evaluation of the following:  HPI: Samantha [name] returns to this clinic in evaluation of asthma, allergic rhinitis, LPR, and seafood allergy.  She was last seen in this clinic on 01 June 2021.  She has done very well with her airway without the need for systemic steroid or antibiotic to treat an airway issue and rare use of the short acting bronchodilator while she continues on Symbicort on a daily basis.  Currently she is using Symbicort twice a day.  She has had very little problems with her throat.  She did visit with a GI doctor regarding her reflux and apparently she was taken off her omeprazole over the course of the past month if not a little bit longer and she has a repeat appointment to see the GI doctor on 03 January 2022.  She has about the same amount of regurgitation and burning whether she uses omeprazole or not.  She remains away from consumption of shellfish.  Allergies as of 12/28/2021       Reactions   Ciprofloxacin Other (See Comments)   Contrast Media [iodinated Contrast Media] Other (See Comments)   Patient breaks out in hives pt has had ct scans in the past with premeds and has not had reaction.   Shellfish Allergy Other (See Comments)   Other Nausea And Vomiting, Rash, Other (See Comments)   Seafood allergy        Medication List    albuterol 108 (90 Base) MCG/ACT inhaler Commonly known as: VENTOLIN HFA Inhale 2 puffs into the lungs every 4 (four) hours as needed for wheezing or shortness of breath.   EPINEPHrine 0.3 mg/0.3 mL Soaj injection Commonly known as: EPI-PEN Inject 0.3 mg into the muscle as needed for anaphylaxis. Use as directed for severe  allergic reaction   pravastatin 20 MG tablet Commonly known as: PRAVACHOL TAKE 1 TABLET(20 MG) BY MOUTH DAILY   Symbicort 160-4.5 MCG/ACT inhaler Generic drug: budesonide-formoterol Inhale 2 puffs into the lungs 2 (two) times daily.   Vitamin D (Ergocalciferol) 1.25 MG (50000 UNIT) Caps capsule Commonly known as: DRISDOL TAKE 1 CAPSULE BY MOUTH TWICE WEEKLY ON TUESDAYS/FRIDAYS    Past Medical History:  Diagnosis Date   Asthma    GERD (gastroesophageal reflux disease)    H. pylori infection    Hypercholesteremia    UTI (lower urinary tract infection)     Past Surgical History:  Procedure Laterality Date   ABDOMINAL HYSTERECTOMY     ABDOMINAL SURGERY     CESAREAN SECTION     CHOLECYSTECTOMY N/A 05/21/2018   Procedure: LAPAROSCOPIC CHOLECYSTECTOMY WITH ATTEMPTED INTRAOPERATIVE CHOLANGIOGRAM;  Surgeon: Luretha Murphy, MD;  Location: [facility];  Service: General;  Laterality: N/A;    Review of systems negative except as noted in HPI / PMHx or noted below:  Review of Systems  Constitutional: Negative.   HENT: Negative.    Eyes: Negative.   Respiratory: Negative.    Cardiovascular: Negative.   Gastrointestinal: Negative.   Genitourinary: Negative.   Musculoskeletal: Negative.   Skin: Negative.   Neurological: Negative.   Endo/Heme/Allergies: Negative.   Psychiatric/Behavioral: Negative.       Objective:   There were no vitals filed for  this visit.        Physical Exam Constitutional:      Appearance: She is not diaphoretic.  HENT:     Head: Normocephalic.     Right Ear: Tympanic membrane, ear canal and external ear normal.     Left Ear: Tympanic membrane, ear canal and external ear normal.     Nose: Nose normal. No mucosal edema or rhinorrhea.     Mouth/Throat:     Pharynx: Uvula midline. No oropharyngeal exudate.  Eyes:     Conjunctiva/sclera: Conjunctivae normal.  Neck:     Thyroid: No thyromegaly.     Trachea: Trachea normal. No tracheal tenderness or  tracheal deviation.  Cardiovascular:     Rate and Rhythm: Normal rate and regular rhythm.     Heart sounds: Normal heart sounds, S1 normal and S2 normal. No murmur heard. Pulmonary:     Effort: No respiratory distress.     Breath sounds: Normal breath sounds. No stridor. No wheezing or rales.  Lymphadenopathy:     Head:     Right side of head: No tonsillar adenopathy.     Left side of head: No tonsillar adenopathy.     Cervical: No cervical adenopathy.  Skin:    Findings: No erythema or rash.     Nails: There is no clubbing.  Neurological:     Mental Status: She is alert.     Diagnostics:    Spirometry was performed and demonstrated an FEV1 of 1.06 at 67 % of predicted.  Assessment and Plan:   1. Asthma, severe persistent, well-controlled   2. Other allergic rhinitis   3. LPRD (laryngopharyngeal reflux disease)   4. Seafood allergy     1.  Continue to perform Allergen avoidance measures - No seafood  2.  Continue to Treat and prevent inflammation:   A.  Symbicort 160 - 2 inhalations twice a day with spacer  3.  Continue to Treat and prevent reflux as directed by GI doctor   4.  If needed:   A.  Proventil HFA or similar 2 puffs every 4-6 hours  B.  Nasal saline  C.  Epi-Pen  5. Return to clinic in 6 months or earlier if problem  6. Obtain fall flu vaccine and RSV vaccine  Samantha [name] appears to be doing very well while using Symbicort as her major controller agent and she will continue on this anti-inflammatory agent for her airway and she has several medications that she can use should they be required as noted above to address her atopic disease.  Obviously she will remain away from seafood consumption.  She will have her GI doctor direct her treatment of reflux as she has a repeat appointment to see him on 03 January 2022.  I will see her back in this clinic in 6 months or earlier if there is a problem.  Samantha Schimke, MD Allergy / Immunology [facility]

## 2021-12-29 ENCOUNTER — Encounter: Payer: Self-pay | Admitting: Allergy and Immunology

## 2022-01-03 DIAGNOSIS — R070 Pain in throat: Secondary | ICD-10-CM | POA: Diagnosis not present

## 2022-01-03 DIAGNOSIS — K219 Gastro-esophageal reflux disease without esophagitis: Secondary | ICD-10-CM | POA: Diagnosis not present

## 2022-01-25 ENCOUNTER — Encounter: Payer: Self-pay | Admitting: Internal Medicine

## 2022-01-25 ENCOUNTER — Ambulatory Visit (INDEPENDENT_AMBULATORY_CARE_PROVIDER_SITE_OTHER): Payer: Medicare Other | Admitting: Internal Medicine

## 2022-01-25 VITALS — BP 128/68 | HR 67 | Temp 97.8°F | Ht 59.0 in | Wt 112.2 lb

## 2022-01-25 DIAGNOSIS — E7800 Pure hypercholesterolemia, unspecified: Secondary | ICD-10-CM | POA: Diagnosis not present

## 2022-01-25 DIAGNOSIS — R7309 Other abnormal glucose: Secondary | ICD-10-CM | POA: Diagnosis not present

## 2022-01-25 DIAGNOSIS — M25541 Pain in joints of right hand: Secondary | ICD-10-CM

## 2022-01-25 DIAGNOSIS — Z23 Encounter for immunization: Secondary | ICD-10-CM | POA: Diagnosis not present

## 2022-01-25 DIAGNOSIS — R3915 Urgency of urination: Secondary | ICD-10-CM

## 2022-01-25 DIAGNOSIS — M25542 Pain in joints of left hand: Secondary | ICD-10-CM | POA: Diagnosis not present

## 2022-01-25 DIAGNOSIS — L309 Dermatitis, unspecified: Secondary | ICD-10-CM | POA: Diagnosis not present

## 2022-01-25 LAB — POCT URINALYSIS DIPSTICK
Bilirubin, UA: NEGATIVE
Blood, UA: NEGATIVE
Glucose, UA: NEGATIVE
Ketones, UA: NEGATIVE
Leukocytes, UA: NEGATIVE
Nitrite, UA: NEGATIVE
Protein, UA: NEGATIVE
Spec Grav, UA: 1.02 (ref 1.010–1.025)
Urobilinogen, UA: 0.2 E.U./dL
pH, UA: 8.5 — AB (ref 5.0–8.0)

## 2022-01-25 NOTE — Patient Instructions (Signed)
Cholesterol Content in Foods ?Cholesterol is a waxy, fat-like substance that helps to carry fat in the blood. The body needs cholesterol in small amounts, but too much cholesterol can cause damage to the arteries and heart. ?What foods have cholesterol? ? ?Cholesterol is found in animal-based foods, such as meat, seafood, and dairy. Generally, low-fat dairy and lean meats have less cholesterol than full-fat dairy and fatty meats. The milligrams of cholesterol per serving (mg per serving) of common cholesterol-containing foods are listed below. ?Meats and other proteins ?Egg -- one large whole egg has 186 mg. ?Veal shank -- 4 oz (113 g) has 141 mg. ?Lean ground turkey (93% lean) -- 4 oz (113 g) has 118 mg. ?Fat-trimmed lamb loin -- 4 oz (113 g) has 106 mg. ?Lean ground beef (90% lean) -- 4 oz (113 g) has 100 mg. ?Lobster -- 3.5 oz (99 g) has 90 mg. ?Pork loin chops -- 4 oz (113 g) has 86 mg. ?Canned salmon -- 3.5 oz (99 g) has 83 mg. ?Fat-trimmed beef top loin -- 4 oz (113 g) has 78 mg. ?Frankfurter -- 1 frank (3.5 oz or 99 g) has 77 mg. ?Crab -- 3.5 oz (99 g) has 71 mg. ?Roasted chicken without skin, white meat -- 4 oz (113 g) has 66 mg. ?Light bologna -- 2 oz (57 g) has 45 mg. ?Deli-cut turkey -- 2 oz (57 g) has 31 mg. ?Canned tuna -- 3.5 oz (99 g) has 31 mg. ?Bacon -- 1 oz (28 g) has 29 mg. ?Oysters and mussels (raw) -- 3.5 oz (99 g) has 25 mg. ?Mackerel -- 1 oz (28 g) has 22 mg. ?Trout -- 1 oz (28 g) has 20 mg. ?Pork sausage -- 1 link (1 oz or 28 g) has 17 mg. ?Salmon -- 1 oz (28 g) has 16 mg. ?Tilapia -- 1 oz (28 g) has 14 mg. ?Dairy ?Soft-serve ice cream -- ? cup (4 oz or 86 g) has 103 mg. ?Whole-milk yogurt -- 1 cup (8 oz or 245 g) has 29 mg. ?Cheddar cheese -- 1 oz (28 g) has 28 mg. ?American cheese -- 1 oz (28 g) has 28 mg. ?Whole milk -- 1 cup (8 oz or 250 mL) has 23 mg. ?2% milk -- 1 cup (8 oz or 250 mL) has 18 mg. ?Cream cheese -- 1 tablespoon (Tbsp) (14.5 g) has 15 mg. ?Cottage cheese -- ? cup (4 oz or  113 g) has 14 mg. ?Low-fat (1%) milk -- 1 cup (8 oz or 250 mL) has 10 mg. ?Sour cream -- 1 Tbsp (12 g) has 8.5 mg. ?Low-fat yogurt -- 1 cup (8 oz or 245 g) has 8 mg. ?Nonfat Greek yogurt -- 1 cup (8 oz or 228 g) has 7 mg. ?Half-and-half cream -- 1 Tbsp (15 mL) has 5 mg. ?Fats and oils ?Cod liver oil -- 1 tablespoon (Tbsp) (13.6 g) has 82 mg. ?Butter -- 1 Tbsp (14 g) has 15 mg. ?Lard -- 1 Tbsp (12.8 g) has 14 mg. ?Bacon grease -- 1 Tbsp (12.9 g) has 14 mg. ?Mayonnaise -- 1 Tbsp (13.8 g) has 5-10 mg. ?Margarine -- 1 Tbsp (14 g) has 3-10 mg. ?The items listed above may not be a complete list of foods with cholesterol. Exact amounts of cholesterol in these foods may vary depending on specific ingredients and brands. Contact a dietitian for more information. ?What foods do not have cholesterol? ?Most plant-based foods do not have cholesterol unless you combine them with a food that has   cholesterol. Foods without cholesterol include: ?Grains and cereals. ?Vegetables. ?Fruits. ?Vegetable oils, such as olive, canola, and sunflower oil. ?Legumes, such as peas, beans, and lentils. ?Nuts and seeds. ?Egg whites. ?The items listed above may not be a complete list of foods that do not have cholesterol. Contact a dietitian for more information. ?Summary ?The body needs cholesterol in small amounts, but too much cholesterol can cause damage to the arteries and heart. ?Cholesterol is found in animal-based foods, such as meat, seafood, and dairy. Generally, low-fat dairy and lean meats have less cholesterol than full-fat dairy and fatty meats. ?This information is not intended to replace advice given to you by your health care provider. Make sure you discuss any questions you have with your health care provider. ?Document Revised: 10/30/2020 Document Reviewed: 10/30/2020 ?Elsevier Patient Education ? 2023 Elsevier Inc. ? ?

## 2022-01-25 NOTE — Progress Notes (Signed)
Barnet Glasgow Martin,acting as a Education administrator for Maximino Greenland, MD.,have documented all relevant documentation on the behalf of Maximino Greenland, MD,as directed by  Maximino Greenland, MD while in the presence of Maximino Greenland, MD.    Subjective:     Patient ID: Samantha [name] , female    DOB: [birth year] , 81 y.o.   MRN: [id number]   Chief Complaint  Patient presents with  . Rash   She presents today for further evaluation of rash on both hands. Unfortunately, interpreter is not available today. She states her skin gets very dry and peels on her hands.   Rash This is a recurrent problem. The current episode started more than 1 month ago. The affected locations include the left hand and right hand. The rash is characterized by dryness. She was exposed to nothing. Pertinent negatives include no congestion, cough, fatigue, fever or shortness of breath.    HPI Patient states she is having peeling of skin and left is dry.  BP Readings from Last 3 Encounters:  01/25/22 128/68  12/29/21 118/62  06/24/21 118/72       Past Medical History:  Diagnosis Date  . Asthma   . GERD (gastroesophageal reflux disease)   . H. pylori infection   . Hypercholesteremia   . UTI (lower urinary tract infection)      Family History  Problem Relation Age of Onset  . Cancer Mother   . Asthma Father   . Allergic rhinitis Father   . Food Allergy Father   . Urticaria Neg Hx   . Eczema Neg Hx   . Angioedema Neg Hx      Current Outpatient Medications:  .  albuterol (VENTOLIN HFA) 108 (90 Base) MCG/ACT inhaler, Inhale 2 puffs into the lungs every 4 (four) hours as needed for wheezing or shortness of breath., Disp: 18 g, Rfl: 1 .  EPINEPHrine 0.3 mg/0.3 mL IJ SOAJ injection, Inject 0.3 mg into the muscle as needed for anaphylaxis. Use as directed for severe allergic reaction, Disp: 2 each, Rfl: 1 .  pravastatin (PRAVACHOL) 20 MG tablet, TAKE 1 TABLET(20 MG) BY MOUTH DAILY, Disp: 90 tablet, Rfl: 1 .  SYMBICORT  160-4.5 MCG/ACT inhaler, Inhale 2 puffs into the lungs 2 (two) times daily., Disp: 10.2 g, Rfl: 5  Current Facility-Administered Medications:  .  triamcinolone acetonide (KENALOG-40) injection 40 mg, 40 mg, Intramuscular, Once, Ghumman, Ramandeep, NP   Allergies  Allergen Reactions  . Ciprofloxacin Other (See Comments)  . Contrast Media [Iodinated Contrast Media] Other (See Comments)    Patient breaks out in hives pt has had ct scans in the past with premeds and has not had reaction.  . Shellfish Allergy Other (See Comments)  . Other Nausea And Vomiting, Rash and Other (See Comments)    Seafood allergy     Review of Systems  Constitutional: Negative.  Negative for fatigue and fever.  HENT: Negative.  Negative for congestion.   Eyes: Negative.   Respiratory: Negative.  Negative for cough and shortness of breath.   Cardiovascular: Negative.   Gastrointestinal: Negative.   Musculoskeletal:  Positive for arthralgias.       She c/o b/l hand pain. Hands are stiff upon awakening. Denies fall/trauma. She has not taken anything for pain  Skin:  Positive for rash.  Hematological: Negative.   Psychiatric/Behavioral: Negative.       Today's Vitals   01/25/22 0845  BP: 128/68  Pulse: 67  Temp: 97.8 F (36.6 C)  TempSrc: Oral  Weight: 112 lb 3.2 oz (50.9 kg)  Height: _0  (1.499 m)  PainSc: 0-No pain   Body mass index is 22.66 kg/m.   Objective:  Physical Exam Vitals and nursing note reviewed.  Constitutional:      Appearance: Normal appearance.  HENT:     Head: Normocephalic and atraumatic.  Cardiovascular:     Rate and Rhythm: Normal rate and regular rhythm.     Heart sounds: Normal heart sounds.  Pulmonary:     Effort: Pulmonary effort is normal.     Breath sounds: Normal breath sounds.  Skin:    General: Skin is warm.     Comments: Scales not present. Right fingertips are dry and cracked.   Neurological:     General: No focal deficit present.     Mental Status:  She is alert.  Psychiatric:        Mood and Affect: Mood normal.        Behavior: Behavior normal.     Assessment And Plan:     1. Dermatitis Comments: She is advised to use moisturizer on hands after washing. I will refer her to Derm as requested.  - CMP14+EGFR - Ambulatory referral to Dermatology  2. Pure hypercholesterolemia Comments: Chronic, currently on pravastatin daily.  - Lipid panel - CMP14+EGFR - Hemoglobin A1c  3. Arthralgia of both hands Comments: Squeeze test pos b/l. I will check an arthritis panel. She is advised to follow an anti-inflammatory diet.  - ANA, IFA (with reflex) - CYCLIC CITRUL PEPTIDE ANTIBODY, IGG/IGA - Rheumatoid factor - Sedimentation rate - Uric acid - CBC no Diff  4. Urinary urgency Comments: I will check urinalysis to r/o UTI.  - POCT Urinalysis Dipstick (81002)  5. Need for vaccination Comments: She was given Shingrix IM x 1. This will be billed via [facility].  - Zoster Recombinant (Shingrix )   Patient was given opportunity to ask questions. Patient verbalized understanding of the plan and was able to repeat key elements of the plan. All questions were answered to their satisfaction.   I, Maximino Greenland, MD, have reviewed all documentation for this visit. The documentation on 01/25/22 for the exam, diagnosis, procedures, and orders are all accurate and complete.   IF YOU HAVE BEEN REFERRED TO A SPECIALIST, IT MAY TAKE 1-2 WEEKS TO SCHEDULE/PROCESS THE REFERRAL. IF YOU HAVE NOT HEARD FROM US/SPECIALIST IN TWO WEEKS, PLEASE GIVE [place] A CALL AT [phone].   THE PATIENT IS ENCOURAGED TO PRACTICE SOCIAL DISTANCING DUE TO THE COVID-19 PANDEMIC.

## 2022-01-28 LAB — LIPID PANEL
Chol/HDL Ratio: 2.7 ratio (ref 0.0–4.4)
Cholesterol, Total: 157 mg/dL (ref 100–199)
HDL: 59 mg/dL (ref >39)
LDL Chol Calc (NIH): 77 mg/dL (ref 0–99)
Triglycerides: 120 mg/dL (ref 0–149)
VLDL Cholesterol Cal: 21 mg/dL (ref 5–40)

## 2022-01-28 LAB — CBC
Hematocrit: 36.6 % (ref 34.0–46.6)
Hemoglobin: 11.1 g/dL (ref 11.1–15.9)
MCH: 26.1 pg — ABNORMAL LOW (ref 26.6–33.0)
MCHC: 30.3 g/dL — ABNORMAL LOW (ref 31.5–35.7)
MCV: 86 fL (ref 79–97)
Platelets: 241 10*3/uL (ref 150–450)
RBC: 4.26 x10E6/uL (ref 3.77–5.28)
RDW: 14 % (ref 11.7–15.4)
WBC: 6.7 10*3/uL (ref 3.4–10.8)

## 2022-01-28 LAB — CMP14+EGFR
ALT: 15 IU/L (ref 0–32)
AST: 22 IU/L (ref 0–40)
Albumin/Globulin Ratio: 1.3 (ref 1.2–2.2)
Albumin: 4.2 g/dL (ref 3.8–4.8)
Alkaline Phosphatase: 67 IU/L (ref 44–121)
BUN/Creatinine Ratio: 18 (ref 12–28)
BUN: 10 mg/dL (ref 8–27)
Bilirubin Total: 0.3 mg/dL (ref 0.0–1.2)
CO2: 29 mmol/L (ref 20–29)
Calcium: 9.2 mg/dL (ref 8.7–10.3)
Chloride: 102 mmol/L (ref 96–106)
Creatinine, Ser: 0.56 mg/dL — ABNORMAL LOW (ref 0.57–1.00)
Globulin, Total: 3.3 g/dL (ref 1.5–4.5)
Glucose: 89 mg/dL (ref 70–99)
Potassium: 4.4 mmol/L (ref 3.5–5.2)
Sodium: 142 mmol/L (ref 134–144)
Total Protein: 7.5 g/dL (ref 6.0–8.5)
eGFR: 92 mL/min/{1.73_m2} (ref >59)

## 2022-01-28 LAB — HEMOGLOBIN A1C
Est. average glucose Bld gHb Est-mCnc: 111 mg/dL
Hgb A1c MFr Bld: 5.5 % (ref 4.8–5.6)

## 2022-01-28 LAB — SEDIMENTATION RATE: Sed Rate: 22 mm/hr (ref 0–40)

## 2022-01-28 LAB — ANTINUCLEAR ANTIBODIES, IFA: ANA Titer 1: NEGATIVE

## 2022-01-28 LAB — CYCLIC CITRUL PEPTIDE ANTIBODY, IGG/IGA: Cyclic Citrullin Peptide Ab: 3 units (ref 0–19)

## 2022-01-28 LAB — URIC ACID: Uric Acid: 4.7 mg/dL (ref 3.1–7.9)

## 2022-01-28 LAB — RHEUMATOID FACTOR: Rheumatoid fact SerPl-aCnc: 11.8 IU/mL (ref <14.0)

## 2022-02-14 ENCOUNTER — Other Ambulatory Visit: Payer: Self-pay | Admitting: Internal Medicine

## 2022-02-14 DIAGNOSIS — E7800 Pure hypercholesterolemia, unspecified: Secondary | ICD-10-CM

## 2022-02-18 ENCOUNTER — Other Ambulatory Visit: Payer: Self-pay | Admitting: Internal Medicine

## 2022-02-18 DIAGNOSIS — E7800 Pure hypercholesterolemia, unspecified: Secondary | ICD-10-CM

## 2022-02-21 ENCOUNTER — Other Ambulatory Visit: Payer: Self-pay

## 2022-02-21 DIAGNOSIS — E7800 Pure hypercholesterolemia, unspecified: Secondary | ICD-10-CM

## 2022-02-21 MED ORDER — PRAVASTATIN SODIUM 20 MG PO TABS: ORAL_TABLET | ORAL | 1 refills | Status: DC

## 2022-03-03 DIAGNOSIS — M25562 Pain in left knee: Secondary | ICD-10-CM | POA: Diagnosis not present

## 2022-03-15 DIAGNOSIS — M1712 Unilateral primary osteoarthritis, left knee: Secondary | ICD-10-CM | POA: Diagnosis not present

## 2022-03-25 DIAGNOSIS — M25562 Pain in left knee: Secondary | ICD-10-CM | POA: Diagnosis not present

## 2022-04-21 ENCOUNTER — Ambulatory Visit (INDEPENDENT_AMBULATORY_CARE_PROVIDER_SITE_OTHER): Payer: Medicare Other

## 2022-04-21 ENCOUNTER — Ambulatory Visit: Payer: Medicare Other

## 2022-04-21 VITALS — BP 122/74 | HR 61 | Temp 98.1°F | Ht 59.0 in | Wt 112.0 lb

## 2022-04-21 DIAGNOSIS — Z23 Encounter for immunization: Secondary | ICD-10-CM

## 2022-04-21 NOTE — Progress Notes (Signed)
Patient presents today for HD flu shot.

## 2022-05-03 ENCOUNTER — Ambulatory Visit (INDEPENDENT_AMBULATORY_CARE_PROVIDER_SITE_OTHER): Payer: Medicare Other | Admitting: Internal Medicine

## 2022-05-03 ENCOUNTER — Encounter: Payer: Self-pay | Admitting: Internal Medicine

## 2022-05-03 VITALS — BP 112/72 | HR 51 | Temp 97.8°F | Ht 59.0 in | Wt 115.8 lb

## 2022-05-03 DIAGNOSIS — L299 Pruritus, unspecified: Secondary | ICD-10-CM

## 2022-05-03 DIAGNOSIS — K5909 Other constipation: Secondary | ICD-10-CM | POA: Diagnosis not present

## 2022-05-03 DIAGNOSIS — Z6823 Body mass index (BMI) 23.0-23.9, adult: Secondary | ICD-10-CM | POA: Diagnosis not present

## 2022-05-03 DIAGNOSIS — E7800 Pure hypercholesterolemia, unspecified: Secondary | ICD-10-CM

## 2022-05-03 DIAGNOSIS — Z23 Encounter for immunization: Secondary | ICD-10-CM

## 2022-05-03 NOTE — Progress Notes (Signed)
Rich Brave Llittleton,acting as a Education administrator for Maximino Greenland, MD.,have documented all relevant documentation on the behalf of Maximino Greenland, MD,as directed by  Maximino Greenland, MD while in the presence of Maximino Greenland, MD.    Subjective:     Patient ID: Samantha [name] , female    DOB: 1941-03-29 , 81 y.o.   MRN: [id number]   Chief Complaint  Patient presents with   Hyperlipidemia    HPI  Patient presents today for a 6 week chol check.  Interpreter is present.  Patient is tolerating the med well. She also reports having stomach pain. The discomfort is in her lower abdomen. Described as dull and crampy. There is associated constipation.    Hyperlipidemia This is a chronic problem. The problem is controlled. She has no history of diabetes. There are no known factors aggravating her hyperlipidemia. Pertinent negatives include no chest pain or shortness of breath. Current antihyperlipidemic treatment includes statins.     Past Medical History:  Diagnosis Date   Asthma    GERD (gastroesophageal reflux disease)    H. pylori infection    Hypercholesteremia    UTI (lower urinary tract infection)      Family History  Problem Relation Age of Onset   Cancer Mother    Asthma Father    Allergic rhinitis Father    Food Allergy Father    Urticaria Neg Hx    Eczema Neg Hx    Angioedema Neg Hx      Current Outpatient Medications:    pravastatin (PRAVACHOL) 20 MG tablet, TAKE 1 TABLET BY MOUTH DAILY., Disp: 90 tablet, Rfl: 1   SYMBICORT 160-4.5 MCG/ACT inhaler, Inhale 2 puffs into the lungs 2 (two) times daily., Disp: 10.2 g, Rfl: 5   Vitamin D, Ergocalciferol, (DRISDOL) 1.25 MG (50000 UNIT) CAPS capsule, Take 50,000 Units by mouth once a week., Disp: , Rfl:    albuterol (VENTOLIN HFA) 108 (90 Base) MCG/ACT inhaler, Inhale 2 puffs into the lungs every 4 (four) hours as needed for wheezing or shortness of breath. (Patient not taking: Reported on 05/03/2022), Disp: 18 g, Rfl: 1    EPINEPHrine 0.3 mg/0.3 mL IJ SOAJ injection, Inject 0.3 mg into the muscle as needed for anaphylaxis. Use as directed for severe allergic reaction (Patient not taking: Reported on 05/03/2022), Disp: 2 each, Rfl: 1  Current Facility-Administered Medications:    triamcinolone acetonide (KENALOG-40) injection 40 mg, 40 mg, Intramuscular, Once, Ghumman, Ramandeep, NP   Allergies  Allergen Reactions   Ciprofloxacin Other (See Comments)   Contrast Media [Iodinated Contrast Media] Other (See Comments)    Patient breaks out in hives pt has had ct scans in the past with premeds and has not had reaction.   Shellfish Allergy Other (See Comments)   Other Nausea And Vomiting, Rash and Other (See Comments)    Seafood allergy     Review of Systems  Constitutional: Negative.   Eyes: Negative.   Respiratory: Negative.  Negative for shortness of breath.   Cardiovascular: Negative.  Negative for chest pain.  Gastrointestinal:  Positive for abdominal pain and constipation.       She also reports having stomach pain. The discomfort is in her lower abdomen. Described as dull and crampy. There is associated constipation.  Denies having any n/v/d.   Musculoskeletal: Negative.   Skin: Negative.        She c/o itching in her hands. Wants to be checked for diabetes  Neurological: Negative.   Psychiatric/Behavioral:  Negative.       Today's Vitals   05/03/22 0926  BP: 112/72  Pulse: (!) 51  Temp: 97.8 F (36.6 C)  Weight: 115 lb 12.8 oz (52.5 kg)  Height: _0  (1.499 m)  PainSc: 0-No pain   Body mass index is 23.39 kg/m.  Wt Readings from Last 3 Encounters:  05/03/22 115 lb 12.8 oz (52.5 kg)  04/21/22 112 lb (50.8 kg)  01/25/22 112 lb 3.2 oz (50.9 kg)     Objective:  Physical Exam Vitals and nursing note reviewed.  Constitutional:      Appearance: Normal appearance.  HENT:     Head: Normocephalic and atraumatic.     Nose:     Comments: Masked     Mouth/Throat:     Comments: Masked   Eyes:     Extraocular Movements: Extraocular movements intact.  Cardiovascular:     Rate and Rhythm: Normal rate and regular rhythm.     Heart sounds: Normal heart sounds.  Pulmonary:     Effort: Pulmonary effort is normal.     Breath sounds: Normal breath sounds.  Abdominal:     General: Bowel sounds are normal.     Palpations: Abdomen is soft.     Tenderness: There is no abdominal tenderness. There is no guarding or rebound.  Musculoskeletal:     Cervical back: Normal range of motion.  Skin:    General: Skin is warm.  Neurological:     General: No focal deficit present.     Mental Status: She is alert.  Psychiatric:        Mood and Affect: Mood normal.        Behavior: Behavior normal.       Assessment And Plan:     1. Pure hypercholesterolemia Comments: Chronic, last LDL 77 in July 2023. She will c/w pravsatatin daily. No need to recheck lipid levels today.  2. Chronic constipation Comments: Chronic, advised to start fiber supplement and increase hydration. This is likely contributing to her abdominal discomfort.  3. Pruritus Comments: No rash noted on her hands. I will check labs as below. - CMP14+EGFR - CBC no Diff  4. BMI 23.0-23.9, adult Comments: She is encouraged to aim for at least 150 minutes per week.  5. Immunization due - Varicella-zoster vaccine IM    Patient was given opportunity to ask questions. Patient verbalized understanding of the plan and was able to repeat key elements of the plan. All questions were answered to their satisfaction.   I, Maximino Greenland, MD, have reviewed all documentation for this visit. The documentation on 05/03/22 for the exam, diagnosis, procedures, and orders are all accurate and complete.   IF YOU HAVE BEEN REFERRED TO A SPECIALIST, IT MAY TAKE 1-2 WEEKS TO SCHEDULE/PROCESS THE REFERRAL. IF YOU HAVE NOT HEARD FROM US/SPECIALIST IN TWO WEEKS, PLEASE GIVE [place] A CALL AT [phone] X [phone].   THE PATIENT IS ENCOURAGED TO  PRACTICE SOCIAL DISTANCING DUE TO THE COVID-19 PANDEMIC.

## 2022-05-03 NOTE — Patient Instructions (Signed)

## 2022-05-04 LAB — CBC
Hematocrit: 35.1 % (ref 34.0–46.6)
Hemoglobin: 11.1 g/dL (ref 11.1–15.9)
MCH: 26.7 pg (ref 26.6–33.0)
MCHC: 31.6 g/dL (ref 31.5–35.7)
MCV: 84 fL (ref 79–97)
Platelets: 261 10*3/uL (ref 150–450)
RBC: 4.16 x10E6/uL (ref 3.77–5.28)
RDW: 14.8 % (ref 11.7–15.4)
WBC: 7.2 10*3/uL (ref 3.4–10.8)

## 2022-05-04 LAB — CMP14+EGFR
ALT: 13 IU/L (ref 0–32)
AST: 19 IU/L (ref 0–40)
Albumin/Globulin Ratio: 1.2 (ref 1.2–2.2)
Albumin: 4.1 g/dL (ref 3.8–4.8)
Alkaline Phosphatase: 65 IU/L (ref 44–121)
BUN/Creatinine Ratio: 13 (ref 12–28)
BUN: 7 mg/dL — ABNORMAL LOW (ref 8–27)
Bilirubin Total: 0.3 mg/dL (ref 0.0–1.2)
CO2: 27 mmol/L (ref 20–29)
Calcium: 8.9 mg/dL (ref 8.7–10.3)
Chloride: 101 mmol/L (ref 96–106)
Creatinine, Ser: 0.53 mg/dL — ABNORMAL LOW (ref 0.57–1.00)
Globulin, Total: 3.4 g/dL (ref 1.5–4.5)
Glucose: 79 mg/dL (ref 70–99)
Potassium: 4.2 mmol/L (ref 3.5–5.2)
Sodium: 142 mmol/L (ref 134–144)
Total Protein: 7.5 g/dL (ref 6.0–8.5)
eGFR: 93 mL/min/{1.73_m2} (ref >59)

## 2022-05-05 ENCOUNTER — Ambulatory Visit: Payer: Medicare Other | Admitting: Internal Medicine

## 2022-05-16 DIAGNOSIS — K219 Gastro-esophageal reflux disease without esophagitis: Secondary | ICD-10-CM | POA: Diagnosis not present

## 2022-05-16 DIAGNOSIS — R070 Pain in throat: Secondary | ICD-10-CM | POA: Diagnosis not present

## 2022-06-06 ENCOUNTER — Other Ambulatory Visit: Payer: Self-pay | Admitting: Internal Medicine

## 2022-06-06 ENCOUNTER — Ambulatory Visit (INDEPENDENT_AMBULATORY_CARE_PROVIDER_SITE_OTHER): Payer: Medicare Other | Admitting: Internal Medicine

## 2022-06-06 VITALS — BP 120/68 | HR 78 | Temp 97.8°F | Ht 59.0 in | Wt 118.2 lb

## 2022-06-06 DIAGNOSIS — R1013 Epigastric pain: Secondary | ICD-10-CM

## 2022-06-06 DIAGNOSIS — K219 Gastro-esophageal reflux disease without esophagitis: Secondary | ICD-10-CM

## 2022-06-06 DIAGNOSIS — R109 Unspecified abdominal pain: Secondary | ICD-10-CM

## 2022-06-06 MED ORDER — FAMOTIDINE 20 MG PO TABS: 20.0000 mg | ORAL_TABLET | Freq: Every day | ORAL | 1 refills | Status: DC

## 2022-06-06 NOTE — Progress Notes (Unsigned)
Hershal Coria Martin,acting as a Neurosurgeon for [name], MD.,have documented all relevant documentation on the behalf of [name], MD,as directed by  [name], MD while in the presence of [name], MD.    Subjective:     Patient ID: Samantha [name] , female    DOB: [birth year] , 81 y.o.   MRN: [id number]   Chief Complaint  Patient presents with   Abdominal Pain    HPI  Patient presents today for abdominal pain. Her sx started about the week of Thanksgiving. She did have some diarrhea as well, no fever/chills. Patient states the diarrhea has stopped but is now having burning in her stomach. She states she is no longer on reflux therapy because her ENT doctor told her to stop the medication. She has been off of the medication for at least 1-2 months.  BP Readings from Last 3 Encounters: 06/06/22 : 120/68 05/03/22 : 112/72 04/21/22 : 122/74     Abdominal Pain This is a new problem. The current episode started 1 to 4 weeks ago. The onset quality is gradual. The problem has been unchanged. The pain is located in the generalized abdominal region. The pain is at a severity of 6/10. The pain is moderate. The quality of the pain is burning. Associated symptoms include belching and nausea. Pertinent negatives include no constipation or headaches. The pain is relieved by Belching. The treatment provided moderate relief.     Past Medical History:  Diagnosis Date   Asthma    GERD (gastroesophageal reflux disease)    H. pylori infection    Hypercholesteremia    UTI (lower urinary tract infection)      Family History  Problem Relation Age of Onset   Cancer Mother    Asthma Father    Allergic rhinitis Father    Food Allergy Father    Urticaria Neg Hx    Eczema Neg Hx    Angioedema Neg Hx      Current Outpatient Medications:    famotidine (PEPCID) 20 MG tablet, Take 1 tablet (20 mg total) by mouth daily., Disp: 30 tablet, Rfl: 1   pravastatin (PRAVACHOL) 20 MG tablet,  TAKE 1 TABLET BY MOUTH DAILY., Disp: 90 tablet, Rfl: 1   SYMBICORT 160-4.5 MCG/ACT inhaler, Inhale 2 puffs into the lungs 2 (two) times daily., Disp: 10.2 g, Rfl: 5   Vitamin D, Ergocalciferol, (DRISDOL) 1.25 MG (50000 UNIT) CAPS capsule, Take 50,000 Units by mouth once a week., Disp: , Rfl:    albuterol (VENTOLIN HFA) 108 (90 Base) MCG/ACT inhaler, Inhale 2 puffs into the lungs every 4 (four) hours as needed for wheezing or shortness of breath. (Patient not taking: Reported on 05/03/2022), Disp: 18 g, Rfl: 1   EPINEPHrine 0.3 mg/0.3 mL IJ SOAJ injection, Inject 0.3 mg into the muscle as needed for anaphylaxis. Use as directed for severe allergic reaction (Patient not taking: Reported on 05/03/2022), Disp: 2 each, Rfl: 1  Current Facility-Administered Medications:    triamcinolone acetonide (KENALOG-40) injection 40 mg, 40 mg, Intramuscular, Once, Ghumman, Ramandeep, NP   Allergies  Allergen Reactions   Ciprofloxacin Other (See Comments)   Contrast Media [Iodinated Contrast Media] Other (See Comments)    Patient breaks out in hives pt has had ct scans in the past with premeds and has not had reaction.   Shellfish Allergy Other (See Comments)   Other Nausea And Vomiting, Rash and Other (See Comments)    Seafood allergy  Review of Systems  Constitutional: Negative.   Respiratory: Negative.    Cardiovascular: Negative.   Gastrointestinal:  Positive for abdominal pain and nausea. Negative for constipation.  Neurological: Negative.  Negative for headaches.  Psychiatric/Behavioral: Negative.       Today's Vitals   06/06/22 1428  BP: 120/68  Pulse: 78  Temp: 97.8 F (36.6 C)  TempSrc: Oral  Weight: 118 lb 3.2 oz (53.6 kg)  Height: 4\' 11"  (1.499 m)  PainSc: 8   PainLoc: Abdomen   Body mass index is 23.87 kg/m.  Wt Readings from Last 3 Encounters:  06/06/22 118 lb 3.2 oz (53.6 kg)  05/03/22 115 lb 12.8 oz (52.5 kg)  04/21/22 112 lb (50.8 kg)    Objective:  Physical  Exam Vitals and nursing note reviewed.  Constitutional:      Appearance: Normal appearance.  HENT:     Head: Normocephalic and atraumatic.  Cardiovascular:     Rate and Rhythm: Normal rate and regular rhythm.     Heart sounds: Normal heart sounds.  Pulmonary:     Effort: Pulmonary effort is normal.     Breath sounds: Normal breath sounds.  Abdominal:     General: Abdomen is flat. Bowel sounds are normal. There is no distension or abdominal bruit.     Palpations: Abdomen is soft.     Tenderness: There is abdominal tenderness in the epigastric area. There is no right CVA tenderness, left CVA tenderness, guarding or rebound.  Skin:    General: Skin is warm.  Neurological:     General: No focal deficit present.     Mental Status: She is alert.  Psychiatric:        Mood and Affect: Mood normal.        Behavior: Behavior normal.       Assessment And Plan:     1. Epigastric pain Comments: Likely due to GERD. Advised to stop eating 3 hours prior to lying down.  2. Gastroesophageal reflux disease without esophagitis Comments: Previously taken off of omeprazole by ENT, chart notes not yet available. His ofc was contacted, request was made. Will start famotidine daily. She will f/u in four weeks. If persistent, will switch to PPI therapy.    Patient was given opportunity to ask questions. Patient verbalized understanding of the plan and was able to repeat key elements of the plan. All questions were answered to their satisfaction.   I, 04/23/22, MD, have reviewed all documentation for this visit. The documentation on 06/06/22 for the exam, diagnosis, procedures, and orders are all accurate and complete.   IF YOU HAVE BEEN REFERRED TO A SPECIALIST, IT MAY TAKE 1-2 WEEKS TO SCHEDULE/PROCESS THE REFERRAL. IF YOU HAVE NOT HEARD FROM US/SPECIALIST IN TWO WEEKS, PLEASE GIVE 14/04/23 A CALL AT [phone].   THE PATIENT IS ENCOURAGED TO PRACTICE SOCIAL DISTANCING DUE TO THE COVID-19  PANDEMIC.

## 2022-06-06 NOTE — Patient Instructions (Signed)
Abdominal Pain, Adult Many things can cause belly (abdominal) pain. Most times, belly pain is not dangerous. Many cases of belly pain can be watched and treated at home. Sometimes, though, belly pain is serious. Your doctor will try to find the cause of your belly pain. Follow these instructions at home:  Medicines Take over-the-counter and prescription medicines only as told by your doctor. Do not take medicines that help you poop (laxatives) unless told by your doctor. General instructions Watch your belly pain for any changes. Drink enough fluid to keep your pee (urine) pale yellow. Keep all follow-up visits as told by your doctor. This is important. Contact a doctor if: Your belly pain changes or gets worse. You are not hungry, or you lose weight without trying. You are having trouble pooping (constipated) or have watery poop (diarrhea) for more than 2-3 days. You have pain when you pee or poop. Your belly pain wakes you up at night. Your pain gets worse with meals, after eating, or with certain foods. You are vomiting and cannot keep anything down. You have a fever. You have blood in your pee. Get help right away if: Your pain does not go away as soon as your doctor says it should. You cannot stop vomiting. Your pain is only in areas of your belly, such as the right side or the left lower part of the belly. You have bloody or black poop, or poop that looks like tar. You have very bad pain, cramping, or bloating in your belly. You have signs of not having enough fluid or water in your body (dehydration), such as: Dark pee, very little pee, or no pee. Cracked lips. Dry mouth. Sunken eyes. Sleepiness. Weakness. You have trouble breathing or chest pain. Summary Many cases of belly pain can be watched and treated at home. Watch your belly pain for any changes. Take over-the-counter and prescription medicines only as told by your doctor. Contact a doctor if your belly pain  changes or gets worse. Get help right away if you have very bad pain, cramping, or bloating in your belly. This information is not intended to replace advice given to you by your health care provider. Make sure you discuss any questions you have with your health care provider. Document Revised: 10/29/2018 Document Reviewed: 10/29/2018 Elsevier Patient Education  2023 Elsevier Inc.  

## 2022-06-07 ENCOUNTER — Ambulatory Visit: Payer: Medicare Other | Admitting: Internal Medicine

## 2022-06-07 ENCOUNTER — Encounter: Payer: Self-pay | Admitting: Internal Medicine

## 2022-06-20 ENCOUNTER — Encounter: Payer: Self-pay | Admitting: Family

## 2022-06-20 ENCOUNTER — Ambulatory Visit: Payer: Medicare Other | Admitting: Family Medicine

## 2022-06-20 ENCOUNTER — Telehealth: Payer: Self-pay | Admitting: Allergy and Immunology

## 2022-06-20 ENCOUNTER — Ambulatory Visit (INDEPENDENT_AMBULATORY_CARE_PROVIDER_SITE_OTHER): Payer: Medicare Other | Admitting: Family

## 2022-06-20 VITALS — BP 130/50 | HR 62 | Temp 98.0°F | Resp 16 | Wt 118.7 lb

## 2022-06-20 DIAGNOSIS — Z91013 Allergy to seafood: Secondary | ICD-10-CM | POA: Diagnosis not present

## 2022-06-20 DIAGNOSIS — J3089 Other allergic rhinitis: Secondary | ICD-10-CM

## 2022-06-20 DIAGNOSIS — K219 Gastro-esophageal reflux disease without esophagitis: Secondary | ICD-10-CM

## 2022-06-20 DIAGNOSIS — J4550 Severe persistent asthma, uncomplicated: Secondary | ICD-10-CM

## 2022-06-20 MED ORDER — ALBUTEROL SULFATE HFA 108 (90 BASE) MCG/ACT IN AERS: 2.0000 | INHALATION_SPRAY | RESPIRATORY_TRACT | 1 refills | Status: DC | PRN

## 2022-06-20 MED ORDER — SYMBICORT 160-4.5 MCG/ACT IN AERO
2.0000 | INHALATION_SPRAY | Freq: Two times a day (BID) | RESPIRATORY_TRACT | 5 refills | Status: DC
Start: 2022-06-20 — End: 2022-12-20

## 2022-06-20 NOTE — Telephone Encounter (Signed)
Patient requests to see only Dr Lucie Leather for all of her appointments.

## 2022-06-20 NOTE — Progress Notes (Deleted)
   [address] [place] [address] Dept: [phone]  FOLLOW UP NOTE  Patient ID: Samantha [name], female    DOB: 1940-07-21  Age: 81 y.o. MRN: [id number] Date of Office Visit: 06/20/2022  Assessment  Chief Complaint: No chief complaint on file.  HPI Samantha [name] is an 81 year old female who presents to the clinic for follow-up visit.  She was last seen in this clinic on 12/28/2021 by Dr. Lucie Leather for evaluation of asthma, allergic rhinitis, reflux, and food allergy to seafood.  Her last environmental allergy testing was on 09/26/2017 and was positive to grass pollen, weed pollen, tree pollen, ragweed pollen, and dust mite.  Skin prick testing to shellfish and fish was negative at that time   Drug Allergies:  Allergies  Allergen Reactions   Ciprofloxacin Other (See Comments)   Contrast Media [Iodinated Contrast Media] Other (See Comments)    Patient breaks out in hives pt has had ct scans in the past with premeds and has not had reaction.   Shellfish Allergy Other (See Comments)   Other Nausea And Vomiting, Rash and Other (See Comments)    Seafood allergy    Physical Exam: There were no vitals taken for this visit.   Physical Exam  Diagnostics:    Assessment and Plan: No diagnosis found.  No orders of the defined types were placed in this encounter.   There are no Patient Instructions on file for this visit.  No follow-ups on file.    Thank you for the opportunity to care for this patient.  Please do not hesitate to contact me with questions.  Thermon Leyland, FNP [facility]

## 2022-06-20 NOTE — Patient Instructions (Addendum)
  1.  Continue to perform Allergen avoidance measures - No seafood  2.  Continue to Treat and prevent inflammation:   A.  Symbicort 160 - 2 inhalations twice a day with spacer  3.  Continue to Treat and prevent reflux as directed by GI doctor   4.  If needed:   A.  Proventil HFA or similar 2 puffs every 4-6 hours  B.  Nasal saline  C.  Epi-Pen  5. Return to clinic in 6 months or earlier if problem  6. Obtain fall flu vaccine and RSV vaccine

## 2022-06-20 NOTE — Patient Instructions (Incomplete)
Severe persistent asthma Recommended getting a chest x-ray due to off-and-on pain in chest and productive cough.  She does not wish to do this at this time Start Spiriva Respimat 1.25 mcg 2 puffs once a day to help prevent cough and wheeze Continue Symbicort 160/4.5 mcg using 2 puffs twice a day with spacer to help prevent cough and wheeze May use albuterol 2 puffs every 4-6 hours as needed for cough, wheeze, tightness in chest, or shortness of breath. Asthma control goals:  Full participation in all desired activities (may need albuterol before activity) Albuterol use two time or less a week on average (not counting use with activity) Cough interfering with sleep two time or less a month Oral steroids no more than once a year No hospitalizations  Allergic rhinitis Continue saline rinse as needed for nasal symptoms  Laryngopharyngeal reflux disease Continue omeprazole 40 mg twice a day Continue dietary and lifestyle modification Keep follow up appointment with GI on 02/20/2021 to discuss poorly controlled reflux symptoms  Seafood allergy Avoid seafood. In case of an allergic reaction, give Benadryl 4 teaspoonfuls every 6 hours, and if life-threatening symptoms occur, inject with EpiPen 0.3 mg.  Epistaxis Pinch both nostrils while leaning forward for at least 5 minutes before checking to see if the bleeding has stopped. If bleeding is not controlled within 5-10 minutes apply a cotton ball soaked with oxymetazoline (Afrin) to the bleeding nostril for a few seconds.  If the problem persists or worsens a referral to ENT for further evaluation may be necessary.  Schedule an appointment to discuss your continued chest pain that you have had off/on for past 5 years   Please let us know if this treatment plan is not working well for you Schedule a follow-up appointment in 2 months or sooner if needed

## 2022-06-20 NOTE — Progress Notes (Signed)
Samantha [name] [id number] Dept: [phone]  FOLLOW UP NOTE  Patient ID: Samantha [name], female    DOB: 1940/09/26  Age: 81 y.o. MRN: [id number] Date of Office Visit: 06/20/2022  Assessment  Chief Complaint: Follow-up  HPI Samantha [name] is an 81 year old female who presents today for follow-up of severe persistent asthma, allergic rhinitis, laryngopharyngeal reflux disease, and seafood allergy.  She was last seen on December 28, 2021 by Dr. Neldon Mc.  She denies any new diagnosis or surgery since her last office visit.  An interpreter is here with Samantha [name] today via [employer].  Severe persistent asthma: She continues to take Symbicort 160/4.5 mcg 2 puffs twice a day without a spacer.  She does have a spacer at home.  She also has albuterol to use as needed.  She reports coughing and wheezing every now and then.  She denies tightness in her chest, shortness of breath, and nocturnal awakenings due to breathing problems.  Since her last office visit she has not required any systemic steroids or made any trips to the emergency room or urgent care due to breathing problems.  She uses her albuterol inhaler maybe 1-2 times a month.  Allergic rhinitis: She reports postnasal drip now and then that will make her cough.  She denies rhinorrhea and nasal congestion.  She has not had any sinus infections since we last saw her.  She uses sinus rinse twice a week.  Laryngopharyngeal reflux disease: He is currently taking famotidine daily and continues to follow with GI.  She reports that she last time she saw GI was in July.  She was having problems with abdominal pain and diarrhea, but mentions that she is no longer having the symptoms.  She reports reflux symptoms every now and then.  Seafood allergy: She continues to avoid seafood without any accidental ingestion or use of her epinephrine autoinjector device.   Drug Allergies:  Allergies  Allergen Reactions   Ciprofloxacin Other (See Comments)   Contrast Media  [Iodinated Contrast Media] Other (See Comments)    Patient breaks out in hives pt has had ct scans in the past with premeds and has not had reaction.   Shellfish Allergy Other (See Comments)   Other Nausea And Vomiting, Rash and Other (See Comments)    Seafood allergy    Review of Systems: Review of Systems  Constitutional:  Negative for chills and fever.  Eyes:        Denies itchy watery eyes  Respiratory:  Positive for cough and wheezing. Negative for shortness of breath.        Reports cough and wheeze now and then. Denies tightness in chest, shortness of breath and nocturnal awakenings due to breathing problems  Cardiovascular:  Positive for chest pain.       Reports occasional chest pain. Instructed to speak with primary care physician  Gastrointestinal:        Reports reflux now and then  Genitourinary:  Negative for frequency.  Skin:  Negative for itching and rash.  Neurological:  Negative for headaches.  Endo/Heme/Allergies:  Positive for environmental allergies.     Physical Exam: BP (!) 130/50   Pulse 62   Temp 98 F (36.7 C) (Temporal)   Resp 16   Wt 118 lb 11.2 oz (53.8 kg)   SpO2 97%   BMI 23.97 kg/m    Physical Exam Constitutional:      Appearance: Normal appearance.  HENT:     Head: Normocephalic and atraumatic.  Comments: Pharynx normal. Eyes normal. Ears normal. Nose normal    Right Ear: Tympanic membrane, ear canal and external ear normal.     Left Ear: Tympanic membrane, ear canal and external ear normal.     Nose: Nose normal.     Mouth/Throat:     Mouth: Mucous membranes are moist.     Pharynx: Oropharynx is clear.  Eyes:     Conjunctiva/sclera: Conjunctivae normal.  Cardiovascular:     Rate and Rhythm: Regular rhythm.     Heart sounds: Normal heart sounds.  Pulmonary:     Effort: Pulmonary effort is normal.     Breath sounds: Normal breath sounds.     Comments: Lungs clear to auscultation Musculoskeletal:     Cervical back: Neck  supple.  Skin:    General: Skin is warm.  Neurological:     Mental Status: She is alert and oriented to person, place, and time.  Psychiatric:        Mood and Affect: Mood normal.        Behavior: Behavior normal.        Thought Content: Thought content normal.        Judgment: Judgment normal.     Diagnostics:  Not completed today due to spirometry not working  Assessment and Plan: 1. Asthma, severe persistent, well-controlled   2. Other allergic rhinitis   3. LPRD (laryngopharyngeal reflux disease)   4. Seafood allergy     Meds ordered this encounter  Medications   SYMBICORT 160-4.5 MCG/ACT inhaler    Sig: Inhale 2 puffs into the lungs 2 (two) times daily.    Dispense:  10.2 g    Refill:  5   albuterol (VENTOLIN HFA) 108 (90 Base) MCG/ACT inhaler    Sig: Inhale 2 puffs into the lungs every 4 (four) hours as needed for wheezing or shortness of breath.    Dispense:  18 g    Refill:  1    Patient Instructions   1.  Continue to perform Allergen avoidance measures - No seafood  2.  Continue to Treat and prevent inflammation:   A.  Symbicort 160 - 2 inhalations twice a day with spacer  3.  Continue to Treat and prevent reflux as directed by GI doctor   4.  If needed:   A.  Proventil HFA or similar 2 puffs every 4-6 hours  B.  Nasal saline  C.  Epi-Pen  5. Return to clinic in 6 months or earlier if problem  6. Obtain fall flu vaccine and RSV vaccine  Return in about 6 months (around 12/20/2022), or if symptoms worsen or fail to improve.    Thank you for the opportunity to care for this patient.  Please do not hesitate to contact me with questions.  Nehemiah Settle, FNP [facility]

## 2022-06-21 ENCOUNTER — Ambulatory Visit: Payer: Medicare Other | Admitting: Allergy and Immunology

## 2022-06-29 ENCOUNTER — Ambulatory Visit: Payer: Medicare Other

## 2022-07-01 ENCOUNTER — Telehealth: Payer: Self-pay

## 2022-07-01 NOTE — Patient Instructions (Signed)

## 2022-07-01 NOTE — Telephone Encounter (Signed)
Attempted to call patient 3 times to complete AWV. Patient son reports going out of town & not being with her at the moment I called. Called the home phone twice, second answer someone picked up but did not understand. AWV incomplete.

## 2022-07-11 DIAGNOSIS — R197 Diarrhea, unspecified: Secondary | ICD-10-CM | POA: Diagnosis not present

## 2022-07-11 DIAGNOSIS — R1084 Generalized abdominal pain: Secondary | ICD-10-CM | POA: Diagnosis not present

## 2022-07-11 DIAGNOSIS — R194 Change in bowel habit: Secondary | ICD-10-CM | POA: Diagnosis not present

## 2022-07-12 NOTE — Telephone Encounter (Signed)
Chmg-error.  

## 2022-07-19 ENCOUNTER — Ambulatory Visit (INDEPENDENT_AMBULATORY_CARE_PROVIDER_SITE_OTHER): Payer: 59 | Admitting: Internal Medicine

## 2022-07-19 ENCOUNTER — Encounter: Payer: Self-pay | Admitting: Internal Medicine

## 2022-07-19 VITALS — BP 118/80 | HR 61 | Temp 98.1°F | Ht 59.0 in | Wt 118.0 lb

## 2022-07-19 DIAGNOSIS — Z6823 Body mass index (BMI) 23.0-23.9, adult: Secondary | ICD-10-CM | POA: Diagnosis not present

## 2022-07-19 DIAGNOSIS — J4540 Moderate persistent asthma, uncomplicated: Secondary | ICD-10-CM | POA: Diagnosis not present

## 2022-07-19 DIAGNOSIS — R051 Acute cough: Secondary | ICD-10-CM | POA: Diagnosis not present

## 2022-07-19 MED ORDER — BENZONATATE 100 MG PO CAPS: 100.0000 mg | ORAL_CAPSULE | Freq: Four times a day (QID) | ORAL | 0 refills | Status: DC | PRN

## 2022-07-19 MED ORDER — TRIAMCINOLONE ACETONIDE 40 MG/ML IJ SUSP
40.0000 mg | Freq: Once | INTRAMUSCULAR | Status: AC
  Administered 2022-07-19: 40 mg via INTRAMUSCULAR

## 2022-07-19 NOTE — Progress Notes (Signed)
Rich Brave Llittleton,acting as a Education administrator for Maximino Greenland, MD.,have documented all relevant documentation on the behalf of Maximino Greenland, MD,as directed by  Maximino Greenland, MD while in the presence of Maximino Greenland, MD.    Subjective:     Patient ID: Samantha [name] , female    DOB: 04/20/1941 , 82 y.o.   MRN: [id number]   Chief Complaint  Patient presents with   Cough    HPI  Patient presents today for a cough. She reports she has had a cough since 07/08/22. She denies fever/chills/headache. She does c/o body aches, these started within the past week. Denies ill contacts. Cough is non-productive.   Cough This is a new problem. The current episode started 1 to 4 weeks ago. The problem has been unchanged. The cough is Non-productive. Pertinent negatives include no fever, headaches or shortness of breath. Associated symptoms comments: Congestion . Nothing aggravates the symptoms. Treatments tried: alka seltzer plus. The treatment provided mild relief.     Past Medical History:  Diagnosis Date   Asthma    GERD (gastroesophageal reflux disease)    H. pylori infection    Hypercholesteremia    UTI (lower urinary tract infection)      Family History  Problem Relation Age of Onset   Cancer Mother    Asthma Father    Allergic rhinitis Father    Food Allergy Father    Urticaria Neg Hx    Eczema Neg Hx    Angioedema Neg Hx      Current Outpatient Medications:    albuterol (VENTOLIN HFA) 108 (90 Base) MCG/ACT inhaler, Inhale 2 puffs into the lungs every 4 (four) hours as needed for wheezing or shortness of breath., Disp: 18 g, Rfl: 1   benzonatate (TESSALON PERLES) 100 MG capsule, Take 1 capsule (100 mg total) by mouth every 6 (six) hours as needed for cough., Disp: 30 capsule, Rfl: 0   EPINEPHrine 0.3 mg/0.3 mL IJ SOAJ injection, Inject 0.3 mg into the muscle as needed for anaphylaxis. Use as directed for severe allergic reaction, Disp: 2 each, Rfl: 1   famotidine (PEPCID) 20 MG  tablet, TAKE 1 TABLET(20 MG) BY MOUTH DAILY, Disp: 90 tablet, Rfl: 1   pravastatin (PRAVACHOL) 20 MG tablet, TAKE 1 TABLET BY MOUTH DAILY., Disp: 90 tablet, Rfl: 1   SYMBICORT 160-4.5 MCG/ACT inhaler, Inhale 2 puffs into the lungs 2 (two) times daily., Disp: 10.2 g, Rfl: 5   Vitamin D, Ergocalciferol, (DRISDOL) 1.25 MG (50000 UNIT) CAPS capsule, Take 50,000 Units by mouth once a week., Disp: , Rfl:   Current Facility-Administered Medications:    triamcinolone acetonide (KENALOG-40) injection 40 mg, 40 mg, Intramuscular, Once, Ghumman, Ramandeep, NP   Allergies  Allergen Reactions   Ciprofloxacin Other (See Comments)   Contrast Media [Iodinated Contrast Media] Other (See Comments)    Patient breaks out in hives pt has had ct scans in the past with premeds and has not had reaction.   Shellfish Allergy Other (See Comments)   Other Nausea And Vomiting, Rash and Other (See Comments)    Seafood allergy     Review of Systems  Constitutional: Negative.  Negative for fever.  Respiratory:  Positive for cough. Negative for shortness of breath.   Cardiovascular: Negative.   Gastrointestinal: Negative.   Neurological: Negative.  Negative for headaches.  Psychiatric/Behavioral: Negative.       Today's Vitals   07/19/22 1426  BP: 118/80  Pulse: 61  Temp: 98.1 F (  36.7 C)  Weight: 118 lb (53.5 kg)  Height: 4\' 11"  (1.499 m)  PainSc: 0-No pain   Body mass index is 23.83 kg/m.  Wt Readings from Last 3 Encounters:  07/19/22 118 lb (53.5 kg)  06/20/22 118 lb 11.2 oz (53.8 kg)  06/06/22 118 lb 3.2 oz (53.6 kg)     Objective:  Physical Exam Vitals and nursing note reviewed.  Constitutional:      Appearance: Normal appearance.  HENT:     Head: Normocephalic and atraumatic.     Right Ear: Tympanic membrane, ear canal and external ear normal. There is no impacted cerumen.     Left Ear: Tympanic membrane, ear canal and external ear normal. There is no impacted cerumen.     Nose: No  congestion or rhinorrhea.     Mouth/Throat:     Pharynx: No posterior oropharyngeal erythema.  Eyes:     Extraocular Movements: Extraocular movements intact.  Cardiovascular:     Rate and Rhythm: Normal rate and regular rhythm.     Heart sounds: Normal heart sounds.  Pulmonary:     Effort: Pulmonary effort is normal.     Breath sounds: Normal breath sounds.     Comments: Decreased breath sounds b/l Musculoskeletal:     Cervical back: Normal range of motion.  Skin:    General: Skin is warm.  Neurological:     General: No focal deficit present.     Mental Status: She is alert.  Psychiatric:        Mood and Affect: Mood normal.        Behavior: Behavior normal.      Assessment And Plan:     1. Acute cough Comments: Possibly viral URI. She is afebrile. I will check resp. panel and treat as indicated. She was given Kenalog, 40mg  IM x 1. Advised to call back if sx persist. - triamcinolone acetonide (KENALOG-40) injection 40 mg - Respiratory Panel w/ SARS-CoV2 - benzonatate (TESSALON PERLES) 100 MG capsule; Take 1 capsule (100 mg total) by mouth every 6 (six) hours as needed for cough.  Dispense: 30 capsule; Refill: 0  2. Moderate persistent asthma without complication Comments: Reminded to use her inhaler daily as prescribed.  3. BMI 23.0-23.9, adult     Patient was given opportunity to ask questions. Patient verbalized understanding of the plan and was able to repeat key elements of the plan. All questions were answered to their satisfaction.   I, Maximino Greenland, MD, have reviewed all documentation for this visit. The documentation on 07/19/22 for the exam, diagnosis, procedures, and orders are all accurate and complete.   IF YOU HAVE BEEN REFERRED TO A SPECIALIST, IT MAY TAKE 1-2 WEEKS TO SCHEDULE/PROCESS THE REFERRAL. IF YOU HAVE NOT HEARD FROM US/SPECIALIST IN TWO WEEKS, PLEASE GIVE [place] A CALL AT [phone] X [phone].   THE PATIENT IS ENCOURAGED TO PRACTICE SOCIAL DISTANCING  DUE TO THE COVID-19 PANDEMIC.

## 2022-07-21 ENCOUNTER — Ambulatory Visit (INDEPENDENT_AMBULATORY_CARE_PROVIDER_SITE_OTHER): Payer: 59

## 2022-07-21 VITALS — BP 128/64 | HR 69 | Temp 98.2°F | Ht 59.0 in | Wt 117.2 lb

## 2022-07-21 DIAGNOSIS — Z0000 Encounter for general adult medical examination without abnormal findings: Secondary | ICD-10-CM

## 2022-07-21 LAB — RESPIRATORY PANEL W/ SARS-COV2
Adenovirus: NOT DETECTED
Bordetella parapertussis: NOT DETECTED
Bordetella pertussis: NOT DETECTED
Chlamydophila pneumoniae: NOT DETECTED
Coronavirus 229E: DETECTED — AB
Coronavirus HKU1: NOT DETECTED
Coronavirus NL63: NOT DETECTED
Coronavirus OC43: NOT DETECTED
Human Metapneumovirus: NOT DETECTED
Human Rhinovirus/Enterovirus: NOT DETECTED
Influenza A/H1-2009: NOT DETECTED
Influenza A/H1: NOT DETECTED
Influenza A/H3: NOT DETECTED
Influenza A: NOT DETECTED
Influenza B: NOT DETECTED
Mycoplasma pneumoniae: NOT DETECTED
Parainfluenza 1: NOT DETECTED
Parainfluenza 2: NOT DETECTED
Parainfluenza 3: NOT DETECTED
Parainfluenza 4: NOT DETECTED
Respiratory Syncytial Virus: NOT DETECTED
SARS-CoV-2: NOT DETECTED

## 2022-07-21 NOTE — Progress Notes (Signed)
Subjective:   Samantha [name] is a 82 y.o. female who presents for Medicare Annual (Subsequent) preventive examination.  Review of Systems     Cardiac Risk Factors include: advanced age (>102men, >63 women);dyslipidemia     Objective:    Today's Vitals   07/21/22 0819  BP: 128/64  Pulse: 69  Temp: 98.2 F (36.8 C)  TempSrc: Oral  SpO2: 99%  Weight: 117 lb 3.2 oz (53.2 kg)  Height: 4\' 11"  (1.499 m)   Body mass index is 23.67 kg/m.     07/21/2022    8:32 AM 06/17/2021    8:43 AM 06/11/2020    2:47 PM 06/06/2019    9:50 AM 09/27/2018    6:04 PM 05/21/2018    2:30 PM 05/21/2018    8:21 AM  Advanced Directives  Does Patient Have a Medical Advance Directive? No Yes No No No Yes Yes  Type of Advance Directive  Living will    [place];Living will [place];Living will  Does patient want to make changes to medical advance directive?       No - Patient declined  Copy of [place] in Chart?      No - copy requested No - copy requested  Would patient like information on creating a medical advance directive? No - Patient declined  No - Patient declined  Yes (ED - Information included in AVS) No - Patient declined No - Patient declined    Current Medications (verified) Outpatient Encounter Medications as of 07/21/2022  Medication Sig   albuterol (VENTOLIN HFA) 108 (90 Base) MCG/ACT inhaler Inhale 2 puffs into the lungs every 4 (four) hours as needed for wheezing or shortness of breath.   benzonatate (TESSALON PERLES) 100 MG capsule Take 1 capsule (100 mg total) by mouth every 6 (six) hours as needed for cough.   EPINEPHrine 0.3 mg/0.3 mL IJ SOAJ injection Inject 0.3 mg into the muscle as needed for anaphylaxis. Use as directed for severe allergic reaction   famotidine (PEPCID) 20 MG tablet TAKE 1 TABLET(20 MG) BY MOUTH DAILY   pravastatin (PRAVACHOL) 20 MG tablet TAKE 1 TABLET BY MOUTH DAILY.   SYMBICORT 160-4.5 MCG/ACT inhaler  Inhale 2 puffs into the lungs 2 (two) times daily.   Vitamin D, Ergocalciferol, (DRISDOL) 1.25 MG (50000 UNIT) CAPS capsule Take 50,000 Units by mouth once a week.   Facility-Administered Encounter Medications as of 07/21/2022  Medication   triamcinolone acetonide (KENALOG-40) injection 40 mg    Allergies (verified) Ciprofloxacin, Contrast media [iodinated contrast media], Shellfish allergy, and Other   History: Past Medical History:  Diagnosis Date   Asthma    GERD (gastroesophageal reflux disease)    H. pylori infection    Hypercholesteremia    UTI (lower urinary tract infection)    Past Surgical History:  Procedure Laterality Date   ABDOMINAL HYSTERECTOMY     ABDOMINAL SURGERY     CESAREAN SECTION     CHOLECYSTECTOMY N/A 05/21/2018   Procedure: LAPAROSCOPIC CHOLECYSTECTOMY WITH ATTEMPTED INTRAOPERATIVE CHOLANGIOGRAM;  Surgeon: Johnathan Hausen, MD;  Location: [facility];  Service: General;  Laterality: N/A;   Family History  Problem Relation Age of Onset   Cancer Mother    Asthma Father    Allergic rhinitis Father    Food Allergy Father    Urticaria Neg Hx    Eczema Neg Hx    Angioedema Neg Hx    Social History   Socioeconomic History   Marital status:  Married    Spouse name: Not on file   Number of children: Not on file   Years of education: Not on file   Highest education level: Not on file  Occupational History   Occupation: retired  Tobacco Use   Smoking status: Never   Smokeless tobacco: Never  Vaping Use   Vaping Use: Never used  Substance and Sexual Activity   Alcohol use: No    Alcohol/week: 0.0 standard drinks of alcohol   Drug use: No   Sexual activity: Not Currently  Other Topics Concern   Not on file  Social History Narrative   Not on file   Social Determinants of Health   Financial Resource Strain: Low Risk  (07/21/2022)   Overall Financial Resource Strain (CARDIA)    Difficulty of Paying Living Expenses: Not hard at all  Food Insecurity:  No Food Insecurity (07/21/2022)   Hunger Vital Sign    Worried About Running Out of Food in the Last Year: Never true    [place] in the Last Year: Never true  Transportation Needs: No Transportation Needs (07/21/2022)   PRAPARE - Hydrologist (Medical): No    Lack of Transportation (Non-Medical): No  Physical Activity: Inactive (07/21/2022)   Exercise Vital Sign    Days of Exercise per Week: 0 days    Minutes of Exercise per Session: 0 min  Stress: No Stress Concern Present (07/21/2022)   [place]    Feeling of Stress : Not at all  Social Connections: Not on file    Tobacco Counseling Counseling given: Not Answered   Clinical Intake:  Pre-visit preparation completed: Yes  Pain : No/denies pain     Nutritional Status: BMI of 19-24  Normal Nutritional Risks: None Diabetes: No  How often do you need to have someone help you when you read instructions, pamphlets, or other written materials from your doctor or pharmacy?: 3 - Sometimes  Diabetic? no  Interpreter Needed?: Yes Interpreter Agency: [employer] Interpreter Name: Sueanne Margarita Arul Patient Declined Interpreter : No  Information entered by :: NAllen LPN   Activities of Daily Living    07/21/2022    8:34 AM  In your present state of health, do you have any difficulty performing the following activities:  Hearing? 0  Vision? 0  Difficulty concentrating or making decisions? 0  Walking or climbing stairs? 1  Comment gets tired  Dressing or bathing? 0  Doing errands, shopping? 0  Preparing Food and eating ? N  Using the Toilet? N  In the past six months, have you accidently leaked urine? N  Do you have problems with loss of bowel control? N  Managing your Medications? N  Managing your Finances? N  Housekeeping or managing your Housekeeping? N    Patient Care Team: Glendale Chard, MD as PCP -  General (Internal Medicine)  Indicate any recent Medical Services you may have received from other than [facility] providers in the past year (date may be approximate).     Assessment:   This is a routine wellness examination for [place].  Hearing/Vision screen Vision Screening - Comments:: No regular eye exams  Dietary issues and exercise activities discussed: Current Exercise Habits: The patient does not participate in regular exercise at present   Goals Addressed             This Visit's Progress    Patient Stated  07/21/2022, no goals       Depression Screen    07/21/2022    8:33 AM 06/06/2022    2:27 PM 06/17/2021    8:45 AM 06/15/2021    9:49 AM 06/11/2020    2:49 PM 12/10/2019    9:29 AM 06/06/2019    9:52 AM  PHQ 2/9 Scores  PHQ - 2 Score 0 0 0 0 0 0 0  PHQ- 9 Score    0   0    Fall Risk    07/21/2022    8:33 AM 06/06/2022    2:27 PM 06/17/2021    8:45 AM 06/15/2021    9:50 AM 06/11/2020    2:48 PM  [place] in the past year? 0 0 0 0 0  Number falls in past yr: 0 0  0   Injury with Fall? 0 0  0   Risk for fall due to : Medication side effect No Fall Risks Medication side effect  No Fall Risks  Follow up Falls prevention discussed;Education provided;Falls evaluation completed Falls evaluation completed Falls evaluation completed;Education provided;Falls prevention discussed  Falls evaluation completed;Education provided;Falls prevention discussed    FALL RISK PREVENTION PERTAINING TO THE HOME:  Any stairs in or around the home? No  If so, are there any without handrails? N/a Home free of loose throw rugs in walkways, pet beds, electrical cords, etc? Yes  Adequate lighting in your home to reduce risk of falls? Yes   ASSISTIVE DEVICES UTILIZED TO PREVENT FALLS:  Life alert? Yes  Use of a cane, walker or w/c? No  Grab bars in the bathroom? No  Shower chair or bench in shower? No  Elevated toilet seat or a handicapped toilet? No   TIMED UP AND  GO:  Was the test performed? Yes .  Length of time to ambulate 10 feet: 5 sec.   Gait steady and fast without use of assistive device  Cognitive Function:   6 CIT not administered due to language barrier, Seems to have normal cognition per direct observation.      05/17/2018   10:22 AM  6CIT Screen  What Year? 0 points  What month? 0 points  What time? 0 points  Count back from 20 0 points  Months in reverse 0 points  Repeat phrase 8 points  Total Score 8 points    Immunizations Immunization History  Administered Date(s) Administered   Fluad Quad(high Dose 65+) 04/14/2019, 06/11/2020, 03/30/2021, 04/21/2022   PFIZER(Purple Top)SARS-COV-2 Vaccination 10/28/2019, 11/08/2019   Pneumococcal Conjugate-13 06/10/2019   Pneumococcal Polysaccharide-23 06/24/2021   Pneumococcal-Unspecified 12/19/2005   Tdap 05/24/2017   Zoster Recombinat (Shingrix) 01/25/2022, 05/03/2022    TDAP status: Up to date  Flu Vaccine status: Up to date  Pneumococcal vaccine status: Up to date  Covid-19 vaccine status: Completed vaccines  Qualifies for Shingles Vaccine? Yes   Zostavax completed No   Shingrix Completed?: No.    Education has been provided regarding the importance of this vaccine. Patient has been advised to call insurance company to determine out of pocket expense if they have not yet received this vaccine. Advised may also receive vaccine at local pharmacy or Health Dept. Verbalized acceptance and understanding.  Screening Tests Health Maintenance  Topic Date Due   COVID-19 Vaccine (3 - Pfizer risk series) 12/06/2019   Medicare Annual Wellness (AWV)  07/22/2023   DTaP/Tdap/Td (2 - Td or Tdap) 05/25/2027   Pneumonia Vaccine 76+ Years old  Completed  INFLUENZA VACCINE  Completed   DEXA SCAN  Completed   Zoster Vaccines- Shingrix  Completed   HPV VACCINES  Aged Out    Health Maintenance  Health Maintenance Due  Topic Date Due   COVID-19 Vaccine (3 - Pfizer risk series)  12/06/2019    Colorectal cancer screening: No longer required.   Mammogram status: No longer required due to age.  Bone Density status: Completed 06/01/2016.   Lung Cancer Screening: (Low Dose CT Chest recommended if Age 33-80 years, 30 pack-year currently smoking OR have quit w/in 15years.) does not qualify.   Lung Cancer Screening Referral: no  Additional Screening:  Hepatitis C Screening: does not qualify;   Vision Screening: Recommended annual ophthalmology exams for early detection of glaucoma and other disorders of the eye. Is the patient up to date with their annual eye exam?  No  Who is the provider or what is the name of the office in which the patient attends annual eye exams? none If pt is not established with a provider, would they like to be referred to a provider to establish care? No .   Dental Screening: Recommended annual dental exams for proper oral hygiene  Community Resource Referral / Chronic Care Management: CRR required this visit?  No   CCM required this visit?  No      Plan:     I have personally reviewed and noted the following in the patient's chart:   Medical and social history Use of alcohol, tobacco or illicit drugs  Current medications and supplements including opioid prescriptions. Patient is not currently taking opioid prescriptions. Functional ability and status Nutritional status Physical activity Advanced directives List of other physicians Hospitalizations, surgeries, and ER visits in previous 12 months Vitals Screenings to include cognitive, depression, and falls Referrals and appointments  In addition, I have reviewed and discussed with patient certain preventive protocols, quality metrics, and best practice recommendations. A written personalized care plan for preventive services as well as general preventive health recommendations were provided to patient.     Barb Merino, LPN   1/32/4401   Nurse Notes:  none

## 2022-07-21 NOTE — Patient Instructions (Signed)
Samantha [name] , Thank you for taking time to come for your Medicare Wellness Visit. I appreciate your ongoing commitment to your health goals. Please review the following plan we discussed and let me know if I can assist you in the future.   These are the goals we discussed:  Goals       DIET - INCREASE WATER INTAKE (pt-stated)      Wants to be in good health. Drink plenty of water and eat healthy food.      Patient Stated      06/06/2019, no goals at this time      Patient Stated      06/11/2020, no goal      Patient Stated      06/17/2021, wants to get strong and healthy        This is a list of the screening recommended for you and due dates:  Health Maintenance  Topic Date Due   COVID-19 Vaccine (3 - Pfizer risk series) 12/06/2019   Medicare Annual Wellness Visit  06/17/2022   DTaP/Tdap/Td vaccine (2 - Td or Tdap) 05/25/2027   Pneumonia Vaccine  Completed   Flu Shot  Completed   DEXA scan (bone density measurement)  Completed   Zoster (Shingles) Vaccine  Completed   HPV Vaccine  Aged Out    Advanced directives: Advance directive discussed with you today. Even though you declined this today please call our office should you change your mind and we can give you the proper paperwork for you to fill out.  Conditions/risks identified: none  Next appointment: Follow up in one year for your annual wellness visit    Preventive Care 65 Years and Older, Female Preventive care refers to lifestyle choices and visits with your health care provider that can promote health and wellness. What does preventive care include? A yearly physical exam. This is also called an annual well check. Dental exams once or twice a year. Routine eye exams. Ask your health care provider how often you should have your eyes checked. Personal lifestyle choices, including: Daily care of your teeth and gums. Regular physical activity. Eating a healthy diet. Avoiding tobacco and drug use. Limiting alcohol  use. Practicing safe sex. Taking low-dose aspirin every day. Taking vitamin and mineral supplements as recommended by your health care provider. What happens during an annual well check? The services and screenings done by your health care provider during your annual well check will depend on your age, overall health, lifestyle risk factors, and family history of disease. Counseling  Your health care provider may ask you questions about your: Alcohol use. Tobacco use. Drug use. Emotional well-being. Home and relationship well-being. Sexual activity. Eating habits. History of falls. Memory and ability to understand (cognition). Work and work Statistician. Reproductive health. Screening  You may have the following tests or measurements: Height, weight, and BMI. Blood pressure. Lipid and cholesterol levels. These may be checked every 5 years, or more frequently if you are over 47 years old. Skin check. Lung cancer screening. You may have this screening every year starting at age 94 if you have a 30-pack-year history of smoking and currently smoke or have quit within the past 15 years. Fecal occult blood test (FOBT) of the stool. You may have this test every year starting at age 56. Flexible sigmoidoscopy or colonoscopy. You may have a sigmoidoscopy every 5 years or a colonoscopy every 10 years starting at age 52. Hepatitis C blood test. Hepatitis B blood test. Sexually  transmitted disease (STD) testing. Diabetes screening. This is done by checking your blood sugar (glucose) after you have not eaten for a while (fasting). You may have this done every 1-3 years. Bone density scan. This is done to screen for osteoporosis. You may have this done starting at age 34. Mammogram. This may be done every 1-2 years. Talk to your health care provider about how often you should have regular mammograms. Talk with your health care provider about your test results, treatment options, and if necessary,  the need for more tests. Vaccines  Your health care provider may recommend certain vaccines, such as: Influenza vaccine. This is recommended every year. Tetanus, diphtheria, and acellular pertussis (Tdap, Td) vaccine. You may need a Td booster every 10 years. Zoster vaccine. You may need this after age 45. Pneumococcal 13-valent conjugate (PCV13) vaccine. One dose is recommended after age 39. Pneumococcal polysaccharide (PPSV23) vaccine. One dose is recommended after age 51. Talk to your health care provider about which screenings and vaccines you need and how often you need them. This information is not intended to replace advice given to you by your health care provider. Make sure you discuss any questions you have with your health care provider. Document Released: 07/17/2015 Document Revised: 03/09/2016 Document Reviewed: 04/21/2015 Elsevier Interactive Patient Education  2017 [place] Prevention in the Home Falls can cause injuries. They can happen to people of all ages. There are many things you can do to make your home safe and to help prevent falls. What can I do on the outside of my home? Regularly fix the edges of walkways and driveways and fix any cracks. Remove anything that might make you trip as you walk through a door, such as a raised step or threshold. Trim any bushes or trees on the path to your home. Use bright outdoor lighting. Clear any walking paths of anything that might make someone trip, such as rocks or tools. Regularly check to see if handrails are loose or broken. Make sure that both sides of any steps have handrails. Any raised decks and porches should have guardrails on the edges. Have any leaves, snow, or ice cleared regularly. Use sand or salt on walking paths during winter. Clean up any spills in your garage right away. This includes oil or grease spills. What can I do in the bathroom? Use night lights. Install grab bars by the toilet and in the  tub and shower. Do not use towel bars as grab bars. Use non-skid mats or decals in the tub or shower. If you need to sit down in the shower, use a plastic, non-slip stool. Keep the floor dry. Clean up any water that spills on the floor as soon as it happens. Remove soap buildup in the tub or shower regularly. Attach bath mats securely with double-sided non-slip rug tape. Do not have throw rugs and other things on the floor that can make you trip. What can I do in the bedroom? Use night lights. Make sure that you have a light by your bed that is easy to reach. Do not use any sheets or blankets that are too big for your bed. They should not hang down onto the floor. Have a firm chair that has side arms. You can use this for support while you get dressed. Do not have throw rugs and other things on the floor that can make you trip. What can I do in the kitchen? Clean up any spills right away. Avoid  walking on wet floors. Keep items that you use a lot in easy-to-reach places. If you need to reach something above you, use a strong step stool that has a grab bar. Keep electrical cords out of the way. Do not use floor polish or wax that makes floors slippery. If you must use wax, use non-skid floor wax. Do not have throw rugs and other things on the floor that can make you trip. What can I do with my stairs? Do not leave any items on the stairs. Make sure that there are handrails on both sides of the stairs and use them. Fix handrails that are broken or loose. Make sure that handrails are as long as the stairways. Check any carpeting to make sure that it is firmly attached to the stairs. Fix any carpet that is loose or worn. Avoid having throw rugs at the top or bottom of the stairs. If you do have throw rugs, attach them to the floor with carpet tape. Make sure that you have a light switch at the top of the stairs and the bottom of the stairs. If you do not have them, ask someone to add them for  you. What else can I do to help prevent falls? Wear shoes that: Do not have high heels. Have rubber bottoms. Are comfortable and fit you well. Are closed at the toe. Do not wear sandals. If you use a stepladder: Make sure that it is fully opened. Do not climb a closed stepladder. Make sure that both sides of the stepladder are locked into place. Ask someone to hold it for you, if possible. Clearly mark and make sure that you can see: Any grab bars or handrails. First and last steps. Where the edge of each step is. Use tools that help you move around (mobility aids) if they are needed. These include: Canes. Walkers. Scooters. Crutches. Turn on the lights when you go into a dark area. Replace any light bulbs as soon as they burn out. Set up your furniture so you have a clear path. Avoid moving your furniture around. If any of your floors are uneven, fix them. If there are any pets around you, be aware of where they are. Review your medicines with your doctor. Some medicines can make you feel dizzy. This can increase your chance of falling. Ask your doctor what other things that you can do to help prevent falls. This information is not intended to replace advice given to you by your health care provider. Make sure you discuss any questions you have with your health care provider. Document Released: 04/16/2009 Document Revised: 11/26/2015 Document Reviewed: 07/25/2014 Elsevier Interactive Patient Education  2017 [employer].

## 2022-08-08 DIAGNOSIS — R197 Diarrhea, unspecified: Secondary | ICD-10-CM | POA: Diagnosis not present

## 2022-08-10 NOTE — Congregational Nurse Program (Signed)
CN office visit with interpreter Diu hartshorn assisting.  Patient was concerned about elevated electric bill.  [employer] intern Su Grand called [employer] who gave her phone number to call to have someone come to help assess possible cause of excess usage.  BP check 138/78.  States she saw PCP last week and goes back next week for a BP recheck.  Not currently on medication for BP.  Jake Michaelis RN, Congregational Nurse [phone]

## 2022-08-16 ENCOUNTER — Ambulatory Visit (INDEPENDENT_AMBULATORY_CARE_PROVIDER_SITE_OTHER): Payer: 59 | Admitting: Internal Medicine

## 2022-08-16 ENCOUNTER — Encounter: Payer: Self-pay | Admitting: Internal Medicine

## 2022-08-16 VITALS — BP 126/82 | HR 63 | Temp 98.1°F | Ht 59.0 in | Wt 116.6 lb

## 2022-08-16 DIAGNOSIS — E559 Vitamin D deficiency, unspecified: Secondary | ICD-10-CM | POA: Diagnosis not present

## 2022-08-16 DIAGNOSIS — J4540 Moderate persistent asthma, uncomplicated: Secondary | ICD-10-CM

## 2022-08-16 DIAGNOSIS — E7800 Pure hypercholesterolemia, unspecified: Secondary | ICD-10-CM | POA: Diagnosis not present

## 2022-08-16 DIAGNOSIS — K219 Gastro-esophageal reflux disease without esophagitis: Secondary | ICD-10-CM | POA: Diagnosis not present

## 2022-08-16 DIAGNOSIS — Z0000 Encounter for general adult medical examination without abnormal findings: Secondary | ICD-10-CM

## 2022-08-16 DIAGNOSIS — J4550 Severe persistent asthma, uncomplicated: Secondary | ICD-10-CM | POA: Insufficient documentation

## 2022-08-16 NOTE — Progress Notes (Signed)
I,Victoria T Hamilton,acting as a scribe for Maximino Greenland, MD.,have documented all relevant documentation on the behalf of Maximino Greenland, MD,as directed by  Maximino Greenland, MD while in the presence of Maximino Greenland, MD.   Subjective:     Patient ID: Samantha [name] , female    DOB: [birth year] , 82 y.o.   MRN: [id number]   Chief Complaint  Patient presents with   Annual Exam   Hyperlipidemia    HPI  She presents today for physical exam. Interpreter unavailable on site.  She reports compliance with medications. She denies having headaches, chest pain and shortness of breath.   Hyperlipidemia This is a chronic problem. The problem is controlled. There are no known factors aggravating her hyperlipidemia. Current antihyperlipidemic treatment includes statins. The current treatment provides moderate improvement of lipids. Risk factors for coronary artery disease include dyslipidemia, post-menopausal and a sedentary lifestyle.     Past Medical History:  Diagnosis Date   Asthma    GERD (gastroesophageal reflux disease)    H. pylori infection    Hypercholesteremia    UTI (lower urinary tract infection)      Family History  Problem Relation Age of Onset   Cancer Mother    Asthma Father    Allergic rhinitis Father    Food Allergy Father    Urticaria Neg Hx    Eczema Neg Hx    Angioedema Neg Hx      Current Outpatient Medications:    albuterol (VENTOLIN HFA) 108 (90 Base) MCG/ACT inhaler, Inhale 2 puffs into the lungs every 4 (four) hours as needed for wheezing or shortness of breath., Disp: 18 g, Rfl: 1   benzonatate (TESSALON PERLES) 100 MG capsule, Take 1 capsule (100 mg total) by mouth every 6 (six) hours as needed for cough., Disp: 30 capsule, Rfl: 0   EPINEPHrine 0.3 mg/0.3 mL IJ SOAJ injection, Inject 0.3 mg into the muscle as needed for anaphylaxis. Use as directed for severe allergic reaction, Disp: 2 each, Rfl: 1   pravastatin (PRAVACHOL) 20 MG tablet, TAKE 1 TABLET BY  MOUTH DAILY., Disp: 90 tablet, Rfl: 1   SYMBICORT 160-4.5 MCG/ACT inhaler, Inhale 2 puffs into the lungs 2 (two) times daily., Disp: 10.2 g, Rfl: 5   famotidine (PEPCID) 20 MG tablet, TAKE 1 TABLET(20 MG) BY MOUTH DAILY (Patient not taking: Reported on 08/16/2022), Disp: 90 tablet, Rfl: 1   Vitamin D, Ergocalciferol, (DRISDOL) 1.25 MG (50000 UNIT) CAPS capsule, TAKE ONE CAPSULE BY MOUTH TWICE WEEKLY ON TUESDAYS/FRIDAYS, Disp: 24 capsule, Rfl: 0  Current Facility-Administered Medications:    triamcinolone acetonide (KENALOG-40) injection 40 mg, 40 mg, Intramuscular, Once, Ghumman, Ramandeep, NP   Allergies  Allergen Reactions   Ciprofloxacin Other (See Comments)   Contrast Media [Iodinated Contrast Media] Other (See Comments)    Patient breaks out in hives pt has had ct scans in the past with premeds and has not had reaction.   Shellfish Allergy Other (See Comments)   Other Nausea And Vomiting, Rash and Other (See Comments)    Seafood allergy      The patient states she uses post menopausal status for birth control. Last LMP was No LMP recorded. Patient has had a hysterectomy.. Negative for Dysmenorrhea. Negative for: breast discharge, breast lump(s), breast pain and breast self exam. Associated symptoms include abnormal vaginal bleeding. Pertinent negatives include abnormal bleeding (hematology), anxiety, decreased libido, depression, difficulty falling sleep, dyspareunia, history of infertility, nocturia, sexual dysfunction, sleep disturbances, urinary incontinence, urinary  urgency, vaginal discharge and vaginal itching. Diet regular.The patient states her exercise level is  intermittent.  . The patient's tobacco use is:  Social History   Tobacco Use  Smoking Status Never  Smokeless Tobacco Never  . She has been exposed to passive smoke. The patient's alcohol use is:  Social History   Substance and Sexual Activity  Alcohol Use No   Alcohol/week: 0.0 standard drinks of alcohol     Review of Systems  Constitutional: Negative.   HENT: Negative.    Eyes: Negative.   Respiratory: Negative.    Cardiovascular: Negative.   Gastrointestinal: Negative.   Endocrine: Negative.   Genitourinary: Negative.   Musculoskeletal: Negative.   Skin: Negative.   Allergic/Immunologic: Negative.   Neurological: Negative.   Hematological: Negative.   Psychiatric/Behavioral: Negative.       Today's Vitals   08/16/22 0930  BP: 126/82  Pulse: 63  Temp: 98.1 F (36.7 C)  SpO2: 98%  Weight: 116 lb 9.6 oz (52.9 kg)  Height: '4\' 11"'$  (1.499 m)   Body mass index is 23.55 kg/m.  Wt Readings from Last 3 Encounters:  08/16/22 116 lb 9.6 oz (52.9 kg)  07/21/22 117 lb 3.2 oz (53.2 kg)  07/19/22 118 lb (53.5 kg)    Objective:  Physical Exam Vitals and nursing note reviewed.  Constitutional:      Appearance: Normal appearance.  HENT:     Head: Normocephalic and atraumatic.     Right Ear: Tympanic membrane, ear canal and external ear normal.     Left Ear: Tympanic membrane, ear canal and external ear normal.     Nose:     Comments: Masked     Mouth/Throat:     Comments: Masked  Eyes:     Extraocular Movements: Extraocular movements intact.     Conjunctiva/sclera: Conjunctivae normal.     Pupils: Pupils are equal, round, and reactive to light.  Cardiovascular:     Rate and Rhythm: Normal rate and regular rhythm.     Pulses: Normal pulses.     Heart sounds: Normal heart sounds.  Pulmonary:     Effort: Pulmonary effort is normal.     Breath sounds: Normal breath sounds.  Chest:  Breasts:    Tanner Score is 5.     Right: Normal.     Left: Normal.  Abdominal:     General: Abdomen is flat. Bowel sounds are normal.     Palpations: Abdomen is soft.  Genitourinary:    Comments: deferred Musculoskeletal:        General: Normal range of motion.     Cervical back: Normal range of motion and neck supple.  Skin:    General: Skin is warm and dry.  Neurological:      General: No focal deficit present.     Mental Status: She is alert and oriented to person, place, and time.  Psychiatric:        Mood and Affect: Mood normal.        Behavior: Behavior normal.         Assessment And Plan:     1. Encounter for annual health examination Comments: A full exam was performed. Importance of monthly self breast exams was discussed with the patient. Son, Charlotte Crumb, contacted by phone to discuss all concerns.  PATIENT IS ADVISED TO GET 30-45 MINUTES REGULAR EXERCISE NO LESS THAN FOUR TO FIVE DAYS PER WEEK - BOTH WEIGHTBEARING EXERCISES AND AEROBIC ARE RECOMMENDED.  PATIENT IS ADVISED TO FOLLOW A  HEALTHY DIET WITH AT LEAST SIX FRUITS/VEGGIES PER DAY, DECREASE INTAKE OF RED MEAT, AND TO INCREASE FISH INTAKE TO TWO DAYS PER WEEK.  MEATS/FISH SHOULD NOT BE FRIED, BAKED OR BROILED IS PREFERABLE.  IT IS ALSO IMPORTANT TO CUT BACK ON YOUR SUGAR INTAKE. PLEASE AVOID ANYTHING WITH ADDED SUGAR, CORN SYRUP OR OTHER SWEETENERS. IF YOU MUST USE A SWEETENER, YOU CAN TRY STEVIA. IT IS ALSO IMPORTANT TO AVOID ARTIFICIALLY SWEETENERS AND DIET BEVERAGES. LASTLY, I SUGGEST WEARING SPF 50 SUNSCREEN ON EXPOSED PARTS AND ESPECIALLY WHEN IN THE DIRECT SUNLIGHT FOR AN EXTENDED PERIOD OF TIME.  PLEASE AVOID FAST FOOD RESTAURANTS AND INCREASE YOUR WATER INTAKE.  2. Pure hypercholesterolemia Comments: Chronic, encouraged to take pravastatin as prescribed. Encouraged to follow heart healthy lifestyle. - CMP14+EGFR - Lipid panel - TSH  3. Moderate persistent asthma without complication Comments: Chronic, denies having any recent exacerbations. Encouraged to avoid known triggers and to use Symbicort DAILY. - CBC no Diff  4. Gastroesophageal reflux disease without esophagitis Comments: Chronic, for some reason she has stopped famotidine. She should resume daily dosing and advised to stop eating 3 hrs prior to lying down.  5. Vitamin D deficiency disease Comments: I will check vitamin D level and  supplement as needed. - Vitamin D (25 hydroxy)  Patient was given opportunity to ask questions. Patient verbalized understanding of the plan and was able to repeat key elements of the plan. All questions were answered to their satisfaction.   I, Maximino Greenland, MD, have reviewed all documentation for this visit. The documentation on 08/16/22 for the exam, diagnosis, procedures, and orders are all accurate and complete.   THE PATIENT IS ENCOURAGED TO PRACTICE SOCIAL DISTANCING DUE TO THE COVID-19 PANDEMIC.

## 2022-08-16 NOTE — Patient Instructions (Signed)

## 2022-08-17 LAB — CBC
Hematocrit: 35.7 % (ref 34.0–46.6)
Hemoglobin: 11.6 g/dL (ref 11.1–15.9)
MCH: 26.7 pg (ref 26.6–33.0)
MCHC: 32.5 g/dL (ref 31.5–35.7)
MCV: 82 fL (ref 79–97)
Platelets: 231 10*3/uL (ref 150–450)
RBC: 4.34 x10E6/uL (ref 3.77–5.28)
RDW: 14.1 % (ref 11.7–15.4)
WBC: 6.3 10*3/uL (ref 3.4–10.8)

## 2022-08-17 LAB — CMP14+EGFR
ALT: 18 IU/L (ref 0–32)
AST: 18 IU/L (ref 0–40)
Albumin/Globulin Ratio: 1.3 (ref 1.2–2.2)
Albumin: 4.2 g/dL (ref 3.7–4.7)
Alkaline Phosphatase: 64 IU/L (ref 44–121)
BUN/Creatinine Ratio: 17 (ref 12–28)
BUN: 8 mg/dL (ref 8–27)
Bilirubin Total: 0.3 mg/dL (ref 0.0–1.2)
CO2: 26 mmol/L (ref 20–29)
Calcium: 8.8 mg/dL (ref 8.7–10.3)
Chloride: 102 mmol/L (ref 96–106)
Creatinine, Ser: 0.47 mg/dL — ABNORMAL LOW (ref 0.57–1.00)
Globulin, Total: 3.2 g/dL (ref 1.5–4.5)
Glucose: 112 mg/dL — ABNORMAL HIGH (ref 70–99)
Potassium: 4.3 mmol/L (ref 3.5–5.2)
Sodium: 140 mmol/L (ref 134–144)
Total Protein: 7.4 g/dL (ref 6.0–8.5)
eGFR: 96 mL/min/{1.73_m2} (ref >59)

## 2022-08-17 LAB — TSH: TSH: 0.706 u[IU]/mL (ref 0.450–4.500)

## 2022-08-17 LAB — LIPID PANEL
Chol/HDL Ratio: 2.6 ratio (ref 0.0–4.4)
Cholesterol, Total: 174 mg/dL (ref 100–199)
HDL: 67 mg/dL (ref >39)
LDL Chol Calc (NIH): 89 mg/dL (ref 0–99)
Triglycerides: 100 mg/dL (ref 0–149)
VLDL Cholesterol Cal: 18 mg/dL (ref 5–40)

## 2022-08-17 LAB — VITAMIN D 25 HYDROXY (VIT D DEFICIENCY, FRACTURES): Vit D, 25-Hydroxy: 59.9 ng/mL (ref 30.0–100.0)

## 2022-08-25 ENCOUNTER — Other Ambulatory Visit: Payer: Self-pay | Admitting: Internal Medicine

## 2022-11-07 DIAGNOSIS — I872 Venous insufficiency (chronic) (peripheral): Secondary | ICD-10-CM | POA: Diagnosis not present

## 2022-11-07 DIAGNOSIS — L814 Other melanin hyperpigmentation: Secondary | ICD-10-CM | POA: Diagnosis not present

## 2022-11-24 ENCOUNTER — Ambulatory Visit: Payer: 59 | Admitting: Internal Medicine

## 2022-11-24 ENCOUNTER — Encounter: Payer: Self-pay | Admitting: Internal Medicine

## 2022-11-24 ENCOUNTER — Ambulatory Visit (INDEPENDENT_AMBULATORY_CARE_PROVIDER_SITE_OTHER): Payer: 59 | Admitting: Internal Medicine

## 2022-11-24 VITALS — BP 122/82 | HR 60 | Temp 97.4°F | Ht 59.0 in | Wt 116.8 lb

## 2022-11-24 DIAGNOSIS — Z79899 Other long term (current) drug therapy: Secondary | ICD-10-CM

## 2022-11-24 DIAGNOSIS — M79641 Pain in right hand: Secondary | ICD-10-CM

## 2022-11-24 DIAGNOSIS — M79661 Pain in right lower leg: Secondary | ICD-10-CM | POA: Diagnosis not present

## 2022-11-24 DIAGNOSIS — M5442 Lumbago with sciatica, left side: Secondary | ICD-10-CM | POA: Diagnosis not present

## 2022-11-24 MED ORDER — TIZANIDINE HCL 4 MG PO TABS: ORAL_TABLET | ORAL | 0 refills | Status: DC

## 2022-11-24 MED ORDER — TRIAMCINOLONE ACETONIDE 40 MG/ML IJ SUSP
40.0000 mg | Freq: Once | INTRAMUSCULAR | Status: AC
  Administered 2022-11-24: 40 mg via INTRAMUSCULAR

## 2022-11-24 NOTE — Patient Instructions (Signed)
Acute Back Pain, Adult Acute back pain is sudden and usually short-lived. It is often caused by an injury to the muscles and tissues in the back. The injury may result from: A muscle, tendon, or ligament getting overstretched or torn. Ligaments are tissues that connect bones to each other. Lifting something improperly can cause a back strain. Wear and tear (degeneration) of the spinal disks. Spinal disks are circular tissue that provide cushioning between the bones of the spine (vertebrae). Twisting motions, such as while playing sports or doing yard work. A hit to the back. Arthritis. You may have a physical exam, lab tests, and imaging tests to find the cause of your pain. Acute back pain usually goes away with rest and home care. Follow these instructions at home: Managing pain, stiffness, and swelling Take over-the-counter and prescription medicines only as told by your health care provider. Treatment may include medicines for pain and inflammation that are taken by mouth or applied to the skin, or muscle relaxants. Your health care provider may recommend applying ice during the first 24-48 hours after your pain starts. To do this: Put ice in a plastic bag. Place a towel between your skin and the bag. Leave the ice on for 20 minutes, 2-3 times a day. Remove the ice if your skin turns bright red. This is very important. If you cannot feel pain, heat, or cold, you have a greater risk of damage to the area. If directed, apply heat to the affected area as often as told by your health care provider. Use the heat source that your health care provider recommends, such as a moist heat pack or a heating pad. Place a towel between your skin and the heat source. Leave the heat on for 20-30 minutes. Remove the heat if your skin turns bright red. This is especially important if you are unable to feel pain, heat, or cold. You have a greater risk of getting burned. Activity  Do not stay in bed. Staying in  bed for more than 1-2 days can delay your recovery. Sit up and stand up straight. Avoid leaning forward when you sit or hunching over when you stand. If you work at a desk, sit close to it so you do not need to lean over. Keep your chin tucked in. Keep your neck drawn back, and keep your elbows bent at a 90-degree angle (right angle). Sit high and close to the steering wheel when you drive. Add lower back (lumbar) support to your car seat, if needed. Take short walks on even surfaces as soon as you are able. Try to increase the length of time you walk each day. Do not sit, drive, or stand in one place for more than 30 minutes at a time. Sitting or standing for long periods of time can put stress on your back. Do not drive or use heavy machinery while taking prescription pain medicine. Use proper lifting techniques. When you bend and lift, use positions that put less stress on your back: Groom your knees. Keep the load close to your body. Avoid twisting. Exercise regularly as told by your health care provider. Exercising helps your back heal faster and helps prevent back injuries by keeping muscles strong and flexible. Work with a physical therapist to make a safe exercise program, as recommended by your health care provider. Do any exercises as told by your physical therapist. Lifestyle Maintain a healthy weight. Extra weight puts stress on your back and makes it difficult to have good  posture. Avoid activities or situations that make you feel anxious or stressed. Stress and anxiety increase muscle tension and can make back pain worse. Learn ways to manage anxiety and stress, such as through exercise. General instructions Sleep on a firm mattress in a comfortable position. Try lying on your side with your knees slightly bent. If you lie on your back, put a pillow under your knees. Keep your head and neck in a straight line with your spine (neutral position) when using electronic equipment like  smartphones or pads. To do this: Raise your smartphone or pad to look at it instead of bending your head or neck to look down. Put the smartphone or pad at the level of your face while looking at the screen. Follow your treatment plan as told by your health care provider. This may include: Cognitive or behavioral therapy. Acupuncture or massage therapy. Meditation or yoga. Contact a health care provider if: You have pain that is not relieved with rest or medicine. You have increasing pain going down into your legs or buttocks. Your pain does not improve after 2 weeks. You have pain at night. You lose weight without trying. You have a fever or chills. You develop nausea or vomiting. You develop abdominal pain. Get help right away if: You develop new bowel or bladder control problems. You have unusual weakness or numbness in your arms or legs. You feel faint. These symptoms may represent a serious problem that is an emergency. Do not wait to see if the symptoms will go away. Get medical help right away. Call your local emergency services (911 in the [place] Do not drive yourself to the hospital. Summary Acute back pain is sudden and usually short-lived. Use proper lifting techniques. When you bend and lift, use positions that put less stress on your back. Take over-the-counter and prescription medicines only as told by your health care provider, and apply heat or ice as told. This information is not intended to replace advice given to you by your health care provider. Make sure you discuss any questions you have with your health care provider. Document Revised: 09/11/2020 Document Reviewed: 09/11/2020 Elsevier Patient Education  2024 [employer].

## 2022-11-24 NOTE — Progress Notes (Signed)
I,Victoria T Hamilton,acting as a scribe for [name], MD.,have documented all relevant documentation on the behalf of [name], MD,as directed by  [name], MD while in the presence of [name], MD.    Subjective:     Patient ID: Samantha [name] , female    DOB: [birth year] , 82 y.o.   MRN: [id number]   Chief Complaint  Patient presents with   Back Pain   Hand Pain    HPI  Patient presents today for evaluation of persistent llower back pain that radiates down the back of her right leg. She admits this has been an ongoing issue for a month. She denies fall/trauma. She is not sure what triggered her sx. She states the back & leg pain hurts the most when she tries to walk around her house or get up from bed. She denies having any LE paresthesias and/or weakness. Denies urinary/fecal incontinence. No fever/chills or urinary symptoms.   She also c/o right hand pain, she describes as an " needle poke". She denies having RUE weakness. Denies wrist pain.  At home she has used Tylenol, she reports this has not helped.      Past Medical History:  Diagnosis Date   Asthma    GERD (gastroesophageal reflux disease)    H. pylori infection    Hypercholesteremia    UTI (lower urinary tract infection)      Family History  Problem Relation Age of Onset   Cancer Mother    Asthma Father    Allergic rhinitis Father    Food Allergy Father    Urticaria Neg Hx    Eczema Neg Hx    Angioedema Neg Hx      Current Outpatient Medications:    acetaminophen (TYLENOL) 500 MG tablet, Take 500 mg by mouth every 6 (six) hours as needed., Disp: , Rfl:    albuterol (VENTOLIN HFA) 108 (90 Base) MCG/ACT inhaler, Inhale 2 puffs into the lungs every 4 (four) hours as needed for wheezing or shortness of breath., Disp: 18 g, Rfl: 1   EPINEPHrine 0.3 mg/0.3 mL IJ SOAJ injection, Inject 0.3 mg into the muscle as needed for anaphylaxis. Use as directed for severe allergic reaction, Disp: 2  each, Rfl: 1   famotidine (PEPCID) 20 MG tablet, TAKE 1 TABLET(20 MG) BY MOUTH DAILY, Disp: 90 tablet, Rfl: 1   pravastatin (PRAVACHOL) 20 MG tablet, TAKE 1 TABLET BY MOUTH DAILY., Disp: 90 tablet, Rfl: 1   SYMBICORT 160-4.5 MCG/ACT inhaler, Inhale 2 puffs into the lungs 2 (two) times daily., Disp: 10.2 g, Rfl: 5   Vitamin D, Ergocalciferol, (DRISDOL) 1.25 MG (50000 UNIT) CAPS capsule, TAKE ONE CAPSULE BY MOUTH TWICE WEEKLY ON TUESDAYS/FRIDAYS, Disp: 24 capsule, Rfl: 0   benzonatate (TESSALON PERLES) 100 MG capsule, Take 1 capsule (100 mg total) by mouth every 6 (six) hours as needed for cough. (Patient not taking: Reported on 11/24/2022), Disp: 30 capsule, Rfl: 0   lidocaine (LIDODERM) 5 %, Place 1 patch onto the skin daily. Remove & Discard patch within 12 hours or as directed by MD. Avoid having more than 1 patch on your skin at any given time., Disp: 14 patch, Rfl: 0   meloxicam (MOBIC) 7.5 MG tablet, Take 1 tablet (7.5 mg total) by mouth daily for 7 days., Disp: 7 tablet, Rfl: 0   methocarbamol (ROBAXIN) 500 MG tablet, Take 1 tablet (500 mg total) by mouth every 8 (eight) hours as needed for muscle spasms., Disp:  30 tablet, Rfl: 0   oxyCODONE-acetaminophen (PERCOCET/ROXICET) 5-325 MG tablet, Take 1 tablet by mouth every 8 (eight) hours as needed for severe pain., Disp: 12 tablet, Rfl: 0   predniSONE (STERAPRED UNI-PAK 21 TAB) 10 MG (21) TBPK tablet, Take by mouth daily. Take 6 tabs by mouth daily  for 2 days, then 5 tabs for 2 days, then 4 tabs for 2 days, then 3 tabs for 2 days, 2 tabs for 2 days, then 1 tab by mouth daily for 2 days, Disp: 42 tablet, Rfl: 0   Allergies  Allergen Reactions   Ciprofloxacin Other (See Comments)   Contrast Media [Iodinated Contrast Media] Other (See Comments)    Patient breaks out in hives pt has had ct scans in the past with premeds and has not had reaction.   Shellfish Allergy Other (See Comments)   Other Nausea And Vomiting, Rash and Other (See Comments)     Seafood allergy     Review of Systems  Constitutional: Negative.   Respiratory: Negative.    Cardiovascular: Negative.   Musculoskeletal:  Positive for arthralgias. Negative for neck pain.  Neurological: Negative.   Psychiatric/Behavioral: Negative.       Today's Vitals   11/24/22 1135  BP: 122/82  Pulse: 60  Temp: (!) 97.4 F (36.3 C)  SpO2: 98%  Weight: 116 lb 12.8 oz (53 kg)  Height: 4\' 11"  (1.499 m)   Wt Readings from Last 3 Encounters:  11/27/22 116 lb 13.5 oz (53 kg)  11/26/22 116 lb 13.5 oz (53 kg)  11/24/22 116 lb 12.8 oz (53 kg)    Body mass index is 23.59 kg/m.  The ASCVD Risk score (Arnett DK, et al., 2019) failed to calculate for the following reasons:   The 2019 ASCVD risk score is only valid for ages 59 to 64 ++ Objective:  Physical Exam Vitals and nursing note reviewed.  Constitutional:      Appearance: Normal appearance.  HENT:     Head: Normocephalic and atraumatic.  Eyes:     Extraocular Movements: Extraocular movements intact.  Cardiovascular:     Rate and Rhythm: Normal rate and regular rhythm.     Heart sounds: Normal heart sounds.  Pulmonary:     Effort: Pulmonary effort is normal.     Breath sounds: Normal breath sounds.  Musculoskeletal:        General: Swelling and tenderness present.     Cervical back: Normal range of motion.     Comments: Right calf tender to palpation.  Neg straight leg test  Skin:    General: Skin is warm.  Neurological:     General: No focal deficit present.     Mental Status: She is alert.  Psychiatric:        Mood and Affect: Mood normal.        Behavior: Behavior normal.         Assessment And Plan:     1. Acute midline low back pain with left-sided sciatica Comments: Acute on chronic symptoms. She was given Kenalog 40mg  IM x 1 and given rx tizanidine nightly prn. She is also advised to perform stretching exercises daily. - triamcinolone acetonide (KENALOG-40) injection 40 mg  2. Pain of right  hand Comments: Sx suggestive of paresthesias. Neg Tinel's and Phalen's signs. May benefit from wrist splint. ANA and ant-CCP neg July 2023.  3. Right calf pain Comments: I will refer for RLE u/s to r/o DVT.  She is in agreement with treatment plan.  Pt advised clinic will call her son, [name] w/ appt information. - VAS [place] LOWER EXTREMITY VENOUS (DVT); Future  4. Drug therapy - Vitamin B12    Return if symptoms worsen or fail to improve.  Patient was given opportunity to ask questions. Patient verbalized understanding of the plan and was able to repeat key elements of the plan. All questions were answered to their satisfaction.   I, [name], MD, have reviewed all documentation for this visit. The documentation on 11/30/22 for the exam, diagnosis, procedures, and orders are all accurate and complete.   IF YOU HAVE BEEN REFERRED TO A SPECIALIST, IT MAY TAKE 1-2 WEEKS TO SCHEDULE/PROCESS THE REFERRAL. IF YOU HAVE NOT HEARD FROM US/SPECIALIST IN TWO WEEKS, PLEASE GIVE [place] A CALL AT [phone].   THE PATIENT IS ENCOURAGED TO PRACTICE SOCIAL DISTANCING DUE TO THE COVID-19 PANDEMIC.

## 2022-11-26 ENCOUNTER — Other Ambulatory Visit: Payer: Self-pay

## 2022-11-26 ENCOUNTER — Ambulatory Visit (HOSPITAL_COMMUNITY)
Admission: EM | Admit: 2022-11-26 | Discharge: 2022-11-26 | Disposition: A | Discharge status: Home / Self Care | Payer: 59 | Attending: Emergency Medicine | Admitting: Emergency Medicine

## 2022-11-26 ENCOUNTER — Encounter (HOSPITAL_COMMUNITY): Payer: Self-pay | Admitting: Emergency Medicine

## 2022-11-26 DIAGNOSIS — M5416 Radiculopathy, lumbar region: Secondary | ICD-10-CM | POA: Diagnosis not present

## 2022-11-26 MED ORDER — KETOROLAC TROMETHAMINE 30 MG/ML IJ SOLN
30.0000 mg | Freq: Once | INTRAMUSCULAR | Status: AC
  Administered 2022-11-26: 30 mg via INTRAMUSCULAR

## 2022-11-26 MED ORDER — PREDNISONE 20 MG PO TABS: 40.0000 mg | ORAL_TABLET | Freq: Every day | ORAL | 0 refills | Status: DC

## 2022-11-26 MED ORDER — KETOROLAC TROMETHAMINE 30 MG/ML IJ SOLN
INTRAMUSCULAR | Status: AC
  Filled 2022-11-26: qty 1

## 2022-11-26 NOTE — Discharge Instructions (Signed)
The pain to your leg and hip is being caused by the pain to your back as it is compressing the nerve causing pain to move down the leg  You have been given an injection of Toradol today here in the office to help reduce inflammation which ideally would start to tone down some of your discomfort in about 30 minutes to an hour  Starting tomorrow take prednisone every morning with food for 5 days to continue the process above, may take Tylenol in addition to this throughout the day as needed  You may continue use of tenacity which is the medicine prescribed by your primary doctor to be used at bedtime for comfort  You may use heating pad in 15 minute intervals as needed for additional comfort,  you may find comfort in using ice in 10-15 minutes over affected area  Begin stretching affected area daily for 10 minutes as tolerated to further loosen muscles   When lying down place pillow underneath back and underneath the leg   Practice good posture: head back, shoulders back, chest forward, pelvis back and weight distributed evenly on both legs  If pain persist after recommended treatment or reoccurs if may be beneficial to follow up with orthopedic specialist for evaluation, this doctor specializes in the bones and can manage your symptoms long-term with options such as but not limited to imaging, medications or physical therapy

## 2022-11-26 NOTE — ED Triage Notes (Signed)
Pt states she is been having left hip radiating down to her left leg for 2 weeks, denies any injury, states she was seen by PCP, but only help her with her back pain and not the leg pain.

## 2022-11-26 NOTE — ED Provider Notes (Signed)
[facility]    CSN: [id number] Arrival date & time: 11/26/22  1003      History   Chief Complaint Chief Complaint  Patient presents with   Leg Pain    Pt states she is been having left hip radiating down to her left leg for 2 weeks, denies any injury, states she was seen by PCP, but only help her with her back pain and not the leg pain.    HPI Samantha [name] is a 82 y.o. female.   Patient presents for evaluation of constant left lower extremity and left hip pain present for 2 weeks without injury or trauma.  Has had accompanying left lower back pain that began at the same time.  Pain can be felt with all movement such as sitting walking and lying.  Denies numbness or tingling.  Has attempted use of Asian topical ointment this similar to [place].  Was evaluated by her PCP 2 days ago for back pain was prescribed Zanaflex, somewhat helpful but does not resolve symptoms.  Denies urinary or bowel incontinence.    Translation completed by family   Past Medical History:  Diagnosis Date   Asthma    GERD (gastroesophageal reflux disease)    H. pylori infection    Hypercholesteremia    UTI (lower urinary tract infection)     Patient Active Problem List   Diagnosis Date Noted   Asthma, severe persistent, well-controlled 08/16/2022   Paresthesia of foot, bilateral 06/24/2021   Chronic pain of left knee 05/11/2021   Pure hypercholesterolemia 05/11/2021   Vitamin D deficiency disease 05/11/2021   Primary osteoarthritis of left knee 09/13/2020   Lumbar radiculopathy, chronic 09/13/2020   Abnormal ultrasound of abdomen 09/13/2020   S/P laparoscopic cholecystectomy 05/21/2018   Chronic venous insufficiency 05/01/2015   Varicose veins of lower extremities with complications 05/01/2015   Periumbilical abdominal pain 04/13/2015   Colitis 12/19/2012   Dyslipidemia 12/19/2012   Asthma 02/15/2008   SHORTNESS OF BREATH (SOB) 02/15/2008   CHEST PAIN-UNSPECIFIED 02/15/2008    Past  Surgical History:  Procedure Laterality Date   ABDOMINAL HYSTERECTOMY     ABDOMINAL SURGERY     CESAREAN SECTION     CHOLECYSTECTOMY N/A 05/21/2018   Procedure: LAPAROSCOPIC CHOLECYSTECTOMY WITH ATTEMPTED INTRAOPERATIVE CHOLANGIOGRAM;  Surgeon: Luretha Murphy, MD;  Location: [facility];  Service: General;  Laterality: N/A;    OB History   No obstetric history on file.      Home Medications    Prior to Admission medications   Medication Sig Start Date End Date Taking? Authorizing Provider  acetaminophen (TYLENOL) 500 MG tablet Take 500 mg by mouth every 6 (six) hours as needed.    [provider]  albuterol (VENTOLIN HFA) 108 (90 Base) MCG/ACT inhaler Inhale 2 puffs into the lungs every 4 (four) hours as needed for wheezing or shortness of breath. 06/20/22   Nehemiah Settle, FNP  benzonatate (TESSALON PERLES) 100 MG capsule Take 1 capsule (100 mg total) by mouth every 6 (six) hours as needed for cough. Patient not taking: Reported on 11/24/2022 07/19/22 07/19/23  Dorothyann Peng, MD  EPINEPHrine 0.3 mg/0.3 mL IJ SOAJ injection Inject 0.3 mg into the muscle as needed for anaphylaxis. Use as directed for severe allergic reaction 12/28/21   Kozlow, Alvira Philips, MD  famotidine (PEPCID) 20 MG tablet TAKE 1 TABLET(20 MG) BY MOUTH DAILY 06/08/22   Dorothyann Peng, MD  pravastatin (PRAVACHOL) 20 MG tablet TAKE 1 TABLET BY MOUTH DAILY. 02/21/22   Allyne Gee,  Melina Schools, MD  SYMBICORT 160-4.5 MCG/ACT inhaler Inhale 2 puffs into the lungs 2 (two) times daily. 06/20/22   Nehemiah Settle, FNP  tiZANidine (ZANAFLEX) 4 MG tablet One tab po at bedtime as needed for back pain 11/24/22   Dorothyann Peng, MD  Vitamin D, Ergocalciferol, (DRISDOL) 1.25 MG (50000 UNIT) CAPS capsule TAKE ONE CAPSULE BY MOUTH TWICE WEEKLY ON TUESDAYS/FRIDAYS 08/25/22   Dorothyann Peng, MD    Family History Family History  Problem Relation Age of Onset   Cancer Mother    Asthma Father    Allergic rhinitis Father    Food Allergy Father     Urticaria Neg Hx    Eczema Neg Hx    Angioedema Neg Hx     Social History Social History   Tobacco Use   Smoking status: Never   Smokeless tobacco: Never  Vaping Use   Vaping Use: Never used  Substance Use Topics   Alcohol use: No    Alcohol/week: 0.0 standard drinks of alcohol   Drug use: No     Allergies   Ciprofloxacin, Contrast media [iodinated contrast media], Shellfish allergy, and Other   Review of Systems Review of Systems   Physical Exam Triage Vital Signs ED Triage Vitals  Enc Vitals Group     BP 11/26/22 1021 (!) 157/69     Pulse Rate 11/26/22 1021 61     Resp 11/26/22 1021 18     Temp 11/26/22 1021 98 F (36.7 C)     Temp Source 11/26/22 1021 Oral     SpO2 11/26/22 1021 98 %     Weight 11/26/22 1022 116 lb 13.5 oz (53 kg)     Height 11/26/22 1022 4\' 11"  (1.499 m)     Head Circumference --      Peak Flow --      Pain Score 11/26/22 1021 8     Pain Loc --      Pain Edu? --      Excl. in GC? --    No data found.  Updated Vital Signs BP (!) 157/69 (BP Location: Right Arm)   Pulse 61   Temp 98 F (36.7 C) (Oral)   Resp 18   Ht 4\' 11"  (1.499 m)   Wt 116 lb 13.5 oz (53 kg)   SpO2 98%   BMI 23.60 kg/m   Visual Acuity Right Eye Distance:   Left Eye Distance:   Bilateral Distance:    Right Eye Near:   Left Eye Near:    Bilateral Near:     Physical Exam Constitutional:      Appearance: Normal appearance.  Eyes:     Extraocular Movements: Extraocular movements intact.  Musculoskeletal:     Comments: Tenderness is present to the left latissimus dorsi without ecchymosis swelling or deformity, extending into the left buttocks and posterior and lateral left thigh, able to bear weight, 2+ femoral pulses, has full range of motion of the left hip able to twist turn and bend the back  Neurological:     Mental Status: She is alert and oriented to person, place, and time. Mental status is at baseline.      UC Treatments / Results  Labs (all  labs ordered are listed, but only abnormal results are displayed) Labs Reviewed - No data to display  EKG   Radiology No results found.  Procedures Procedures (including critical care time)  Medications Ordered in UC Medications - No data to display  Initial Impression / Assessment  and Plan / UC Course  I have reviewed the triage vital signs and the nursing notes.  Pertinent labs & imaging results that were available during my care of the patient were reviewed by me and considered in my medical decision making (see chart for details).  Lumbar back pain with radiculopathy affecting the left lower extremity  Etiology is most likely muscular with nerve compression, discussed with patient and family, Toradol injection given in office and prescribed prednisone burst for outpatient with continued use of tenacity, discussed supportive measures through RICE heat massage stretching and activity as tolerated, advise follow-up with PCP if symptoms persist or worsen Final Clinical Impressions(s) / UC Diagnoses   Final diagnoses:  None   Discharge Instructions   None    ED Prescriptions   None    PDMP not reviewed this encounter.   Valinda Hoar, NP 11/26/22 1048

## 2022-11-27 ENCOUNTER — Encounter (HOSPITAL_COMMUNITY): Payer: Self-pay

## 2022-11-27 ENCOUNTER — Emergency Department (HOSPITAL_COMMUNITY): Payer: 59

## 2022-11-27 ENCOUNTER — Emergency Department (HOSPITAL_COMMUNITY)
Admission: EM | Admit: 2022-11-27 | Discharge: 2022-11-27 | Disposition: A | Discharge status: Home / Self Care | Payer: 59 | Attending: Emergency Medicine | Admitting: Emergency Medicine

## 2022-11-27 DIAGNOSIS — M545 Low back pain, unspecified: Secondary | ICD-10-CM | POA: Diagnosis not present

## 2022-11-27 DIAGNOSIS — M8588 Other specified disorders of bone density and structure, other site: Secondary | ICD-10-CM | POA: Diagnosis not present

## 2022-11-27 DIAGNOSIS — J45909 Unspecified asthma, uncomplicated: Secondary | ICD-10-CM | POA: Insufficient documentation

## 2022-11-27 DIAGNOSIS — M79605 Pain in left leg: Secondary | ICD-10-CM | POA: Diagnosis not present

## 2022-11-27 DIAGNOSIS — M5136 Other intervertebral disc degeneration, lumbar region: Secondary | ICD-10-CM | POA: Diagnosis not present

## 2022-11-27 DIAGNOSIS — M5442 Lumbago with sciatica, left side: Secondary | ICD-10-CM | POA: Diagnosis not present

## 2022-11-27 DIAGNOSIS — M47816 Spondylosis without myelopathy or radiculopathy, lumbar region: Secondary | ICD-10-CM | POA: Diagnosis not present

## 2022-11-27 DIAGNOSIS — Z7951 Long term (current) use of inhaled steroids: Secondary | ICD-10-CM | POA: Insufficient documentation

## 2022-11-27 DIAGNOSIS — R531 Weakness: Secondary | ICD-10-CM | POA: Diagnosis not present

## 2022-11-27 DIAGNOSIS — M5126 Other intervertebral disc displacement, lumbar region: Secondary | ICD-10-CM | POA: Diagnosis not present

## 2022-11-27 DIAGNOSIS — M25552 Pain in left hip: Secondary | ICD-10-CM | POA: Diagnosis not present

## 2022-11-27 LAB — CBC WITH DIFFERENTIAL/PLATELET
Abs Immature Granulocytes: 0.01 10*3/uL (ref 0.00–0.07)
Basophils Absolute: 0.1 10*3/uL (ref 0.0–0.1)
Basophils Relative: 1 %
Eosinophils Absolute: 0.3 10*3/uL (ref 0.0–0.5)
Eosinophils Relative: 4 %
HCT: 38.2 % (ref 36.0–46.0)
Hemoglobin: 11.9 g/dL — ABNORMAL LOW (ref 12.0–15.0)
Immature Granulocytes: 0 %
Lymphocytes Relative: 35 %
Lymphs Abs: 2.3 10*3/uL (ref 0.7–4.0)
MCH: 26 pg (ref 26.0–34.0)
MCHC: 31.2 g/dL (ref 30.0–36.0)
MCV: 83.4 fL (ref 80.0–100.0)
Monocytes Absolute: 0.3 10*3/uL (ref 0.1–1.0)
Monocytes Relative: 4 %
Neutro Abs: 3.6 10*3/uL (ref 1.7–7.7)
Neutrophils Relative %: 56 %
Platelets: 242 10*3/uL (ref 150–400)
RBC: 4.58 MIL/uL (ref 3.87–5.11)
RDW: 13.7 % (ref 11.5–15.5)
WBC: 6.5 10*3/uL (ref 4.0–10.5)
nRBC: 0 % (ref 0.0–0.2)

## 2022-11-27 LAB — COMPREHENSIVE METABOLIC PANEL
ALT: 16 U/L (ref 0–44)
AST: 24 U/L (ref 15–41)
Albumin: 3.7 g/dL (ref 3.5–5.0)
Alkaline Phosphatase: 52 U/L (ref 38–126)
Anion gap: 12 (ref 5–15)
BUN: 15 mg/dL (ref 8–23)
CO2: 24 mmol/L (ref 22–32)
Calcium: 8.4 mg/dL — ABNORMAL LOW (ref 8.9–10.3)
Chloride: 100 mmol/L (ref 98–111)
Creatinine, Ser: 0.55 mg/dL (ref 0.44–1.00)
GFR, Estimated: >60 mL/min (ref >60)
Glucose, Bld: 97 mg/dL (ref 70–99)
Potassium: 3.8 mmol/L (ref 3.5–5.1)
Sodium: 136 mmol/L (ref 135–145)
Total Bilirubin: 0.6 mg/dL (ref 0.3–1.2)
Total Protein: 7.7 g/dL (ref 6.5–8.1)

## 2022-11-27 LAB — CK: Total CK: 47 U/L (ref 38–234)

## 2022-11-27 LAB — MAGNESIUM: Magnesium: 2.2 mg/dL (ref 1.7–2.4)

## 2022-11-27 MED ORDER — OXYCODONE-ACETAMINOPHEN 5-325 MG PO TABS
1.0000 | ORAL_TABLET | Freq: Once | ORAL | Status: AC
  Administered 2022-11-27: 1 via ORAL
  Filled 2022-11-27: qty 1

## 2022-11-27 MED ORDER — METHOCARBAMOL 500 MG PO TABS: 500.0000 mg | ORAL_TABLET | Freq: Three times a day (TID) | ORAL | 0 refills | Status: DC | PRN

## 2022-11-27 MED ORDER — KETOROLAC TROMETHAMINE 15 MG/ML IJ SOLN
15.0000 mg | Freq: Once | INTRAMUSCULAR | Status: AC
  Administered 2022-11-27: 15 mg via INTRAVENOUS
  Filled 2022-11-27: qty 1

## 2022-11-27 MED ORDER — METHOCARBAMOL 500 MG PO TABS
500.0000 mg | ORAL_TABLET | Freq: Once | ORAL | Status: AC
  Administered 2022-11-27: 500 mg via ORAL
  Filled 2022-11-27: qty 1

## 2022-11-27 MED ORDER — OXYCODONE-ACETAMINOPHEN 5-325 MG PO TABS: 1.0000 | ORAL_TABLET | Freq: Three times a day (TID) | ORAL | 0 refills | Status: DC | PRN

## 2022-11-27 MED ORDER — LIDOCAINE 5 % EX PTCH: 1.0000 | MEDICATED_PATCH | CUTANEOUS | 0 refills | Status: AC

## 2022-11-27 MED ORDER — GADOBUTROL 1 MMOL/ML IV SOLN
5.0000 mL | Freq: Once | INTRAVENOUS | Status: AC | PRN
  Administered 2022-11-27: 5 mL via INTRAVENOUS

## 2022-11-27 MED ORDER — LACTATED RINGERS IV BOLUS
500.0000 mL | Freq: Once | INTRAVENOUS | Status: AC
  Administered 2022-11-27: 500 mL via INTRAVENOUS

## 2022-11-27 MED ORDER — PREDNISONE 10 MG (21) PO TBPK: ORAL_TABLET | Freq: Every day | ORAL | 0 refills | Status: DC

## 2022-11-27 MED ORDER — LIDOCAINE 5 % EX PTCH
1.0000 | MEDICATED_PATCH | CUTANEOUS | Status: DC
  Administered 2022-11-27: 1 via TRANSDERMAL
  Filled 2022-11-27: qty 1

## 2022-11-27 MED ORDER — MELOXICAM 7.5 MG PO TABS: 7.5000 mg | ORAL_TABLET | Freq: Every day | ORAL | 0 refills | Status: AC

## 2022-11-27 NOTE — ED Notes (Signed)
Pt states she went to urgent care yesterday and received a shot in her back that didn't give any relief. Pt states left leg was hurting prior to going to UC.

## 2022-11-27 NOTE — Discharge Instructions (Addendum)
You have sciatica.  This is a painful condition caused by inflammation of the nerve running down the back of your leg.  Treatment of this is pain control and steroids.  There were prescriptions that were sent to your pharmacy.  Discontinue use of your tizanidine as well as the prednisone that you were prescribed yesterday.  New prescriptions are as follows:  -Prednisone is a steroid that limits inflammation.  This new prescription replaces the one that you received yesterday.  -Robaxin is a muscle relaxer.  It is less sedating than the tizanidine.  Discontinue use of the tizanidine and use Robaxin as needed for pain and muscle spasms. -Lidocaine patches can be placed on the lower back, in the area of your pain.  These will last for a day.  Avoid having more than 1 patch on your skin at any given time. -Meloxicam is a nonsteroidal anti-inflammatory pain medication.  Take this daily.  Do not use in combination with over-the-counter NSAIDs. -Percocet is a narcotic pain medication.  Take only as needed for pain.  Do not take in combination with muscle relaxers.  There is a telephone number below that you can call for a follow-up appointment with a spine doctor.  You should also follow-up with your PCP.  Return to the emergency department for any new or worsening symptoms of concern.

## 2022-11-27 NOTE — ED Notes (Signed)
Pt transported to MRI 

## 2022-11-27 NOTE — ED Provider Notes (Signed)
[facility] Provider Note   CSN: [id number] Arrival date & time: 11/27/22  4098     History  Chief Complaint  Patient presents with   Leg Pain    Samantha [name] is a 82 y.o. female.   Leg Pain Associated symptoms: back pain   Patient presents for low back and left hip pain.  Medical history includes asthma, GERD, HLD.  She was seen in urgent care yesterday for pain and left hip, radiating down left lower extremity.  Pain has been progressive for the past 2 weeks.  There was no associated injury or trauma.  Yesterday, she was given Toradol and prescribed prednisone burst.  Today, she presents the ED due to worsening symptoms.  She has not taken anything for pain today.  Current pain is severe.  It is worse with ROM of left hip.  It radiates down left leg to the knee, worse in the posterior aspect.     Home Medications Prior to Admission medications   Medication Sig Start Date End Date Taking? Authorizing Provider  lidocaine (LIDODERM) 5 % Place 1 patch onto the skin daily. Remove & Discard patch within 12 hours or as directed by MD. Avoid having more than 1 patch on your skin at any given time. 11/27/22  Yes Gloris Manchester, MD  meloxicam (MOBIC) 7.5 MG tablet Take 1 tablet (7.5 mg total) by mouth daily for 7 days. 11/27/22 12/04/22 Yes Gloris Manchester, MD  methocarbamol (ROBAXIN) 500 MG tablet Take 1 tablet (500 mg total) by mouth every 8 (eight) hours as needed for muscle spasms. 11/27/22  Yes Gloris Manchester, MD  oxyCODONE-acetaminophen (PERCOCET/ROXICET) 5-325 MG tablet Take 1 tablet by mouth every 8 (eight) hours as needed for severe pain. 11/27/22  Yes Gloris Manchester, MD  predniSONE (STERAPRED UNI-PAK 21 TAB) 10 MG (21) TBPK tablet Take by mouth daily. Take 6 tabs by mouth daily  for 2 days, then 5 tabs for 2 days, then 4 tabs for 2 days, then 3 tabs for 2 days, 2 tabs for 2 days, then 1 tab by mouth daily for 2 days 11/27/22  Yes Gloris Manchester, MD  acetaminophen  (TYLENOL) 500 MG tablet Take 500 mg by mouth every 6 (six) hours as needed.    [provider]  albuterol (VENTOLIN HFA) 108 (90 Base) MCG/ACT inhaler Inhale 2 puffs into the lungs every 4 (four) hours as needed for wheezing or shortness of breath. 06/20/22   Nehemiah Settle, FNP  benzonatate (TESSALON PERLES) 100 MG capsule Take 1 capsule (100 mg total) by mouth every 6 (six) hours as needed for cough. Patient not taking: Reported on 11/24/2022 07/19/22 07/19/23  Dorothyann Peng, MD  EPINEPHrine 0.3 mg/0.3 mL IJ SOAJ injection Inject 0.3 mg into the muscle as needed for anaphylaxis. Use as directed for severe allergic reaction 12/28/21   Kozlow, Alvira Philips, MD  famotidine (PEPCID) 20 MG tablet TAKE 1 TABLET(20 MG) BY MOUTH DAILY 06/08/22   Dorothyann Peng, MD  pravastatin (PRAVACHOL) 20 MG tablet TAKE 1 TABLET BY MOUTH DAILY. 02/21/22   Dorothyann Peng, MD  SYMBICORT 160-4.5 MCG/ACT inhaler Inhale 2 puffs into the lungs 2 (two) times daily. 06/20/22   Nehemiah Settle, FNP  Vitamin D, Ergocalciferol, (DRISDOL) 1.25 MG (50000 UNIT) CAPS capsule TAKE ONE CAPSULE BY MOUTH TWICE WEEKLY ON TUESDAYS/FRIDAYS 08/25/22   Dorothyann Peng, MD      Allergies    Ciprofloxacin, Contrast media [iodinated contrast media], Shellfish allergy, and Other  Review of Systems   Review of Systems  Musculoskeletal:  Positive for arthralgias and back pain.  All other systems reviewed and are negative.   Physical Exam Updated Vital Signs BP (!) 125/92   Pulse (!) 52   Temp 97.8 F (36.6 C) (Oral)   Resp 14   Ht 4\' 11"  (1.499 m)   Wt 53 kg   SpO2 99%   BMI 23.60 kg/m  Physical Exam Vitals and nursing note reviewed.  Constitutional:      General: She is not in acute distress.    Appearance: Normal appearance. She is well-developed. She is not ill-appearing, toxic-appearing or diaphoretic.  HENT:     Head: Normocephalic and atraumatic.     Right Ear: External ear normal.     Left Ear: External ear normal.      Nose: Nose normal.     Mouth/Throat:     Mouth: Mucous membranes are moist.  Eyes:     Extraocular Movements: Extraocular movements intact.     Conjunctiva/sclera: Conjunctivae normal.  Cardiovascular:     Rate and Rhythm: Normal rate and regular rhythm.  Pulmonary:     Effort: Pulmonary effort is normal. No respiratory distress.  Abdominal:     General: There is no distension.     Palpations: Abdomen is soft.  Musculoskeletal:        General: No swelling or deformity. Normal range of motion.     Cervical back: Normal range of motion and neck supple.     Right lower leg: No edema.     Left lower leg: No edema.  Skin:    General: Skin is warm and dry.     Coloration: Skin is not jaundiced or pale.  Neurological:     General: No focal deficit present.     Mental Status: She is alert and oriented to person, place, and time.     Sensory: No sensory deficit.     Motor: No weakness.  Psychiatric:        Mood and Affect: Mood normal. Affect is tearful.        Speech: Speech normal.        Behavior: Behavior normal. Behavior is cooperative.     ED Results / Procedures / Treatments   Labs (all labs ordered are listed, but only abnormal results are displayed) Labs Reviewed  CBC WITH DIFFERENTIAL/PLATELET - Abnormal; Notable for the following components:      Result Value   Hemoglobin 11.9 (*)    All other components within normal limits  COMPREHENSIVE METABOLIC PANEL - Abnormal; Notable for the following components:   Calcium 8.4 (*)    All other components within normal limits  MAGNESIUM  CK    EKG None  Radiology MR Lumbar Spine W Wo Contrast  Result Date: 11/27/2022 CLINICAL DATA:  Low back pain. Cauda equina syndrome suspected. Left leg pain. EXAM: MRI LUMBAR SPINE WITHOUT AND WITH CONTRAST TECHNIQUE: Multiplanar and multiecho pulse sequences of the lumbar spine were obtained without and with intravenous contrast. CONTRAST:  5mL GADAVIST GADOBUTROL 1 MMOL/ML IV SOLN  COMPARISON:  CT examination dated Nov 27, 2018 FINDINGS: Segmentation:   5 non rib-bearing lumbar type vertebral bodies are present. The lowest fully formed vertebral body is L5. Alignment:  Physiologic. Vertebrae:  No fracture, evidence of discitis, or bone lesion. Conus medullaris and cauda equina: Conus extends to the L1 level. Conus and cauda equina appear normal. Paraspinal and other soft tissues: Negative. Disc levels: T12-L1: No  significant disc bulge. No neural foraminal stenosis. No central canal stenosis. L1-L2: No significant disc bulge. No neural foraminal stenosis. No central canal stenosis. L2-L3: No significant disc bulge. No neural foraminal stenosis. No central canal stenosis. L3-L4: Moderate paracentral disc bulge with moderate lateral recess stenosis bilaterally. Ligamentum flavum hypertrophy with spinal canal stenosis. Mild facet joint arthropathy. Mild bilateral neural foraminal stenosis. L4-L5: Moderate paracentral disc bulge with moderate lateral recess stenosis bilaterally. Ligamentum flavum hypertrophy with spinal canal stenosis. Mild bilateral neural foraminal stenosis. L5-S1: Disc height loss and disc extrusion with moderate bilateral lateral recess stenosis. No significant neural foraminal stenosis. Ligamentum flavum hypertrophy with moderate bilateral facet joint arthropathy. IMPRESSION: 1. No evidence of acute fracture or subluxation. 2. Moderate degenerative disc disease at L3-L4, L4-L5 and L5-S1 with moderate lateral recess stenosis and spinal canal stenosis. Mild bilateral neural foraminal stenosis at L3-L4 and L4-L5. Electronically Signed   By: Larose Hires D.O.   On: 11/27/2022 14:58   CT Lumbar Spine Wo Contrast  Result Date: 11/27/2022 CLINICAL DATA:  Left leg pain and weakness for the past 3 days. No injury. EXAM: CT LUMBAR SPINE WITHOUT CONTRAST TECHNIQUE: Multidetector CT imaging of the lumbar spine was performed without intravenous contrast administration. Multiplanar CT  image reconstructions were also generated. RADIATION DOSE REDUCTION: This exam was performed according to the departmental dose-optimization program which includes automated exposure control, adjustment of the mA and/or kV according to patient size and/or use of iterative reconstruction technique. COMPARISON:  Lumbar spine x-rays dated September 24, 2020. CT abdomen pelvis dated Nov 14, 2017. FINDINGS: Segmentation: 5 lumbar type vertebrae. Alignment: Normal. Vertebrae: No acute fracture or focal pathologic process. Osteopenia. Paraspinal and other soft tissues: Aortoiliac atherosclerotic vascular disease. Disc levels: Mild disc bulging and bilateral facet arthropathy from L2-L3 through L5-S1. Superimposed small left subarticular disc protrusion at L5-S1 could affect the descending left S1 nerve root (series 5, image 94; series 9, image 40). No high-grade spinal canal or neuroforaminal stenosis. IMPRESSION: 1. No acute osseous abnormality. 2. Mild multilevel lumbar spondylosis. Small left subarticular disc protrusion at L5-S1 could affect the descending left S1 nerve root. 3.  Aortic Atherosclerosis (ICD10-I70.0). Electronically Signed   By: Obie Dredge M.D.   On: 11/27/2022 11:07   CT Hip Left Wo Contrast  Result Date: 11/27/2022 CLINICAL DATA:  Left leg pain and weakness for the past 3 days. No injury. EXAM: CT OF THE LEFT HIP WITHOUT CONTRAST TECHNIQUE: Multidetector CT imaging of the left hip was performed according to the standard protocol. Multiplanar CT image reconstructions were also generated. RADIATION DOSE REDUCTION: This exam was performed according to the departmental dose-optimization program which includes automated exposure control, adjustment of the mA and/or kV according to patient size and/or use of iterative reconstruction technique. COMPARISON:  Left hip x-rays from same day. CT abdomen pelvis dated Nov 14, 2017. FINDINGS: Bones/Joint/Cartilage No fracture or dislocation. Joint spaces are  preserved. No joint effusion. Ligaments Ligaments are suboptimally evaluated by CT. Muscles and Tendons Grossly intact. Soft tissue No fluid collection or hematoma.  No soft tissue mass. IMPRESSION: 1. No acute osseous abnormality. Electronically Signed   By: Obie Dredge M.D.   On: 11/27/2022 11:02   DG Hip Unilat W or Wo Pelvis 2-3 Views Left  Result Date: 11/27/2022 CLINICAL DATA:  Left hip pain EXAM: DG HIP (WITH OR WITHOUT PELVIS) 2-3V LEFT COMPARISON:  CT 07/17/2014 FINDINGS: There is no evidence of hip fracture or dislocation. There is no evidence of arthropathy or other  focal bone abnormality. IMPRESSION: Negative. Electronically Signed   By: Signa Kell M.D.   On: 11/27/2022 09:21    Procedures Procedures    Medications Ordered in ED Medications  lidocaine (LIDODERM) 5 % 1 patch (1 patch Transdermal Patch Applied 11/27/22 0756)  methocarbamol (ROBAXIN) tablet 500 mg (500 mg Oral Given 11/27/22 0756)  lactated ringers bolus 500 mL (0 mLs Intravenous Stopped 11/27/22 0857)  ketorolac (TORADOL) 15 MG/ML injection 15 mg (15 mg Intravenous Given 11/27/22 0756)  oxyCODONE-acetaminophen (PERCOCET/ROXICET) 5-325 MG per tablet 1 tablet (1 tablet Oral Given 11/27/22 0756)  gadobutrol (GADAVIST) 1 MMOL/ML injection 5 mL (5 mLs Intravenous Contrast Given 11/27/22 1400)  oxyCODONE-acetaminophen (PERCOCET/ROXICET) 5-325 MG per tablet 1 tablet (1 tablet Oral Given 11/27/22 1534)    ED Course/ Medical Decision Making/ A&P                             Medical Decision Making Amount and/or Complexity of Data Reviewed Labs: ordered. Radiology: ordered.  Risk Prescription drug management.   This patient presents to the ED for concern of left leg pain, this involves an extensive number of treatment options, and is a complaint that carries with it a high risk of complications and morbidity.  The differential diagnosis includes herniated disc, sciatica, arthritis, neoplasm, occult fracture, cauda  equina   Co morbidities that complicate the patient evaluation  asthma, GERD, HLD   Additional history obtained:  Additional history obtained from N/A External records from outside source obtained and reviewed including EMR   Lab Tests:  I Ordered, and personally interpreted labs.  The pertinent results include: Baseline hemoglobin, no leukocytosis, normal electrolytes, normal CK   Imaging Studies ordered:  I ordered imaging studies including left hip x-ray, CT of lumbar spine and left hip, MRI of lumbar spine I independently visualized and interpreted imaging which showed moderate degenerative disease with moderate lateral recess stenosis and spinal canal stenosis I agree with the radiologist interpretation   Cardiac Monitoring: / EKG:  The patient was maintained on a cardiac monitor.  I personally viewed and interpreted the cardiac monitored which showed an underlying rhythm of: Sinus rhythm   Problem List / ED Course / Critical interventions / Medication management  Patient presents for left leg pain.  Areas of pain are posterior left hip and radiating down posterior left proximal leg.  She was seen in urgent care for similar symptoms recently.  She was prescribed a burst dose of steroid.  She arrives in the ED with her medications.  She is on tizanidine.  It appears that this is her only other medication for pain.  On exam, patient appears uncomfortable.  She is neurovascularly intact.  Multimodal pain control was ordered for empiric treatment of low back pain with sciatica.  Given her age, lab work and imaging studies were ordered as well.  Lab work results were unremarkable.  X-ray of left hip showed no acute findings.  CT imaging was ordered to further evaluate.  On CT lumbar spine, there was evidence of subarticular disc protrusion at L5-S1 on the left side, corresponding with her symptoms.  MRI was ordered which showed moderate degenerative change and areas of stenoses only.   Patient did have improved symptoms while in the ED.  Seems that she is unclear of why she is having this pain.  I spoke with patient's son over speaker phone with patient present to inform him of workup results today and  plan going forward.  He was able to assist in translation.  Patient was prescribed multimodal pain control for home.  She was advised to follow-up with PCP and neurosurgery.  She was discharged in stable condition. I ordered medication including lidocaine patch, Toradol, Robaxin, Percocet for analgesia Reevaluation of the patient after these medicines showed that the patient improved I have reviewed the patients home medicines and have made adjustments as needed   Social Determinants of Health:  Very limited English, has PCP        Final Clinical Impression(s) / ED Diagnoses Final diagnoses:  Acute left-sided low back pain with left-sided sciatica    Rx / DC Orders ED Discharge Orders          Ordered    predniSONE (STERAPRED UNI-PAK 21 TAB) 10 MG (21) TBPK tablet  Daily        11/27/22 1552    methocarbamol (ROBAXIN) 500 MG tablet  Every 8 hours PRN        11/27/22 1552    lidocaine (LIDODERM) 5 %  Every 24 hours        11/27/22 1552    meloxicam (MOBIC) 7.5 MG tablet  Daily        11/27/22 1552    oxyCODONE-acetaminophen (PERCOCET/ROXICET) 5-325 MG tablet  Every 8 hours PRN        11/27/22 1552              Gloris Manchester, MD 11/27/22 1553

## 2022-11-27 NOTE — ED Triage Notes (Signed)
Pt bib ems from home c/o  left leg pain and weakness that started three days ago. Pt left leg is positional d/t pain upon movement. Pt has history of chronic back pain that has been treated with injections.

## 2022-12-01 ENCOUNTER — Telehealth: Payer: Self-pay

## 2022-12-01 NOTE — Telephone Encounter (Signed)
Transition Care Management Unsuccessful Follow-up Telephone Call  Date of discharge and from where:  [facility] 5/26  Attempts:  1st Attempt  Reason for unsuccessful TCM follow-up call:  Unable to leave message   Kei Mcelhiney [facility], [place] [phone] [address], [place], [place] [address] Phone: [phone] Email: [email or web address]       

## 2022-12-02 ENCOUNTER — Telehealth: Payer: Self-pay

## 2022-12-02 NOTE — Telephone Encounter (Signed)
Transition Care Management Unsuccessful Follow-up Telephone Call  Date of discharge and from where:  [facility] 5/26  Attempts:  2nd Attempt  Reason for unsuccessful TCM follow-up call:  Unable to leave message   Lenard Forth [facility] Guide, [facility] [phone] [address] [place], [place], [place] [address] Phone: [phone] Email: [email or web address]

## 2022-12-07 ENCOUNTER — Telehealth: Payer: Self-pay

## 2022-12-07 NOTE — Telephone Encounter (Signed)
Error

## 2022-12-16 ENCOUNTER — Other Ambulatory Visit: Payer: Self-pay

## 2022-12-16 ENCOUNTER — Encounter (HOSPITAL_COMMUNITY): Payer: Self-pay

## 2022-12-16 ENCOUNTER — Emergency Department (HOSPITAL_COMMUNITY)
Admission: EM | Admit: 2022-12-16 | Discharge: 2022-12-16 | Disposition: A | Discharge status: Home / Self Care | Payer: 59 | Attending: Emergency Medicine | Admitting: Emergency Medicine

## 2022-12-16 DIAGNOSIS — R6889 Other general symptoms and signs: Secondary | ICD-10-CM | POA: Diagnosis not present

## 2022-12-16 DIAGNOSIS — M5432 Sciatica, left side: Secondary | ICD-10-CM

## 2022-12-16 DIAGNOSIS — M5442 Lumbago with sciatica, left side: Secondary | ICD-10-CM | POA: Diagnosis not present

## 2022-12-16 DIAGNOSIS — M79605 Pain in left leg: Secondary | ICD-10-CM | POA: Diagnosis present

## 2022-12-16 DIAGNOSIS — Z743 Need for continuous supervision: Secondary | ICD-10-CM | POA: Diagnosis not present

## 2022-12-16 LAB — CBC WITH DIFFERENTIAL/PLATELET
Abs Immature Granulocytes: 0.03 10*3/uL (ref 0.00–0.07)
Basophils Absolute: 0 10*3/uL (ref 0.0–0.1)
Basophils Relative: 0 %
Eosinophils Absolute: 0.2 10*3/uL (ref 0.0–0.5)
Eosinophils Relative: 2 %
HCT: 36.2 % (ref 36.0–46.0)
Hemoglobin: 11.6 g/dL — ABNORMAL LOW (ref 12.0–15.0)
Immature Granulocytes: 0 %
Lymphocytes Relative: 23 %
Lymphs Abs: 2.8 10*3/uL (ref 0.7–4.0)
MCH: 26.2 pg (ref 26.0–34.0)
MCHC: 32 g/dL (ref 30.0–36.0)
MCV: 81.7 fL (ref 80.0–100.0)
Monocytes Absolute: 0.6 10*3/uL (ref 0.1–1.0)
Monocytes Relative: 5 %
Neutro Abs: 8.3 10*3/uL — ABNORMAL HIGH (ref 1.7–7.7)
Neutrophils Relative %: 70 %
Platelets: 241 10*3/uL (ref 150–400)
RBC: 4.43 MIL/uL (ref 3.87–5.11)
RDW: 14.6 % (ref 11.5–15.5)
WBC: 12 10*3/uL — ABNORMAL HIGH (ref 4.0–10.5)
nRBC: 0 % (ref 0.0–0.2)

## 2022-12-16 LAB — BASIC METABOLIC PANEL
Anion gap: 8 (ref 5–15)
BUN: 9 mg/dL (ref 8–23)
CO2: 24 mmol/L (ref 22–32)
Calcium: 8.4 mg/dL — ABNORMAL LOW (ref 8.9–10.3)
Chloride: 95 mmol/L — ABNORMAL LOW (ref 98–111)
Creatinine, Ser: 0.54 mg/dL (ref 0.44–1.00)
GFR, Estimated: >60 mL/min (ref >60)
Glucose, Bld: 113 mg/dL — ABNORMAL HIGH (ref 70–99)
Potassium: 3.5 mmol/L (ref 3.5–5.1)
Sodium: 127 mmol/L — ABNORMAL LOW (ref 135–145)

## 2022-12-16 MED ORDER — METHYLPREDNISOLONE SODIUM SUCC 125 MG IJ SOLR
125.0000 mg | Freq: Once | INTRAMUSCULAR | Status: AC
  Administered 2022-12-16: 125 mg via INTRAVENOUS
  Filled 2022-12-16: qty 2

## 2022-12-16 MED ORDER — ONDANSETRON HCL 4 MG/2ML IJ SOLN
4.0000 mg | Freq: Once | INTRAMUSCULAR | Status: AC
  Administered 2022-12-16: 4 mg via INTRAVENOUS
  Filled 2022-12-16: qty 2

## 2022-12-16 MED ORDER — DIAZEPAM 5 MG/ML IJ SOLN
2.5000 mg | Freq: Once | INTRAMUSCULAR | Status: AC
  Administered 2022-12-16: 2.5 mg via INTRAVENOUS
  Filled 2022-12-16: qty 2

## 2022-12-16 MED ORDER — OXYCODONE-ACETAMINOPHEN 5-325 MG PO TABS: 1.0000 | ORAL_TABLET | ORAL | 0 refills | Status: DC | PRN

## 2022-12-16 MED ORDER — METHYLPREDNISOLONE 4 MG PO TBPK: ORAL_TABLET | ORAL | 0 refills | Status: DC

## 2022-12-16 MED ORDER — HYDROMORPHONE HCL 1 MG/ML IJ SOLN
1.0000 mg | Freq: Once | INTRAMUSCULAR | Status: AC
  Administered 2022-12-16: 1 mg via INTRAVENOUS
  Filled 2022-12-16: qty 1

## 2022-12-16 MED ORDER — ONDANSETRON 4 MG PO TBDP: ORAL_TABLET | ORAL | 0 refills | Status: DC

## 2022-12-16 NOTE — ED Triage Notes (Addendum)
Pt BIB [facility] from home d/t bil leg pain since this morning. Did take Percocet & Muscle relaxer but it made her sick, no vomiting. Was seen on 11/27/22 for similar s/s & was Tx for sciatica. A/Ox4, rates pain 10/10 while in triage, VSS.

## 2022-12-16 NOTE — ED Provider Notes (Signed)
[place] EMERGENCY DEPARTMENT AT [facility] Provider Note   CSN: [id number] Arrival date & time: 12/16/22  1352     History  Chief Complaint  Patient presents with   Bil Leg pain    Samantha [name] is a 82 y.o. female here presenting with back pain and left leg pain.  Patient was seen here about 2 weeks ago.  She had MRI that was unremarkable but was diagnosed with sciatica.  Patient finished a course of steroids.  She states that she did not tolerate the Percocet that well so has not been taking it.  She states that every time she takes the Percocet she throws up.  Patient states that she has worsening left leg pain.  Denies any trouble urinating.  The history is provided by the patient.       Home Medications Prior to Admission medications   Medication Sig Start Date End Date Taking? Authorizing Provider  acetaminophen (TYLENOL) 500 MG tablet Take 500 mg by mouth every 6 (six) hours as needed.    [provider]  albuterol (VENTOLIN HFA) 108 (90 Base) MCG/ACT inhaler Inhale 2 puffs into the lungs every 4 (four) hours as needed for wheezing or shortness of breath. 06/20/22   Nehemiah Settle, FNP  benzonatate (TESSALON PERLES) 100 MG capsule Take 1 capsule (100 mg total) by mouth every 6 (six) hours as needed for cough. Patient not taking: Reported on 11/24/2022 07/19/22 07/19/23  Dorothyann Peng, MD  EPINEPHrine 0.3 mg/0.3 mL IJ SOAJ injection Inject 0.3 mg into the muscle as needed for anaphylaxis. Use as directed for severe allergic reaction 12/28/21   Kozlow, Alvira Philips, MD  famotidine (PEPCID) 20 MG tablet TAKE 1 TABLET(20 MG) BY MOUTH DAILY 06/08/22   Dorothyann Peng, MD  lidocaine (LIDODERM) 5 % Place 1 patch onto the skin daily. Remove & Discard patch within 12 hours or as directed by MD. Avoid having more than 1 patch on your skin at any given time. 11/27/22   Gloris Manchester, MD  methocarbamol (ROBAXIN) 500 MG tablet Take 1 tablet (500 mg total) by mouth every 8 (eight) hours  as needed for muscle spasms. 11/27/22   Gloris Manchester, MD  oxyCODONE-acetaminophen (PERCOCET/ROXICET) 5-325 MG tablet Take 1 tablet by mouth every 8 (eight) hours as needed for severe pain. 11/27/22   Gloris Manchester, MD  pravastatin (PRAVACHOL) 20 MG tablet TAKE 1 TABLET BY MOUTH DAILY. 02/21/22   Dorothyann Peng, MD  predniSONE (STERAPRED UNI-PAK 21 TAB) 10 MG (21) TBPK tablet Take by mouth daily. Take 6 tabs by mouth daily  for 2 days, then 5 tabs for 2 days, then 4 tabs for 2 days, then 3 tabs for 2 days, 2 tabs for 2 days, then 1 tab by mouth daily for 2 days 11/27/22   Gloris Manchester, MD  [facility] 160-4.5 MCG/ACT inhaler Inhale 2 puffs into the lungs 2 (two) times daily. 06/20/22   Nehemiah Settle, FNP  Vitamin D, Ergocalciferol, (DRISDOL) 1.25 MG (50000 UNIT) CAPS capsule TAKE ONE CAPSULE BY MOUTH TWICE WEEKLY ON TUESDAYS/FRIDAYS 08/25/22   Dorothyann Peng, MD      Allergies    Ciprofloxacin, Contrast media [iodinated contrast media], Shellfish allergy, and Other    Review of Systems   Review of Systems  Musculoskeletal:        Left leg pain and back pain  All other systems reviewed and are negative.   Physical Exam Updated Vital Signs BP (!) 154/68 (BP Location: Left Arm)  Pulse 62   Temp 98.2 F (36.8 C) (Oral)   Resp 19   SpO2 97%  Physical Exam Vitals and nursing note reviewed.  HENT:     Head: Normocephalic.     Nose: Nose normal.     Mouth/Throat:     Mouth: Mucous membranes are moist.  Eyes:     Extraocular Movements: Extraocular movements intact.     Pupils: Pupils are equal, round, and reactive to light.  Cardiovascular:     Rate and Rhythm: Normal rate.     Pulses: Normal pulses.  Pulmonary:     Effort: Pulmonary effort is normal.     Breath sounds: Normal breath sounds.  Abdominal:     General: Abdomen is flat.     Palpations: Abdomen is soft.  Musculoskeletal:     Cervical back: Normal range of motion and neck supple.     Comments: L paralumbar tenderness. +  straight leg raise on the left   Neurological:     General: No focal deficit present.     Mental Status: She is alert and oriented to person, place, and time. Mental status is at baseline.     Comments: + straight leg raise L side no saddle anesthesia   Psychiatric:        Mood and Affect: Mood normal.        Behavior: Behavior normal.     ED Results / Procedures / Treatments   Labs (all labs ordered are listed, but only abnormal results are displayed) Labs Reviewed  CBC WITH DIFFERENTIAL/PLATELET - Abnormal; Notable for the following components:      Result Value   WBC 12.0 (*)    Hemoglobin 11.6 (*)    Neutro Abs 8.3 (*)    All other components within normal limits  BASIC METABOLIC PANEL - Abnormal; Notable for the following components:   Sodium 127 (*)    Chloride 95 (*)    Glucose, Bld 113 (*)    Calcium 8.4 (*)    All other components within normal limits  URINALYSIS, ROUTINE W REFLEX MICROSCOPIC    EKG None  Radiology No results found.  Procedures Procedures    Medications Ordered in ED Medications  HYDROmorphone (DILAUDID) injection 1 mg (1 mg Intravenous Given 12/16/22 1652)  ondansetron (ZOFRAN) injection 4 mg (4 mg Intravenous Given 12/16/22 1651)  diazepam (VALIUM) injection 2.5 mg (2.5 mg Intravenous Given 12/16/22 1651)  methylPREDNISolone sodium succinate (SOLU-MEDROL) 125 mg/2 mL injection 125 mg (125 mg Intravenous Given 12/16/22 1650)    ED Course/ Medical Decision Making/ A&P                             Medical Decision Making Samantha [name] is a 82 y.o. female here with sciatica symptoms.  Patient recently was seen for the same symptoms and had an MRI that was unremarkable.  Patient has no saddle anesthesia.  Patient is neurovascularly intact and just needs pain control.  Will hold off on another MRI right now.  7:01 PM Patient received pain medicine and Solu-Medrol and felt better.  Patient able to ambulate by herself.  At this point will discharge  home with another course of steroids.  Will give Zofran with pain medicine and will have her follow-up with neurosurgery    Problems Addressed: Sciatica of left side: acute illness or injury  Amount and/or Complexity of Data Reviewed Labs:  Decision-making details documented in ED Course. Radiology:  ordered and independent interpretation performed. Decision-making details documented in ED Course.  Risk Prescription drug management.    Final Clinical Impression(s) / ED Diagnoses Final diagnoses:  None    Rx / DC Orders ED Discharge Orders     None         Charlynne Pander, MD 12/16/22 1902

## 2022-12-16 NOTE — Discharge Instructions (Signed)
You likely have pinched nerve in your back  Please take Medrol Dosepak as prescribed  Please take Zofran with your pain medicine  You will need to follow-up with a neurosurgeon for possible injection in your back  Return to ER if you have severe pain or trouble walking or fevers

## 2022-12-16 NOTE — ED Provider Triage Note (Signed)
Emergency Medicine Provider Triage Evaluation Note  Samantha [name] , a 82 y.o. female  was evaluated in triage.  Pt complains of lower back and left leg pain.  She was seen for the same in the ED 2 to 3 weeks ago and states that she was discharged home with medications to treat sciatica.  Denies a fall or injury.  States that symptoms are the same from previous visit.  Denies bowel or bladder dysfunction, fever, chills, chest pain, shortness of breath, paresthesias, lower extremity weakness, abdominal pain or other complaints.  Denies a history of malignancy or IV drug use.  Review of Systems  Positive: See HPI Negative: See HPI  Physical Exam  BP (!) 154/68 (BP Location: Left Arm)   Pulse 62   Temp 98.2 F (36.8 C) (Oral)   Resp 19   SpO2 97%  Gen:   Awake, no distress   Resp:  Normal effort  MSK:   Moves extremities without difficulty  Other:  Slight decrease in strength to the left lower extremity compared to the right lower extremity but both are still 5/5, sensation intact, no midline CTL spinal tenderness, step-offs, or deformities, no meningismus, patient nontoxic-appearing, moderate left paraspinal tenderness over the lower back, abdomen soft and nontender  Medical Decision Making  Medically screening exam initiated at 2:12 PM.  Appropriate orders placed.  Brizeida Proper was informed that the remainder of the evaluation will be completed by another provider, this initial triage assessment does not replace that evaluation, and the importance of remaining in the ED until their evaluation is complete.     Tonette Lederer, PA-C 12/16/22 1422

## 2022-12-20 ENCOUNTER — Ambulatory Visit (INDEPENDENT_AMBULATORY_CARE_PROVIDER_SITE_OTHER): Payer: 59 | Admitting: Allergy and Immunology

## 2022-12-20 VITALS — BP 146/84 | HR 63 | Temp 98.2°F | Resp 16 | Ht 59.0 in | Wt 113.4 lb

## 2022-12-20 DIAGNOSIS — J3089 Other allergic rhinitis: Secondary | ICD-10-CM | POA: Diagnosis not present

## 2022-12-20 DIAGNOSIS — K219 Gastro-esophageal reflux disease without esophagitis: Secondary | ICD-10-CM

## 2022-12-20 DIAGNOSIS — J4550 Severe persistent asthma, uncomplicated: Secondary | ICD-10-CM

## 2022-12-20 DIAGNOSIS — Z91013 Allergy to seafood: Secondary | ICD-10-CM | POA: Diagnosis not present

## 2022-12-20 MED ORDER — EPINEPHRINE 0.3 MG/0.3ML IJ SOAJ: 0.3000 mg | INTRAMUSCULAR | 1 refills | Status: DC | PRN

## 2022-12-20 MED ORDER — SYMBICORT 160-4.5 MCG/ACT IN AERO: 2.0000 | INHALATION_SPRAY | Freq: Two times a day (BID) | RESPIRATORY_TRACT | 5 refills | Status: DC

## 2022-12-20 MED ORDER — ALBUTEROL SULFATE HFA 108 (90 BASE) MCG/ACT IN AERS: 2.0000 | INHALATION_SPRAY | RESPIRATORY_TRACT | 1 refills | Status: DC | PRN

## 2022-12-20 NOTE — Progress Notes (Signed)
[address] - [place] - [place] - [place] - [place]   Follow-up Note  Referring Provider: Dorothyann Peng, MD Primary Provider: Dorothyann Peng, MD Date of Office Visit: 12/20/2022  Subjective:   Samantha [name] (DOB: Dec 01, 1940) is a 82 y.o. female who [facility] in re-evaluation of the following:  HPI: Samantha [name] returns to this clinic in evaluation of asthma, allergic rhinitis, LPR, seafood allergy.  I last saw her in this clinic 28 December 2021 and she visited with our nurse practitioner on 20 June 2022.  Her asthma has been under excellent control while using Symbicort twice a day and she rarely uses a short acting bronchodilator.  She can exercise pretty well although she is somewhat limited by the fact that she has a left lower extremity radiculopathy which is presently being evaluated by orthopedics .  She has not required a systemic steroid to treat exacerbation of asthma.  She has very little problems with her upper airways at this point.  She has very little problems with her reflux at this point while using famotidine.  She does not consume seafood.  Allergies as of 12/20/2022       Reactions   Ciprofloxacin Other (See Comments)   Contrast Media [iodinated Contrast Media] Other (See Comments)   Patient breaks out in hives pt has had ct scans in the past with premeds and has not had reaction.   Shellfish Allergy Other (See Comments)   Other Nausea And Vomiting, Rash, Other (See Comments)   Seafood allergy        Medication List    acetaminophen 500 MG tablet Commonly known as: TYLENOL Take 500 mg by mouth every 6 (six) hours as needed.   albuterol 108 (90 Base) MCG/ACT inhaler Commonly known as: VENTOLIN HFA Inhale 2 puffs into the lungs every 4 (four) hours as needed for wheezing or shortness of breath.   benzonatate 100 MG capsule Commonly known as: Tessalon Perles Take 1 capsule (100 mg total) by mouth every 6  (six) hours as needed for cough.   EPINEPHrine 0.3 mg/0.3 mL Soaj injection Commonly known as: EPI-PEN Inject 0.3 mg into the muscle as needed for anaphylaxis. Use as directed for severe allergic reaction   famotidine 20 MG tablet Commonly known as: PEPCID TAKE 1 TABLET(20 MG) BY MOUTH DAILY   lidocaine 5 % Commonly known as: Lidoderm Place 1 patch onto the skin daily. Remove & Discard patch within 12 hours or as directed by MD. Avoid having more than 1 patch on your skin at any given time.   methocarbamol 500 MG tablet Commonly known as: ROBAXIN Take 1 tablet (500 mg total) by mouth every 8 (eight) hours as needed for muscle spasms.   ondansetron 4 MG disintegrating tablet Commonly known as: ZOFRAN-ODT 4mg  ODT q4 hours prn nausea/vomit   oxyCODONE-acetaminophen 5-325 MG tablet Commonly known as: Percocet Take 1 tablet by mouth every 4 (four) hours as needed.   pravastatin 20 MG tablet Commonly known as: PRAVACHOL TAKE 1 TABLET BY MOUTH DAILY.   Symbicort 160-4.5 MCG/ACT inhaler Generic drug: budesonide-formoterol Inhale 2 puffs into the lungs 2 (two) times daily.   Vitamin D (Ergocalciferol) 1.25 MG (50000 UNIT) Caps capsule Commonly known as: DRISDOL TAKE ONE CAPSULE BY MOUTH TWICE WEEKLY ON TUESDAYS/FRIDAYS     Past Medical History:  Diagnosis Date   Asthma    GERD (gastroesophageal reflux disease)    H. pylori infection    Hypercholesteremia  UTI (lower urinary tract infection)     Past Surgical History:  Procedure Laterality Date   ABDOMINAL HYSTERECTOMY     ABDOMINAL SURGERY     CESAREAN SECTION     CHOLECYSTECTOMY N/A 05/21/2018   Procedure: LAPAROSCOPIC CHOLECYSTECTOMY WITH ATTEMPTED INTRAOPERATIVE CHOLANGIOGRAM;  Surgeon: Luretha Murphy, MD;  Location: [facility];  Service: General;  Laterality: N/A;    Review of systems negative except as noted in HPI / PMHx or noted below:  Review of Systems  Constitutional: Negative.   HENT: Negative.    Eyes:  Negative.   Respiratory: Negative.    Cardiovascular: Negative.   Gastrointestinal: Negative.   Genitourinary: Negative.   Musculoskeletal: Negative.   Skin: Negative.   Neurological: Negative.   Endo/Heme/Allergies: Negative.   Psychiatric/Behavioral: Negative.       Objective:   Vitals:   12/20/22 1021  BP: (!) 146/84  Pulse: 63  Resp: 16  Temp: 98.2 F (36.8 C)  SpO2: 99%   Height: 4\' 11"  (149.9 cm)  Weight: 113 lb 6.4 oz (51.4 kg)   Physical Exam Constitutional:      Appearance: She is not diaphoretic.  HENT:     Head: Normocephalic.     Right Ear: Tympanic membrane, ear canal and external ear normal.     Left Ear: Tympanic membrane, ear canal and external ear normal.     Nose: Nose normal. No mucosal edema or rhinorrhea.     Mouth/Throat:     Pharynx: Uvula midline. No oropharyngeal exudate.  Eyes:     Conjunctiva/sclera: Conjunctivae normal.  Neck:     Thyroid: No thyromegaly.     Trachea: Trachea normal. No tracheal tenderness or tracheal deviation.  Cardiovascular:     Rate and Rhythm: Normal rate and regular rhythm.     Heart sounds: Normal heart sounds, S1 normal and S2 normal. No murmur heard. Pulmonary:     Effort: No respiratory distress.     Breath sounds: Normal breath sounds. No stridor. No wheezing or rales.  Lymphadenopathy:     Head:     Right side of head: No tonsillar adenopathy.     Left side of head: No tonsillar adenopathy.     Cervical: No cervical adenopathy.  Skin:    Findings: No erythema or rash.     Nails: There is no clubbing.  Neurological:     Mental Status: She is alert.     Diagnostics:    Spirometry was performed and demonstrated an FEV1 of 1.24 at 83 % of predicted.  Assessment and Plan:   1. Asthma, severe persistent, well-controlled   2. Other allergic rhinitis   3. LPRD (laryngopharyngeal reflux disease)   4. Seafood allergy    1.  Continue to perform Allergen avoidance measures - No seafood  2.   Continue to Treat and prevent inflammation:   A.  Symbicort 160 - 2 inhalations twice a day with spacer  3. Continue to treat reflux:   A. Famotidine 20 mg - 1 tablet 1 time per day  4.  If needed:   A.  Proventil HFA or similar 2 puffs every 4-6 hours  B.  Nasal saline  C.  Epi-Pen  5. Return to clinic in 6 months or earlier if problem  6. Plan for fall flu vaccine    Samantha [name] appears to be doing pretty well regarding her respiratory tract issue which is a combination of inflammation and reflux and she will continue on Symbicort and famotidine to address  these issues respectively.  Of course she is not going to consume any seafood.  She has a selection of agents to be utilized should they be required.  I will see her back in this clinic in 6 months or earlier if there is a problem.  Samantha Schimke, MD Allergy / Immunology [facility]

## 2022-12-20 NOTE — Patient Instructions (Addendum)
  1.  Continue to perform Allergen avoidance measures - No seafood  2.  Continue to Treat and prevent inflammation:   A.  Symbicort 160 - 2 inhalations twice a day with spacer  3. Continue to treat reflux:   A. Famotidine 20 mg - 1 tablet 1 time per day  4.  If needed:   A.  Proventil HFA or similar 2 puffs every 4-6 hours  B.  Nasal saline  C.  Epi-Pen  5. Return to clinic in 6 months or earlier if problem  6. Plan for fall flu vaccine

## 2022-12-21 ENCOUNTER — Encounter: Payer: Self-pay | Admitting: Family Medicine

## 2022-12-21 ENCOUNTER — Telehealth: Payer: Self-pay

## 2022-12-21 ENCOUNTER — Ambulatory Visit (INDEPENDENT_AMBULATORY_CARE_PROVIDER_SITE_OTHER): Payer: 59 | Admitting: Family Medicine

## 2022-12-21 ENCOUNTER — Encounter: Payer: Self-pay | Admitting: Allergy and Immunology

## 2022-12-21 VITALS — BP 160/70 | HR 68 | Temp 97.9°F | Ht 59.0 in | Wt 112.0 lb

## 2022-12-21 DIAGNOSIS — M5432 Sciatica, left side: Secondary | ICD-10-CM | POA: Diagnosis not present

## 2022-12-21 DIAGNOSIS — M48061 Spinal stenosis, lumbar region without neurogenic claudication: Secondary | ICD-10-CM | POA: Diagnosis not present

## 2022-12-21 MED ORDER — KETOROLAC TROMETHAMINE 30 MG/ML IJ SOLN
30.0000 mg | Freq: Once | INTRAMUSCULAR | Status: AC
Start: 2022-12-21 — End: 2022-12-21
  Administered 2022-12-21: 30 mg via INTRAMUSCULAR

## 2022-12-21 NOTE — Telephone Encounter (Signed)
Transition Care Management Follow-up Telephone Call Date of discharge and from where: [facility] 6/14 Patient declined call How have you been since you were released from the hospital?  Any questions or concerns?   Items Reviewed: Did the pt receive and understand the discharge instructions provided?  Medications obtained and verified?  Other?  Any new allergies since your discharge?  Dietary orders reviewed?  Do you have support at home?    Follow up appointments reviewed:  PCP Hospital f/u appt confirmed?  Scheduled to see  on  @ . Specialist Hospital f/u appt confirmed?  Scheduled to see  on  @ . Are transportation arrangements needed?  If their condition worsens, is the pt aware to call PCP or go to the Emergency Dept.?  Was the patient provided with contact information for the PCP's office or ED?  Was to pt encouraged to call back with questions or concerns?

## 2022-12-21 NOTE — Progress Notes (Signed)
I,Jameka J Llittleton,acting as a Neurosurgeon for [employer], NP.,have documented all relevant documentation on the behalf of Ruweyda Macknight, NP,as directed by  Quaniyah Bugh Moshe Salisbury, NP while in the presence of Ziana Heyliger, NP.   Subjective:  Patient ID: Samantha [name] , female    DOB: [birth year] , 82 y.o.   MRN: [id number]  Chief Complaint  Patient presents with   ER f/u    HPI  Patient presents today for a ER f/u. She went to [facility] ER on 5/25 and 12/16/22  for back pain that radiates to her left leg. In the ER she  had an MRI spine done that showed moderate degenerative disc disease and spinal stenosis.She had Prednisone 10mg  dose pack, percocet for a few days. Patient is in the office today and she reports she is still having a lot of pain.  She reports its hard to walk and sit down, stating "The medicine isnt working." Patient denies any bowel changes since increase in pain.  Patient understands little english, communicated with her through an Interpreter/ [employer] who speaks [place]     Past Medical History:  Diagnosis Date   Asthma    GERD (gastroesophageal reflux disease)    H. pylori infection    Hypercholesteremia    UTI (lower urinary tract infection)      Family History  Problem Relation Age of Onset   Cancer Mother    Asthma Father    Allergic rhinitis Father    Food Allergy Father    Urticaria Neg Hx    Eczema Neg Hx    Angioedema Neg Hx      Current Outpatient Medications:    acetaminophen (TYLENOL) 500 MG tablet, Take 500 mg by mouth every 6 (six) hours as needed., Disp: , Rfl:    albuterol (VENTOLIN HFA) 108 (90 Base) MCG/ACT inhaler, Inhale 2 puffs into the lungs every 4 (four) hours as needed for wheezing or shortness of breath., Disp: 18 g, Rfl: 1   EPINEPHrine 0.3 mg/0.3 mL IJ SOAJ injection, Inject 0.3 mg into the muscle as needed for anaphylaxis. Use as directed for severe allergic reaction, Disp: 2 each, Rfl: 1   famotidine (PEPCID) 20 MG tablet, TAKE 1 TABLET(20 MG) BY  MOUTH DAILY, Disp: 90 tablet, Rfl: 1   lidocaine (LIDODERM) 5 %, Place 1 patch onto the skin daily. Remove & Discard patch within 12 hours or as directed by MD. Avoid having more than 1 patch on your skin at any given time., Disp: 14 patch, Rfl: 0   methylPREDNISolone (MEDROL DOSEPAK) 4 MG TBPK tablet, Use as directed, Disp: 21 tablet, Rfl: 0   ondansetron (ZOFRAN-ODT) 4 MG disintegrating tablet, 4mg  ODT q4 hours prn nausea/vomit, Disp: 10 tablet, Rfl: 0   oxyCODONE-acetaminophen (PERCOCET) 5-325 MG tablet, Take 1 tablet by mouth every 4 (four) hours as needed., Disp: 8 tablet, Rfl: 0   pravastatin (PRAVACHOL) 20 MG tablet, TAKE 1 TABLET BY MOUTH DAILY., Disp: 90 tablet, Rfl: 1   SYMBICORT 160-4.5 MCG/ACT inhaler, Inhale 2 puffs into the lungs 2 (two) times daily., Disp: 10.2 g, Rfl: 5   Allergies  Allergen Reactions   Ciprofloxacin Other (See Comments)   Contrast Media [Iodinated Contrast Media] Other (See Comments)    Patient breaks out in hives pt has had ct scans in the past with premeds and has not had reaction.   Shellfish Allergy Other (See Comments)   Other Nausea And Vomiting, Rash and Other (See Comments)    Seafood allergy  Review of Systems  Constitutional:  Positive for activity change.  Respiratory: Negative.    Cardiovascular: Negative.   Musculoskeletal:  Positive for back pain and gait problem.     Today's Vitals   12/21/22 0826  BP: (!) 160/70  Pulse: 68  Temp: 97.9 F (36.6 C)  Weight: 112 lb (50.8 kg)  Height: 4\' 11"  (1.499 m)  PainSc: 10-Worst pain ever   Body mass index is 22.62 kg/m.  Wt Readings from Last 3 Encounters:  12/21/22 112 lb (50.8 kg)  12/20/22 113 lb 6.4 oz (51.4 kg)  11/27/22 116 lb 13.5 oz (53 kg)     Objective:  Physical Exam Musculoskeletal:        General: Tenderness present.  Skin:    General: Skin is warm and dry.  Neurological:     Mental Status: She is alert and oriented to person, place, and time.  Psychiatric:         Mood and Affect: Affect is tearful.         Assessment And Plan:  Sciatica of left side Assessment & Plan: Patient will get Toradol injection today due to pain.  Orders: -     Ketorolac Tromethamine  Spinal stenosis of lumbar region without neurogenic claudication Assessment & Plan: Denies bowel changes due to pain. Referred to orthopedics  Orders: -     Ambulatory referral to Orthopedic Surgery     Return if symptoms worsen or fail to improve.  Patient was given opportunity to ask questions. Patient verbalized understanding of the plan and was able to repeat key elements of the plan. All questions were answered to their satisfaction.  Joice Nazario Moshe Salisbury, NP  I, Falisha Osment Moshe Salisbury, NP, have reviewed all documentation for this visit. The documentation on 12/22/22 for the exam, diagnosis, procedures, and orders are all accurate and complete.   IF YOU HAVE BEEN REFERRED TO A SPECIALIST, IT MAY TAKE 1-2 WEEKS TO SCHEDULE/PROCESS THE REFERRAL. IF YOU HAVE NOT HEARD FROM US/SPECIALIST IN TWO WEEKS, PLEASE GIVE [place] A CALL AT [phone].   THE PATIENT IS ENCOURAGED TO PRACTICE SOCIAL DISTANCING DUE TO THE COVID-19 PANDEMIC.

## 2022-12-22 DIAGNOSIS — M5432 Sciatica, left side: Secondary | ICD-10-CM | POA: Insufficient documentation

## 2022-12-22 DIAGNOSIS — M48061 Spinal stenosis, lumbar region without neurogenic claudication: Secondary | ICD-10-CM | POA: Insufficient documentation

## 2022-12-22 NOTE — Assessment & Plan Note (Signed)
Patient will get Toradol injection today due to pain.

## 2022-12-22 NOTE — Assessment & Plan Note (Signed)
Denies bowel changes due to pain. Referred to orthopedics

## 2022-12-30 DIAGNOSIS — M545 Low back pain, unspecified: Secondary | ICD-10-CM | POA: Diagnosis not present

## 2022-12-30 DIAGNOSIS — M79662 Pain in left lower leg: Secondary | ICD-10-CM | POA: Diagnosis not present

## 2023-01-03 ENCOUNTER — Other Ambulatory Visit: Payer: Self-pay

## 2023-01-03 MED ORDER — FAMOTIDINE 20 MG PO TABS: ORAL_TABLET | ORAL | 1 refills | Status: DC

## 2023-01-04 NOTE — Congregational Nurse Program (Signed)
CN office visit with interpreter Diu Hartshorn assisting.  Patient brought ambulance bill which indicated they did not have insurance information.  Phone call to [employer]. EMS billing to provide this information and correct spelling of patient's name. Stated she went to ED due to lower back and leg pain rendering her immobile. She is now able to walk and pain has improved in  lower back and legs since receiving cortisone injection.  She has follow-up appointment scheduled with PCP next month. Brantley Fling RN, Congregational Nurse [phone]

## 2023-01-13 DIAGNOSIS — M5416 Radiculopathy, lumbar region: Secondary | ICD-10-CM | POA: Diagnosis not present

## 2023-01-23 DIAGNOSIS — M5416 Radiculopathy, lumbar region: Secondary | ICD-10-CM | POA: Diagnosis not present

## 2023-01-23 DIAGNOSIS — M5451 Vertebrogenic low back pain: Secondary | ICD-10-CM | POA: Diagnosis not present

## 2023-02-03 DIAGNOSIS — M5416 Radiculopathy, lumbar region: Secondary | ICD-10-CM | POA: Diagnosis not present

## 2023-02-03 DIAGNOSIS — M5451 Vertebrogenic low back pain: Secondary | ICD-10-CM | POA: Diagnosis not present

## 2023-02-09 DIAGNOSIS — I872 Venous insufficiency (chronic) (peripheral): Secondary | ICD-10-CM | POA: Diagnosis not present

## 2023-02-09 DIAGNOSIS — L298 Other pruritus: Secondary | ICD-10-CM | POA: Diagnosis not present

## 2023-02-14 ENCOUNTER — Ambulatory Visit: Payer: 59 | Admitting: Internal Medicine

## 2023-02-17 ENCOUNTER — Other Ambulatory Visit: Payer: Self-pay | Admitting: Internal Medicine

## 2023-02-17 DIAGNOSIS — E7800 Pure hypercholesterolemia, unspecified: Secondary | ICD-10-CM

## 2023-02-17 DIAGNOSIS — M5416 Radiculopathy, lumbar region: Secondary | ICD-10-CM | POA: Diagnosis not present

## 2023-02-17 DIAGNOSIS — M5451 Vertebrogenic low back pain: Secondary | ICD-10-CM | POA: Diagnosis not present

## 2023-02-24 DIAGNOSIS — M5451 Vertebrogenic low back pain: Secondary | ICD-10-CM | POA: Diagnosis not present

## 2023-02-24 DIAGNOSIS — M5416 Radiculopathy, lumbar region: Secondary | ICD-10-CM | POA: Diagnosis not present

## 2023-03-15 ENCOUNTER — Encounter: Payer: Self-pay | Admitting: Internal Medicine

## 2023-03-15 ENCOUNTER — Ambulatory Visit (INDEPENDENT_AMBULATORY_CARE_PROVIDER_SITE_OTHER): Payer: 59 | Admitting: Internal Medicine

## 2023-03-15 VITALS — BP 110/70 | HR 67 | Temp 97.9°F | Ht 59.0 in | Wt 113.0 lb

## 2023-03-15 DIAGNOSIS — Z23 Encounter for immunization: Secondary | ICD-10-CM | POA: Diagnosis not present

## 2023-03-15 DIAGNOSIS — L989 Disorder of the skin and subcutaneous tissue, unspecified: Secondary | ICD-10-CM | POA: Diagnosis not present

## 2023-03-15 DIAGNOSIS — R21 Rash and other nonspecific skin eruption: Secondary | ICD-10-CM | POA: Diagnosis not present

## 2023-03-15 DIAGNOSIS — E7800 Pure hypercholesterolemia, unspecified: Secondary | ICD-10-CM | POA: Diagnosis not present

## 2023-03-15 NOTE — Patient Instructions (Signed)
Cholesterol Content in Foods Cholesterol is a waxy, fat-like substance that helps to carry fat in the blood. The body needs cholesterol in small amounts, but too much cholesterol can cause damage to the arteries and heart. What foods have cholesterol?  Cholesterol is found in animal-based foods, such as meat, seafood, and dairy. Generally, low-fat dairy and lean meats have less cholesterol than full-fat dairy and fatty meats. The milligrams of cholesterol per serving (mg per serving) of common cholesterol-containing foods are listed below. Meats and other proteins Egg -- one large whole egg has 186 mg. Veal shank -- 4 oz (113 g) has 141 mg. Lean ground turkey (93% lean) -- 4 oz (113 g) has 118 mg. Fat-trimmed lamb loin -- 4 oz (113 g) has 106 mg. Lean ground beef (90% lean) -- 4 oz (113 g) has 100 mg. Lobster -- 3.5 oz (99 g) has 90 mg. Pork loin chops -- 4 oz (113 g) has 86 mg. Canned salmon -- 3.5 oz (99 g) has 83 mg. Fat-trimmed beef top loin -- 4 oz (113 g) has 78 mg. Frankfurter -- 1 frank (3.5 oz or 99 g) has 77 mg. Crab -- 3.5 oz (99 g) has 71 mg. Roasted chicken without skin, white meat -- 4 oz (113 g) has 66 mg. Light bologna -- 2 oz (57 g) has 45 mg. Deli-cut turkey -- 2 oz (57 g) has 31 mg. Canned tuna -- 3.5 oz (99 g) has 31 mg. Bacon -- 1 oz (28 g) has 29 mg. Oysters and mussels (raw) -- 3.5 oz (99 g) has 25 mg. Mackerel -- 1 oz (28 g) has 22 mg. Trout -- 1 oz (28 g) has 20 mg. Pork sausage -- 1 link (1 oz or 28 g) has 17 mg. Salmon -- 1 oz (28 g) has 16 mg. Tilapia -- 1 oz (28 g) has 14 mg. Dairy Soft-serve ice cream --  cup (4 oz or 86 g) has 103 mg. Whole-milk yogurt -- 1 cup (8 oz or 245 g) has 29 mg. Cheddar cheese -- 1 oz (28 g) has 28 mg. American cheese -- 1 oz (28 g) has 28 mg. Whole milk -- 1 cup (8 oz or 250 mL) has 23 mg. 2% milk -- 1 cup (8 oz or 250 mL) has 18 mg. Cream cheese -- 1 tablespoon (Tbsp) (14.5 g) has 15 mg. Cottage cheese --  cup (4 oz or  113 g) has 14 mg. Low-fat (1%) milk -- 1 cup (8 oz or 250 mL) has 10 mg. Sour cream -- 1 Tbsp (12 g) has 8.5 mg. Low-fat yogurt -- 1 cup (8 oz or 245 g) has 8 mg. Nonfat Greek yogurt -- 1 cup (8 oz or 228 g) has 7 mg. Half-and-half cream -- 1 Tbsp (15 mL) has 5 mg. Fats and oils Cod liver oil -- 1 tablespoon (Tbsp) (13.6 g) has 82 mg. Butter -- 1 Tbsp (14 g) has 15 mg. Lard -- 1 Tbsp (12.8 g) has 14 mg. Bacon grease -- 1 Tbsp (12.9 g) has 14 mg. Mayonnaise -- 1 Tbsp (13.8 g) has 5-10 mg. Margarine -- 1 Tbsp (14 g) has 3-10 mg. The items listed above may not be a complete list of foods with cholesterol. Exact amounts of cholesterol in these foods may vary depending on specific ingredients and brands. Contact a dietitian for more information. What foods do not have cholesterol? Most plant-based foods do not have cholesterol unless you combine them with a food that has   cholesterol. Foods without cholesterol include: Grains and cereals. Vegetables. Fruits. Vegetable oils, such as olive, canola, and sunflower oil. Legumes, such as peas, beans, and lentils. Nuts and seeds. Egg whites. The items listed above may not be a complete list of foods that do not have cholesterol. Contact a dietitian for more information. Summary The body needs cholesterol in small amounts, but too much cholesterol can cause damage to the arteries and heart. Cholesterol is found in animal-based foods, such as meat, seafood, and dairy. Generally, low-fat dairy and lean meats have less cholesterol than full-fat dairy and fatty meats. This information is not intended to replace advice given to you by your health care provider. Make sure you discuss any questions you have with your health care provider. Document Revised: 10/30/2020 Document Reviewed: 10/30/2020 Elsevier Patient Education  2024 [employer] Inc[employer]  

## 2023-03-15 NOTE — Progress Notes (Unsigned)
I,Victoria T Deloria Lair, CMA,acting as a Neurosurgeon for [name], MD.,have documented all relevant documentation on the behalf of [name], MD,as directed by  [name], MD while in the presence of [name], MD.  Subjective:  Patient ID: Samantha [name] , female    DOB: Nov 19, 1940 , 82 y.o.   MRN: [id number]  Chief Complaint  Patient presents with   Hyperlipidemia    HPI  Patient presents today for chol check. Interpreter is present.  Patient is tolerating the meds well. She is currently taking pravastatin 20mg  daily.   She reports experiencing hive like bumps on her skin.  She first noticed two weeks ago.  They were scattered all over.  Described as red and itchy, they ooze out(unable to state what comes out), then turn into dark spots. The lesions have since resolved. She denies using new cleansers, soaps, and moisturizers.   Hyperlipidemia This is a chronic problem. The problem is controlled. She has no history of diabetes. There are no known factors aggravating her hyperlipidemia. Pertinent negatives include no chest pain or shortness of breath. Current antihyperlipidemic treatment includes statins.     Past Medical History:  Diagnosis Date   Asthma    GERD (gastroesophageal reflux disease)    H. pylori infection    Hypercholesteremia    UTI (lower urinary tract infection)      Family History  Problem Relation Age of Onset   Cancer Mother    Asthma Father    Allergic rhinitis Father    Food Allergy Father    Urticaria Neg Hx    Eczema Neg Hx    Angioedema Neg Hx      Current Outpatient Medications:    acetaminophen (TYLENOL) 500 MG tablet, Take 500 mg by mouth every 6 (six) hours as needed., Disp: , Rfl:    albuterol (VENTOLIN HFA) 108 (90 Base) MCG/ACT inhaler, Inhale 2 puffs into the lungs every 4 (four) hours as needed for wheezing or shortness of breath., Disp: 18 g, Rfl: 1   famotidine (PEPCID) 20 MG tablet, TAKE 1 TABLET(20 MG) BY MOUTH DAILY, Disp:  90 tablet, Rfl: 1   pravastatin (PRAVACHOL) 20 MG tablet, TAKE 1 TABLET BY MOUTH DAILY, Disp: 90 tablet, Rfl: 1   SYMBICORT 160-4.5 MCG/ACT inhaler, Inhale 2 puffs into the lungs 2 (two) times daily., Disp: 10.2 g, Rfl: 5   EPINEPHrine 0.3 mg/0.3 mL IJ SOAJ injection, Inject 0.3 mg into the muscle as needed for anaphylaxis. Use as directed for severe allergic reaction (Patient not taking: Reported on 03/15/2023), Disp: 2 each, Rfl: 1   lidocaine (LIDODERM) 5 %, Place 1 patch onto the skin daily. Remove & Discard patch within 12 hours or as directed by MD. Avoid having more than 1 patch on your skin at any given time. (Patient not taking: Reported on 03/15/2023), Disp: 14 patch, Rfl: 0   methylPREDNISolone (MEDROL DOSEPAK) 4 MG TBPK tablet, Use as directed (Patient not taking: Reported on 03/15/2023), Disp: 21 tablet, Rfl: 0   ondansetron (ZOFRAN-ODT) 4 MG disintegrating tablet, 4mg  ODT q4 hours prn nausea/vomit (Patient not taking: Reported on 03/15/2023), Disp: 10 tablet, Rfl: 0   oxyCODONE-acetaminophen (PERCOCET) 5-325 MG tablet, Take 1 tablet by mouth every 4 (four) hours as needed. (Patient not taking: Reported on 03/15/2023), Disp: 8 tablet, Rfl: 0   Allergies  Allergen Reactions   Ciprofloxacin Other (See Comments)   Contrast Media [Iodinated Contrast Media] Other (See Comments)    Patient breaks out  in hives pt has had ct scans in the past with premeds and has not had reaction.   Shellfish Allergy Other (See Comments)   Other Nausea And Vomiting, Rash and Other (See Comments)    Seafood allergy     Review of Systems  Constitutional: Negative.   Respiratory: Negative.  Negative for shortness of breath.   Cardiovascular: Negative.  Negative for chest pain.  Gastrointestinal: Negative.   Skin:  Positive for rash.  Neurological: Negative.   Psychiatric/Behavioral: Negative.       Today's Vitals   03/15/23 1043  BP: 110/70  Pulse: 67  Temp: 97.9 F (36.6 C)  SpO2: 98%  Weight: 113  lb (51.3 kg)  Height: 4\' 11"  (1.499 m)   Body mass index is 22.82 kg/m.  Wt Readings from Last 3 Encounters:  03/15/23 113 lb (51.3 kg)  12/21/22 112 lb (50.8 kg)  12/20/22 113 lb 6.4 oz (51.4 kg)     Objective:  Physical Exam Vitals and nursing note reviewed.  Constitutional:      Appearance: Normal appearance.  HENT:     Head: Normocephalic and atraumatic.  Eyes:     Extraocular Movements: Extraocular movements intact.  Cardiovascular:     Rate and Rhythm: Normal rate and regular rhythm.     Heart sounds: Normal heart sounds.  Pulmonary:     Effort: Pulmonary effort is normal.     Breath sounds: Normal breath sounds.  Musculoskeletal:     Cervical back: Normal range of motion.  Skin:    General: Skin is warm.     Findings: Lesion present. No bruising.     Comments: Irregularly shaped hyperpigmented lesion on left lower extremity  Neurological:     General: No focal deficit present.     Mental Status: She is alert.     Cranial Nerves: Cranial nerve deficit present.  Psychiatric:        Mood and Affect: Mood normal.        Behavior: Behavior normal.         Assessment And Plan:  Pure hypercholesterolemia Assessment & Plan: Chronic, reports compliance with pravastatin. She is encouraged to follow heart healthy lifestyle.   Orders: -     CMP14+EGFR -     Lipid panel  Hypocalcemia Assessment & Plan: I will check labs as below. Encouraged to start MVI daily. I will replete as needed.   Orders: -     CMP14+EGFR -     Magnesium -     Protein electrophoresis, serum  Rash Assessment & Plan: Sx appear to have resolved. She will f/u with her dermatologist if her sx recur. Advised to take Benadryl (children's dose) or Children's Zyrtec if she has recurrence of symptoms.   Orders: -     CBC with Differential/Platelet  Skin lesion of left leg -     Ambulatory referral to Dermatology  Immunization due -     Flu Vaccine Trivalent High Dose  (Fluad)     Return if symptoms worsen or fail to improve.  Patient was given opportunity to ask questions. Patient verbalized understanding of the plan and was able to repeat key elements of the plan. All questions were answered to their satisfaction.    I, [name], MD, have reviewed all documentation for this visit. The documentation on 03/15/23 for the exam, diagnosis, procedures, and orders are all accurate and complete.   IF YOU HAVE BEEN REFERRED TO A SPECIALIST, IT MAY TAKE 1-2 WEEKS  TO SCHEDULE/PROCESS THE REFERRAL. IF YOU HAVE NOT HEARD FROM US/SPECIALIST IN TWO WEEKS, PLEASE GIVE [place] A CALL AT [phone].   THE PATIENT IS ENCOURAGED TO PRACTICE SOCIAL DISTANCING DUE TO THE COVID-19 PANDEMIC.

## 2023-03-15 NOTE — Progress Notes (Unsigned)
I,Gabe Glace T Deloria Lair, CMA,acting as a Neurosurgeon for [name], MD.,have documented all relevant documentation on the behalf of [name], MD,as directed by  [name], MD while in the presence of [name], MD.  Subjective:  Patient ID: Samantha [name] , female    DOB: August 23, 1940 , 82 y.o.   MRN: [id number]  Chief Complaint  Patient presents with   Hyperlipidemia    HPI  Patient presents today for chol check. Interpreter is present.  Patient is tolerating the meds well.  She reports experiencing hive like bumps on her skin all over. red and itchy, ooze out, then turn into dark spots. When she eats anything oily as in fried foods, she swells.    Hyperlipidemia This is a chronic problem. The problem is controlled. She has no history of diabetes. There are no known factors aggravating her hyperlipidemia. Pertinent negatives include no chest pain or shortness of breath. Current antihyperlipidemic treatment includes statins.     Past Medical History:  Diagnosis Date   Asthma    GERD (gastroesophageal reflux disease)    H. pylori infection    Hypercholesteremia    UTI (lower urinary tract infection)      Family History  Problem Relation Age of Onset   Cancer Mother    Asthma Father    Allergic rhinitis Father    Food Allergy Father    Urticaria Neg Hx    Eczema Neg Hx    Angioedema Neg Hx      Current Outpatient Medications:    acetaminophen (TYLENOL) 500 MG tablet, Take 500 mg by mouth every 6 (six) hours as needed., Disp: , Rfl:    albuterol (VENTOLIN HFA) 108 (90 Base) MCG/ACT inhaler, Inhale 2 puffs into the lungs every 4 (four) hours as needed for wheezing or shortness of breath., Disp: 18 g, Rfl: 1   famotidine (PEPCID) 20 MG tablet, TAKE 1 TABLET(20 MG) BY MOUTH DAILY, Disp: 90 tablet, Rfl: 1   pravastatin (PRAVACHOL) 20 MG tablet, TAKE 1 TABLET BY MOUTH DAILY, Disp: 90 tablet, Rfl: 1   SYMBICORT 160-4.5 MCG/ACT inhaler, Inhale 2 puffs into the lungs 2  (two) times daily., Disp: 10.2 g, Rfl: 5   EPINEPHrine 0.3 mg/0.3 mL IJ SOAJ injection, Inject 0.3 mg into the muscle as needed for anaphylaxis. Use as directed for severe allergic reaction (Patient not taking: Reported on 03/15/2023), Disp: 2 each, Rfl: 1   lidocaine (LIDODERM) 5 %, Place 1 patch onto the skin daily. Remove & Discard patch within 12 hours or as directed by MD. Avoid having more than 1 patch on your skin at any given time. (Patient not taking: Reported on 03/15/2023), Disp: 14 patch, Rfl: 0   methylPREDNISolone (MEDROL DOSEPAK) 4 MG TBPK tablet, Use as directed (Patient not taking: Reported on 03/15/2023), Disp: 21 tablet, Rfl: 0   ondansetron (ZOFRAN-ODT) 4 MG disintegrating tablet, 4mg  ODT q4 hours prn nausea/vomit (Patient not taking: Reported on 03/15/2023), Disp: 10 tablet, Rfl: 0   oxyCODONE-acetaminophen (PERCOCET) 5-325 MG tablet, Take 1 tablet by mouth every 4 (four) hours as needed. (Patient not taking: Reported on 03/15/2023), Disp: 8 tablet, Rfl: 0   Allergies  Allergen Reactions   Ciprofloxacin Other (See Comments)   Contrast Media [Iodinated Contrast Media] Other (See Comments)    Patient breaks out in hives pt has had ct scans in the past with premeds and has not had reaction.   Shellfish Allergy Other (See Comments)   Other Nausea And  Vomiting, Rash and Other (See Comments)    Seafood allergy     Review of Systems  Constitutional: Negative.   Respiratory: Negative.  Negative for shortness of breath.   Cardiovascular: Negative.  Negative for chest pain.  Neurological: Negative.   Psychiatric/Behavioral: Negative.       Today's Vitals   03/15/23 1043  BP: 110/70  Pulse: 67  Temp: 97.9 F (36.6 C)  SpO2: 98%  Weight: 113 lb (51.3 kg)  Height: 4\' 11"  (1.499 m)   Body mass index is 22.82 kg/m.  Wt Readings from Last 3 Encounters:  03/15/23 113 lb (51.3 kg)  12/21/22 112 lb (50.8 kg)  12/20/22 113 lb 6.4 oz (51.4 kg)     Objective:  Physical Exam       Assessment And Plan:  Pure hypercholesterolemia  Vitamin D deficiency disease     Return if symptoms worsen or fail to improve.  Patient was given opportunity to ask questions. Patient verbalized understanding of the plan and was able to repeat key elements of the plan. All questions were answered to their satisfaction.  [name], MD  I, [name], MD, have reviewed all documentation for this visit. The documentation on 03/15/23 for the exam, diagnosis, procedures, and orders are all accurate and complete.   IF YOU HAVE BEEN REFERRED TO A SPECIALIST, IT MAY TAKE 1-2 WEEKS TO SCHEDULE/PROCESS THE REFERRAL. IF YOU HAVE NOT HEARD FROM US/SPECIALIST IN TWO WEEKS, PLEASE GIVE [place] A CALL AT [phone].   THE PATIENT IS ENCOURAGED TO PRACTICE SOCIAL DISTANCING DUE TO THE COVID-19 PANDEMIC.

## 2023-03-16 DIAGNOSIS — L989 Disorder of the skin and subcutaneous tissue, unspecified: Secondary | ICD-10-CM | POA: Insufficient documentation

## 2023-03-16 NOTE — Assessment & Plan Note (Signed)
Sx appear to have resolved. She will f/u with her dermatologist if her sx recur. Advised to take Benadryl (children's dose) or Children's Zyrtec if she has recurrence of symptoms.

## 2023-03-16 NOTE — Assessment & Plan Note (Signed)
Chronic, reports compliance with pravastatin. She is encouraged to follow heart healthy lifestyle.

## 2023-03-16 NOTE — Assessment & Plan Note (Signed)
I will check labs as below. Encouraged to start MVI daily. I will replete as needed.

## 2023-03-19 LAB — CBC WITH DIFFERENTIAL/PLATELET
Basophils Absolute: 0.1 10*3/uL (ref 0.0–0.2)
Basos: 1 %
EOS (ABSOLUTE): 1.1 10*3/uL — ABNORMAL HIGH (ref 0.0–0.4)
Eos: 16 %
Hematocrit: 36.4 % (ref 34.0–46.6)
Hemoglobin: 11.5 g/dL (ref 11.1–15.9)
Immature Grans (Abs): 0 10*3/uL (ref 0.0–0.1)
Immature Granulocytes: 0 %
Lymphocytes Absolute: 2 10*3/uL (ref 0.7–3.1)
Lymphs: 28 %
MCH: 27.1 pg (ref 26.6–33.0)
MCHC: 31.6 g/dL (ref 31.5–35.7)
MCV: 86 fL (ref 79–97)
Monocytes Absolute: 0.3 10*3/uL (ref 0.1–0.9)
Monocytes: 4 %
Neutrophils Absolute: 3.6 10*3/uL (ref 1.4–7.0)
Neutrophils: 51 %
Platelets: 269 10*3/uL (ref 150–450)
RBC: 4.24 x10E6/uL (ref 3.77–5.28)
RDW: 13.1 % (ref 11.7–15.4)
WBC: 7.1 10*3/uL (ref 3.4–10.8)

## 2023-03-19 LAB — CMP14+EGFR
ALT: 12 IU/L (ref 0–32)
AST: 23 IU/L (ref 0–40)
Albumin: 3.9 g/dL (ref 3.7–4.7)
Alkaline Phosphatase: 62 IU/L (ref 44–121)
BUN/Creatinine Ratio: 21 (ref 12–28)
BUN: 9 mg/dL (ref 8–27)
Bilirubin Total: 0.3 mg/dL (ref 0.0–1.2)
CO2: 21 mmol/L (ref 20–29)
Calcium: 9 mg/dL (ref 8.7–10.3)
Chloride: 103 mmol/L (ref 96–106)
Creatinine, Ser: 0.42 mg/dL — ABNORMAL LOW (ref 0.57–1.00)
Globulin, Total: 3 g/dL (ref 1.5–4.5)
Glucose: 91 mg/dL (ref 70–99)
Potassium: 4.3 mmol/L (ref 3.5–5.2)
Sodium: 140 mmol/L (ref 134–144)
Total Protein: 6.9 g/dL (ref 6.0–8.5)
eGFR: 98 mL/min/{1.73_m2} (ref >59)

## 2023-03-19 LAB — LIPID PANEL
Chol/HDL Ratio: 2.8 ratio (ref 0.0–4.4)
Cholesterol, Total: 169 mg/dL (ref 100–199)
HDL: 60 mg/dL (ref >39)
LDL Chol Calc (NIH): 92 mg/dL (ref 0–99)
Triglycerides: 93 mg/dL (ref 0–149)
VLDL Cholesterol Cal: 17 mg/dL (ref 5–40)

## 2023-03-19 LAB — PROTEIN ELECTROPHORESIS, SERUM
A/G Ratio: 1 (ref 0.7–1.7)
Albumin ELP: 3.4 g/dL (ref 2.9–4.4)
Alpha 1: 0.2 g/dL (ref 0.0–0.4)
Alpha 2: 0.6 g/dL (ref 0.4–1.0)
Beta: 1 g/dL (ref 0.7–1.3)
Gamma Globulin: 1.7 g/dL (ref 0.4–1.8)
Globulin, Total: 3.5 g/dL (ref 2.2–3.9)

## 2023-03-19 LAB — MAGNESIUM: Magnesium: 2.1 mg/dL (ref 1.6–2.3)

## 2023-05-30 ENCOUNTER — Other Ambulatory Visit: Payer: Self-pay | Admitting: Internal Medicine

## 2023-05-30 ENCOUNTER — Ambulatory Visit (INDEPENDENT_AMBULATORY_CARE_PROVIDER_SITE_OTHER): Payer: 59 | Admitting: Internal Medicine

## 2023-05-30 ENCOUNTER — Encounter: Payer: Self-pay | Admitting: Internal Medicine

## 2023-05-30 VITALS — BP 118/70 | HR 55 | Temp 97.8°F | Ht 59.0 in | Wt 112.4 lb

## 2023-05-30 DIAGNOSIS — E7800 Pure hypercholesterolemia, unspecified: Secondary | ICD-10-CM | POA: Diagnosis not present

## 2023-05-30 DIAGNOSIS — M79671 Pain in right foot: Secondary | ICD-10-CM | POA: Insufficient documentation

## 2023-05-30 DIAGNOSIS — B353 Tinea pedis: Secondary | ICD-10-CM | POA: Diagnosis not present

## 2023-05-30 DIAGNOSIS — K219 Gastro-esophageal reflux disease without esophagitis: Secondary | ICD-10-CM

## 2023-05-30 MED ORDER — NYSTATIN 100000 UNIT/GM EX POWD: CUTANEOUS | 0 refills | Status: DC

## 2023-05-30 MED ORDER — PANTOPRAZOLE SODIUM 20 MG PO TBEC: 20.0000 mg | DELAYED_RELEASE_TABLET | Freq: Every day | ORAL | 1 refills | Status: DC

## 2023-05-30 NOTE — Patient Instructions (Signed)
Cholesterol Content in Foods Cholesterol is a waxy, fat-like substance that helps to carry fat in the blood. The body needs cholesterol in small amounts, but too much cholesterol can cause damage to the arteries and heart. What foods have cholesterol?  Cholesterol is found in animal-based foods, such as meat, seafood, and dairy. Generally, low-fat dairy and lean meats have less cholesterol than full-fat dairy and fatty meats. The milligrams of cholesterol per serving (mg per serving) of common cholesterol-containing foods are listed below. Meats and other proteins Egg -- one large whole egg has 186 mg. Veal shank -- 4 oz (113 g) has 141 mg. Lean ground [place] (93% lean) -- 4 oz (113 g) has 118 mg. Fat-trimmed lamb loin -- 4 oz (113 g) has 106 mg. Lean ground beef (90% lean) -- 4 oz (113 g) has 100 mg. Lobster -- 3.5 oz (99 g) has 90 mg. Pork loin chops -- 4 oz (113 g) has 86 mg. Canned salmon -- 3.5 oz (99 g) has 83 mg. Fat-trimmed beef top loin -- 4 oz (113 g) has 78 mg. Frankfurter -- 1 frank (3.5 oz or 99 g) has 77 mg. Crab -- 3.5 oz (99 g) has 71 mg. Roasted chicken without skin, white meat -- 4 oz (113 g) has 66 mg. Light bologna -- 2 oz (57 g) has 45 mg. Deli-cut [place] -- 2 oz (57 g) has 31 mg. Canned tuna -- 3.5 oz (99 g) has 31 mg. Tomasa Blase -- 1 oz (28 g) has 29 mg. Oysters and mussels (raw) -- 3.5 oz (99 g) has 25 mg. Mackerel -- 1 oz (28 g) has 22 mg. Trout -- 1 oz (28 g) has 20 mg. Pork sausage -- 1 link (1 oz or 28 g) has 17 mg. Salmon -- 1 oz (28 g) has 16 mg. Tilapia -- 1 oz (28 g) has 14 mg. Dairy Soft-serve ice cream --  cup (4 oz or 86 g) has 103 mg. Whole-milk yogurt -- 1 cup (8 oz or 245 g) has 29 mg. Cheddar cheese -- 1 oz (28 g) has 28 mg. American cheese -- 1 oz (28 g) has 28 mg. Whole milk -- 1 cup (8 oz or 250 mL) has 23 mg. 2% milk -- 1 cup (8 oz or 250 mL) has 18 mg. Cream cheese -- 1 tablespoon (Tbsp) (14.5 g) has 15 mg. Cottage cheese --  cup (4 oz or  113 g) has 14 mg. Low-fat (1%) milk -- 1 cup (8 oz or 250 mL) has 10 mg. Sour cream -- 1 Tbsp (12 g) has 8.5 mg. Low-fat yogurt -- 1 cup (8 oz or 245 g) has 8 mg. Nonfat Greek yogurt -- 1 cup (8 oz or 228 g) has 7 mg. Half-and-half cream -- 1 Tbsp (15 mL) has 5 mg. Fats and oils Cod liver oil -- 1 tablespoon (Tbsp) (13.6 g) has 82 mg. Butter -- 1 Tbsp (14 g) has 15 mg. Lard -- 1 Tbsp (12.8 g) has 14 mg. Bacon grease -- 1 Tbsp (12.9 g) has 14 mg. Mayonnaise -- 1 Tbsp (13.8 g) has 5-10 mg. Margarine -- 1 Tbsp (14 g) has 3-10 mg. The items listed above may not be a complete list of foods with cholesterol. Exact amounts of cholesterol in these foods may vary depending on specific ingredients and brands. Contact a dietitian for more information. What foods do not have cholesterol? Most plant-based foods do not have cholesterol unless you combine them with a food that has  cholesterol. Foods without cholesterol include: Grains and cereals. Vegetables. Fruits. Vegetable oils, such as olive, canola, and sunflower oil. Legumes, such as peas, beans, and lentils. Nuts and seeds. Egg whites. The items listed above may not be a complete list of foods that do not have cholesterol. Contact a dietitian for more information. Summary The body needs cholesterol in small amounts, but too much cholesterol can cause damage to the arteries and heart. Cholesterol is found in animal-based foods, such as meat, seafood, and dairy. Generally, low-fat dairy and lean meats have less cholesterol than full-fat dairy and fatty meats. This information is not intended to replace advice given to you by your health care provider. Make sure you discuss any questions you have with your health care provider. Document Revised: 10/30/2020 Document Reviewed: 10/30/2020 Elsevier Patient Education  2024 [employer].

## 2023-05-30 NOTE — Progress Notes (Signed)
I,Victoria T Deloria Lair, CMA,acting as a Neurosurgeon for [name], MD.,have documented all relevant documentation on the behalf of [name], MD,as directed by  [name], MD while in the presence of [name], MD.  Subjective:  Patient ID: Samantha [name] , female    DOB: June 02, 1941 , 82 y.o.   MRN: [id number]  Chief Complaint  Patient presents with   Hyperlipidemia    HPI  Patient presents today for cholesterol check. Interpreter is present.  Patient is tolerating the meds well. She is currently taking pravastatin 20mg  daily.   She reports the Famotidine makes her stomach burn. She admits it also does not work well for her. She would like to switch medications.     Hyperlipidemia This is a chronic problem. The problem is controlled. She has no history of diabetes. There are no known factors aggravating her hyperlipidemia. Pertinent negatives include no chest pain or shortness of breath. Current antihyperlipidemic treatment includes statins.     Past Medical History:  Diagnosis Date   Asthma    GERD (gastroesophageal reflux disease)    H. pylori infection    Hypercholesteremia    UTI (lower urinary tract infection)      Family History  Problem Relation Age of Onset   Cancer Mother    Asthma Father    Allergic rhinitis Father    Food Allergy Father    Urticaria Neg Hx    Eczema Neg Hx    Angioedema Neg Hx      Current Outpatient Medications:    acetaminophen (TYLENOL) 500 MG tablet, Take 500 mg by mouth every 6 (six) hours as needed., Disp: , Rfl:    albuterol (VENTOLIN HFA) 108 (90 Base) MCG/ACT inhaler, Inhale 2 puffs into the lungs every 4 (four) hours as needed for wheezing or shortness of breath., Disp: 18 g, Rfl: 1   nystatin powder, Apply topically to right foot twice daily as needed, Disp: 15 g, Rfl: 0   pravastatin (PRAVACHOL) 20 MG tablet, TAKE 1 TABLET BY MOUTH DAILY, Disp: 90 tablet, Rfl: 1   SYMBICORT 160-4.5 MCG/ACT inhaler, Inhale 2 puffs into  the lungs 2 (two) times daily., Disp: 10.2 g, Rfl: 5   EPINEPHrine 0.3 mg/0.3 mL IJ SOAJ injection, Inject 0.3 mg into the muscle as needed for anaphylaxis. Use as directed for severe allergic reaction (Patient not taking: Reported on 03/15/2023), Disp: 2 each, Rfl: 1   lidocaine (LIDODERM) 5 %, Place 1 patch onto the skin daily. Remove & Discard patch within 12 hours or as directed by MD. Avoid having more than 1 patch on your skin at any given time. (Patient not taking: Reported on 03/15/2023), Disp: 14 patch, Rfl: 0   methylPREDNISolone (MEDROL DOSEPAK) 4 MG TBPK tablet, Use as directed (Patient not taking: Reported on 03/15/2023), Disp: 21 tablet, Rfl: 0   ondansetron (ZOFRAN-ODT) 4 MG disintegrating tablet, 4mg  ODT q4 hours prn nausea/vomit (Patient not taking: Reported on 03/15/2023), Disp: 10 tablet, Rfl: 0   oxyCODONE-acetaminophen (PERCOCET) 5-325 MG tablet, Take 1 tablet by mouth every 4 (four) hours as needed. (Patient not taking: Reported on 03/15/2023), Disp: 8 tablet, Rfl: 0   pantoprazole (PROTONIX) 20 MG tablet, TAKE 1 TABLET(20 MG) BY MOUTH DAILY, Disp: 90 tablet, Rfl: 1   Allergies  Allergen Reactions   Ciprofloxacin Other (See Comments)   Contrast Media [Iodinated Contrast Media] Other (See Comments)    Patient breaks out in hives pt has had ct scans in the past  with premeds and has not had reaction.   Shellfish Allergy Other (See Comments)   Other Nausea And Vomiting, Rash and Other (See Comments)    Seafood allergy     Review of Systems  Constitutional: Negative.   Respiratory: Negative.  Negative for shortness of breath.   Cardiovascular: Negative.  Negative for chest pain.  Gastrointestinal:        Pos reflux/indigestion  Skin:  Positive for color change.       She also has a picture of a dark purple spot on her right foot. She experiencing itchiness. She would like looked at today.    Neurological: Negative.   Psychiatric/Behavioral: Negative.       Today's Vitals    05/30/23 1200  BP: 118/70  Pulse: (!) 55  Temp: 97.8 F (36.6 C)  SpO2: 98%  Weight: 112 lb 6.4 oz (51 kg)  Height: 4\' 11"  (1.499 m)   Body mass index is 22.7 kg/m.  Wt Readings from Last 3 Encounters:  05/30/23 112 lb 6.4 oz (51 kg)  03/15/23 113 lb (51.3 kg)  12/21/22 112 lb (50.8 kg)     Objective:  Physical Exam Vitals and nursing note reviewed.  Constitutional:      Appearance: Normal appearance.  HENT:     Head: Normocephalic and atraumatic.  Eyes:     Extraocular Movements: Extraocular movements intact.  Cardiovascular:     Rate and Rhythm: Normal rate and regular rhythm.     Heart sounds: Normal heart sounds.  Pulmonary:     Effort: Pulmonary effort is normal.     Breath sounds: Normal breath sounds.  Musculoskeletal:     Cervical back: Normal range of motion.  Skin:    General: Skin is warm.     Comments: No skin lesions noted on right foot  Neurological:     General: No focal deficit present.     Mental Status: She is alert.  Psychiatric:        Mood and Affect: Mood normal.        Behavior: Behavior normal.         Assessment And Plan:  Pure hypercholesterolemia Assessment & Plan: Chronic, reports compliance with pravastatin. She is encouraged to follow heart healthy lifestyle. Last lipid panel checked in September 2024, LDL 92. She will continue with current meds.    Tinea pedis of right foot Assessment & Plan: I will send rx nystatin powder to apply to affected area twice daily as needed.    Gastroesophageal reflux disease without esophagitis Assessment & Plan: She states famotiidine bothers her stomach. She is concerned b/c the pharmacy has dispensed a different looking pill.   Pt advised pharmacies may use generic meds from different manufacturers.  I will send rx pantoprazole 20mg  to take once daily.      Other orders -     Nystatin; Apply topically to right foot twice daily as needed  Dispense: 15 g; Refill: 0     Return if  symptoms worsen or fail to improve.  Patient was given opportunity to ask questions. Patient verbalized understanding of the plan and was able to repeat key elements of the plan. All questions were answered to their satisfaction.    I, [name], MD, have reviewed all documentation for this visit. The documentation on 05/30/23 for the exam, diagnosis, procedures, and orders are all accurate and complete.   IF YOU HAVE BEEN REFERRED TO A SPECIALIST, IT MAY TAKE 1-2 WEEKS TO SCHEDULE/PROCESS  THE REFERRAL. IF YOU HAVE NOT HEARD FROM US/SPECIALIST IN TWO WEEKS, PLEASE GIVE [place] A CALL AT [phone].   THE PATIENT IS ENCOURAGED TO PRACTICE SOCIAL DISTANCING DUE TO THE COVID-19 PANDEMIC.

## 2023-06-04 NOTE — Assessment & Plan Note (Signed)
She states famotiidine bothers her stomach. She is concerned b/c the pharmacy has dispensed a different looking pill.   Pt advised pharmacies may use generic meds from different manufacturers.  I will send rx pantoprazole 20mg  to take once daily.

## 2023-06-04 NOTE — Assessment & Plan Note (Signed)
Chronic, reports compliance with pravastatin. She is encouraged to follow heart healthy lifestyle. Last lipid panel checked in September 2024, LDL 92. She will continue with current meds.

## 2023-06-04 NOTE — Assessment & Plan Note (Signed)
I will send rx nystatin powder to apply to affected area twice daily as needed.

## 2023-06-06 ENCOUNTER — Ambulatory Visit: Payer: 59 | Admitting: Allergy and Immunology

## 2023-06-20 ENCOUNTER — Encounter: Payer: Self-pay | Admitting: Allergy and Immunology

## 2023-06-20 ENCOUNTER — Other Ambulatory Visit: Payer: Self-pay

## 2023-06-20 ENCOUNTER — Ambulatory Visit (INDEPENDENT_AMBULATORY_CARE_PROVIDER_SITE_OTHER): Payer: 59 | Admitting: Allergy and Immunology

## 2023-06-20 VITALS — BP 124/68 | HR 66 | Temp 97.9°F | Resp 16 | Wt 112.4 lb

## 2023-06-20 DIAGNOSIS — J4550 Severe persistent asthma, uncomplicated: Secondary | ICD-10-CM | POA: Diagnosis not present

## 2023-06-20 DIAGNOSIS — Z91013 Allergy to seafood: Secondary | ICD-10-CM | POA: Diagnosis not present

## 2023-06-20 DIAGNOSIS — K219 Gastro-esophageal reflux disease without esophagitis: Secondary | ICD-10-CM

## 2023-06-20 DIAGNOSIS — J3089 Other allergic rhinitis: Secondary | ICD-10-CM | POA: Diagnosis not present

## 2023-06-20 MED ORDER — SYMBICORT 160-4.5 MCG/ACT IN AERO: 2.0000 | INHALATION_SPRAY | Freq: Two times a day (BID) | RESPIRATORY_TRACT | 1 refills | Status: DC

## 2023-06-20 MED ORDER — EPINEPHRINE 0.3 MG/0.3ML IJ SOAJ: 0.3000 mg | INTRAMUSCULAR | 1 refills | Status: DC | PRN

## 2023-06-20 MED ORDER — ALBUTEROL SULFATE HFA 108 (90 BASE) MCG/ACT IN AERS: 2.0000 | INHALATION_SPRAY | RESPIRATORY_TRACT | 1 refills | Status: DC | PRN

## 2023-06-20 MED ORDER — FAMOTIDINE 20 MG PO TABS: 20.0000 mg | ORAL_TABLET | Freq: Every morning | ORAL | 1 refills | Status: DC

## 2023-06-20 MED ORDER — SPACER/AERO-HOLDING CHAMBERS DEVI: 1.0000 | 1 refills | Status: DC

## 2023-06-20 NOTE — Progress Notes (Unsigned)
[place] - [place] - [place] - [place] - [place]   Follow-up Note  Referring Provider: Dorothyann Peng, MD Primary Provider: Dorothyann Peng, MD Date of Office Visit: 06/20/2023  Subjective:   Samantha [name] (DOB: 01-15-1941) is a 82 y.o. female who [facility] in re-evaluation of the following:  HPI: Samantha [name] to this clinic in evaluation of asthma, allergic rhinitis, LPR, seafood allergy.  I last saw her in this clinic 18 December 2022.  She has had a change in her health status.  Apparently there has been some difficulty with her obtaining famotidine which is working quite well for her reflux.  She was switched to pantoprazole last week and she has developed very significant heartburn and stomach upset and now she has a sore throat and she is coughing.  Prior to that point in time her reflux was under very good control as was her upper airway and lower airway issue.  She did not require a systemic steroid or antibiotic for any type of airway issue and she rarely used the short acting bronchodilator and she attended exercise classes 2-4 times per week with no difficulty while using her Symbicort twice a day.  She does not consume seafood.  She has obtained a flu vaccine.  Allergies as of 06/20/2023       Reactions   Ciprofloxacin Other (See Comments)   Contrast Media [iodinated Contrast Media] Other (See Comments)   Patient breaks out in hives pt has had ct scans in the past with premeds and has not had reaction.   Shellfish Allergy Other (See Comments)   Other Nausea And Vomiting, Rash, Other (See Comments)   Seafood allergy        Medication List    acetaminophen 500 MG tablet Commonly known as: TYLENOL Take 500 mg by mouth every 6 (six) hours as needed.   albuterol 108 (90 Base) MCG/ACT inhaler Commonly known as: VENTOLIN HFA Inhale 2 puffs into the lungs every 4 (four) hours as needed for wheezing or shortness of breath.    EPINEPHrine 0.3 mg/0.3 mL Soaj injection Commonly known as: EPI-PEN Inject 0.3 mg into the muscle as needed for anaphylaxis. Use as directed for severe allergic reaction   lidocaine 5 % Commonly known as: Lidoderm Place 1 patch onto the skin daily. Remove & Discard patch within 12 hours or as directed by MD. Avoid having more than 1 patch on your skin at any given time.   nystatin powder Commonly known as: nystatin Apply topically to right foot twice daily as needed   ondansetron 4 MG disintegrating tablet Commonly known as: ZOFRAN-ODT 4mg  ODT q4 hours prn nausea/vomit   pantoprazole 20 MG tablet Commonly known as: PROTONIX TAKE 1 TABLET(20 MG) BY MOUTH DAILY   pravastatin 20 MG tablet Commonly known as: PRAVACHOL TAKE 1 TABLET BY MOUTH DAILY   Symbicort 160-4.5 MCG/ACT inhaler Generic drug: budesonide-formoterol Inhale 2 puffs into the lungs 2 (two) times daily.    Past Medical History:  Diagnosis Date   Asthma    GERD (gastroesophageal reflux disease)    H. pylori infection    Hypercholesteremia    UTI (lower urinary tract infection)     Past Surgical History:  Procedure Laterality Date   ABDOMINAL HYSTERECTOMY     ABDOMINAL SURGERY     CESAREAN SECTION     CHOLECYSTECTOMY N/A 05/21/2018   Procedure: LAPAROSCOPIC CHOLECYSTECTOMY WITH ATTEMPTED INTRAOPERATIVE CHOLANGIOGRAM;  Surgeon: Luretha Murphy, MD;  Location: [facility];  Service: General;  Laterality: N/A;    Review of systems negative except as noted in HPI / PMHx or noted below:  Review of Systems  Constitutional: Negative.   HENT: Negative.    Eyes: Negative.   Respiratory: Negative.    Cardiovascular: Negative.   Gastrointestinal: Negative.   Genitourinary: Negative.   Musculoskeletal: Negative.   Skin: Negative.   Neurological: Negative.   Endo/Heme/Allergies: Negative.   Psychiatric/Behavioral: Negative.       Objective:   Vitals:   06/20/23 0912  BP: 124/68  Pulse: 66  Resp: 16   Temp: 97.9 F (36.6 C)  SpO2: 98%      Weight: 112 lb 6.4 oz (51 kg)   Physical Exam Constitutional:      Appearance: She is not diaphoretic.  HENT:     Head: Normocephalic.     Right Ear: Tympanic membrane, ear canal and external ear normal.     Left Ear: Tympanic membrane, ear canal and external ear normal.     Nose: Nose normal. No mucosal edema or rhinorrhea.     Mouth/Throat:     Pharynx: Uvula midline. No oropharyngeal exudate.  Eyes:     Conjunctiva/sclera: Conjunctivae normal.  Neck:     Thyroid: No thyromegaly.     Trachea: Trachea normal. No tracheal tenderness or tracheal deviation.  Cardiovascular:     Rate and Rhythm: Normal rate and regular rhythm.     Heart sounds: Normal heart sounds, S1 normal and S2 normal. No murmur heard. Pulmonary:     Effort: No respiratory distress.     Breath sounds: Normal breath sounds. No stridor. No wheezing or rales.  Lymphadenopathy:     Head:     Right side of head: No tonsillar adenopathy.     Left side of head: No tonsillar adenopathy.     Cervical: No cervical adenopathy.  Skin:    Findings: No erythema or rash.     Nails: There is no clubbing.  Neurological:     Mental Status: She is alert.     Diagnostics:    Spirometry was performed and demonstrated an FEV1 of 1.25 at 84 % of predicted.  The patient had an Asthma Control Test with the following results:  .    Assessment and Plan:   1. Asthma, severe persistent, well-controlled   2. Other allergic rhinitis   3. LPRD (laryngopharyngeal reflux disease)   4. Seafood allergy    1.  Continue to perform Allergen avoidance measures - No seafood  2.  Continue to Treat and prevent inflammation:   A.  Symbicort 160 - 2 inhalations twice a day with spacer  3. Continue to treat reflux:   A. Famotidine (PEPCID) 20 mg - 1 time per day  4.  If needed:   A.  Albuterol -  2 inhalations every 4-6 hours  B.  Nasal saline  C.  Epi-Pen  5. Return to clinic in 6  months or earlier if problem  Tyreanna appears to have her asthma under pretty good control and her reflux was under good control until she eliminated use of famotidine for some reason tied up with either insurance coverage or availability.  I think her recent cough and sore throat and bad reflux is a reflection of using pantoprazole and she is probably not going to be able to tolerate that medication and we will get her back on famotidine.  She will remain on Symbicort and she has some medications that  she can use if needed as noted above.  Assuming she does well with this plan I will see her back in this clinic in 6 months or earlier if there is a problem.   [name], MD Allergy / Immunology  [facility]

## 2023-06-20 NOTE — Patient Instructions (Signed)
  1.  Continue to perform Allergen avoidance measures - No seafood  2.  Continue to Treat and prevent inflammation:   A.  Symbicort 160 - 2 inhalations twice a day with spacer  3. Continue to treat reflux:   A. Famotidine (PEPCID) 20 mg - 1 time per day  4.  If needed:   A.  Albuterol -  2 inhalations every 4-6 hours  B.  Nasal saline  C.  Epi-Pen  5. Return to clinic in 6 months or earlier if problem

## 2023-06-21 ENCOUNTER — Encounter: Payer: Self-pay | Admitting: Allergy and Immunology

## 2023-08-02 ENCOUNTER — Other Ambulatory Visit: Payer: Self-pay

## 2023-08-02 ENCOUNTER — Ambulatory Visit: Payer: 59

## 2023-08-02 VITALS — BP 120/64 | HR 60 | Temp 98.2°F | Ht 59.0 in | Wt 113.4 lb

## 2023-08-02 DIAGNOSIS — Z0000 Encounter for general adult medical examination without abnormal findings: Secondary | ICD-10-CM | POA: Diagnosis not present

## 2023-08-02 MED ORDER — FAMOTIDINE 20 MG PO TABS: 20.0000 mg | ORAL_TABLET | Freq: Every morning | ORAL | 1 refills | Status: DC

## 2023-08-02 NOTE — Patient Instructions (Signed)
Samantha [name] , Thank you for taking time to come for your Medicare Wellness Visit. I appreciate your ongoing commitment to your health goals. Please review the following plan we discussed and let me know if I can assist you in the future.   Referrals/Orders/Follow-Ups/Clinician Recommendations: none  This is a list of the screening recommended for you and due dates:  Health Maintenance  Topic Date Due   COVID-19 Vaccine (3 - Pfizer risk series) 12/06/2019   Medicare Annual Wellness Visit  08/01/2024   DTaP/Tdap/Td vaccine (2 - Td or Tdap) 05/25/2027   Pneumonia Vaccine  Completed   Flu Shot  Completed   DEXA scan (bone density measurement)  Completed   Zoster (Shingles) Vaccine  Completed   HPV Vaccine  Aged Out   Hepatitis C Screening  Discontinued    Advanced directives: (Declined) Advance directive discussed with you today. Even though you declined this today, please call our office should you change your mind, and we can give you the proper paperwork for you to fill out.  Next Medicare Annual Wellness Visit scheduled for next year: Yes  insert Preventive Care attachment Insert FALL PREVENTION attachment if needed

## 2023-08-02 NOTE — Progress Notes (Signed)
Subjective:   Samantha [name] is a 83 y.o. female who presents for Medicare Annual (Subsequent) preventive examination.  Visit Complete: In person    Cardiac Risk Factors include: advanced age (>4men, >62 women);dyslipidemia     Objective:    Today's Vitals   08/02/23 1203 08/02/23 1204  BP: 120/64   Pulse: 60   Temp: 98.2 F (36.8 C)   TempSrc: Oral   SpO2: 97%   Weight: 113 lb 6.4 oz (51.4 kg)   Height: 4\' 11"  (1.499 m)   PainSc:  8    Body mass index is 22.9 kg/m.     08/02/2023   12:26 PM 12/16/2022    1:57 PM 11/27/2022    7:25 AM 11/26/2022   10:23 AM 07/21/2022    8:32 AM 06/17/2021    8:43 AM 06/11/2020    2:47 PM  Advanced Directives  Does Patient Have a Medical Advance Directive? No No No No No Yes No  Type of Advance Directive      Living will   Would patient like information on creating a medical advance directive? No - Patient declined No - Patient declined No - Patient declined No - Guardian declined No - Patient declined  No - Patient declined    Current Medications (verified) Outpatient Encounter Medications as of 08/02/2023  Medication Sig   acetaminophen (TYLENOL) 500 MG tablet Take 500 mg by mouth every 6 (six) hours as needed.   albuterol (VENTOLIN HFA) 108 (90 Base) MCG/ACT inhaler Inhale 2 puffs into the lungs every 4 (four) hours as needed for wheezing or shortness of breath.   EPINEPHrine 0.3 mg/0.3 mL IJ SOAJ injection Inject 0.3 mg into the muscle as needed for anaphylaxis. Use as directed for severe allergic reaction   nystatin powder Apply topically to right foot twice daily as needed   ondansetron (ZOFRAN-ODT) 4 MG disintegrating tablet 4mg  ODT q4 hours prn nausea/vomit   pantoprazole (PROTONIX) 20 MG tablet TAKE 1 TABLET(20 MG) BY MOUTH DAILY   pravastatin (PRAVACHOL) 20 MG tablet TAKE 1 TABLET BY MOUTH DAILY   SYMBICORT 160-4.5 MCG/ACT inhaler Inhale 2 puffs into the lungs 2 (two) times daily.   famotidine (PEPCID) 20 MG tablet Take 1 tablet  (20 mg total) by mouth every morning. (Patient not taking: Reported on 08/02/2023)   lidocaine (LIDODERM) 5 % Place 1 patch onto the skin daily. Remove & Discard patch within 12 hours or as directed by MD. Avoid having more than 1 patch on your skin at any given time. (Patient not taking: Reported on 08/02/2023)   methylPREDNISolone (MEDROL DOSEPAK) 4 MG TBPK tablet Use as directed (Patient not taking: Reported on 08/02/2023)   oxyCODONE-acetaminophen (PERCOCET) 5-325 MG tablet Take 1 tablet by mouth every 4 (four) hours as needed. (Patient not taking: Reported on 08/02/2023)   Spacer/Aero-Holding Chambers DEVI 1 Device by Does not apply route as directed. (Patient not taking: Reported on 08/02/2023)   No facility-administered encounter medications on file as of 08/02/2023.    Allergies (verified) Ciprofloxacin, Contrast media [iodinated contrast media], Shellfish allergy, and Other   History: Past Medical History:  Diagnosis Date   Asthma    GERD (gastroesophageal reflux disease)    H. pylori infection    Hypercholesteremia    UTI (lower urinary tract infection)    Past Surgical History:  Procedure Laterality Date   ABDOMINAL HYSTERECTOMY     ABDOMINAL SURGERY     CESAREAN SECTION     CHOLECYSTECTOMY N/A 05/21/2018  Procedure: LAPAROSCOPIC CHOLECYSTECTOMY WITH ATTEMPTED INTRAOPERATIVE CHOLANGIOGRAM;  Surgeon: Luretha Murphy, MD;  Location: [facility];  Service: General;  Laterality: N/A;   Family History  Problem Relation Age of Onset   Cancer Mother    Asthma Father    Allergic rhinitis Father    Food Allergy Father    Urticaria Neg Hx    Eczema Neg Hx    Angioedema Neg Hx    Social History   Socioeconomic History   Marital status: Married    Spouse name: Not on file   Number of children: Not on file   Years of education: Not on file   Highest education level: Not on file  Occupational History   Occupation: retired  Tobacco Use   Smoking status: Never   Smokeless tobacco:  Never  Vaping Use   Vaping status: Never Used  Substance and Sexual Activity   Alcohol use: No    Alcohol/week: 0.0 standard drinks of alcohol   Drug use: No   Sexual activity: Not Currently  Other Topics Concern   Not on file  Social History Narrative   Not on file   Social Drivers of Health   Financial Resource Strain: Low Risk  (08/02/2023)   Overall Financial Resource Strain (CARDIA)    Difficulty of Paying Living Expenses: Not hard at all  Food Insecurity: No Food Insecurity (08/02/2023)   Hunger Vital Sign    Worried About Running Out of Food in the Last Year: Never true    Ran Out of Food in the Last Year: Never true  Transportation Needs: No Transportation Needs (08/02/2023)   PRAPARE - Administrator, Civil Service (Medical): No    Lack of Transportation (Non-Medical): No  Physical Activity: Sufficiently Active (08/02/2023)   Exercise Vital Sign    Days of Exercise per Week: 4 days    Minutes of Exercise per Session: 60 min  Stress: No Stress Concern Present (08/02/2023)   [employer] of Occupational Health - Occupational Stress Questionnaire    Feeling of Stress : Only a little  Social Connections: Moderately Integrated (08/02/2023)   Social Connection and Isolation Panel [NHANES]    Frequency of Communication with Friends and Family: More than three times a week    Frequency of Social Gatherings with Friends and Family: More than three times a week    Attends Religious Services: More than 4 times per year    Active Member of [employer] or Organizations: No    Attends Engineer, structural: Never    Marital Status: Married    Tobacco Counseling Counseling given: Not Answered   Clinical Intake:  Pre-visit preparation completed: Yes  Pain : 0-10 Pain Score: 8  Pain Type: Chronic pain Pain Location: Abdomen Pain Descriptors / Indicators: Aching Pain Onset: More than a month ago Pain Frequency: Intermittent     Nutritional Status: BMI  of 19-24  Normal Nutritional Risks: Nausea/ vomitting/ diarrhea (diarrhea 2-4 times in a month) Diabetes: No  How often do you need to have someone help you when you read instructions, pamphlets, or other written materials from your doctor or pharmacy?: 4 - Often  Interpreter Needed?: Yes Interpreter Agency: [facility] Interpreter Name: Nelta Numbers  Information entered by :: NAllen LPN   Activities of Daily Living    08/02/2023   12:11 PM  In your present state of health, do you have any difficulty performing the following activities:  Hearing? 0  Vision? 1  Comment trouble reading  Difficulty concentrating or making decisions? 0  Walking or climbing stairs? 0  Dressing or bathing? 0  Doing errands, shopping? 0  Preparing Food and eating ? N  Using the Toilet? N  In the past six months, have you accidently leaked urine? N  Do you have problems with loss of bowel control? N  Managing your Medications? N  Managing your Finances? N  Housekeeping or managing your Housekeeping? N    Patient Care Team: Dorothyann Peng, MD as PCP - General (Internal Medicine)  Indicate any recent Medical Services you may have received from other than [facility] providers in the past year (date may be approximate).     Assessment:   This is a routine wellness examination for [place].  Hearing/Vision screen Hearing Screening - Comments:: Denies hearing issues Vision Screening - Comments:: No regular eye exams,   Goals Addressed             This Visit's Progress    Patient Stated       08/02/2023, stay healthy       Depression Screen    08/02/2023   12:28 PM 03/15/2023   10:43 AM 11/24/2022   11:43 AM 08/16/2022    9:28 AM 07/21/2022    8:33 AM 06/06/2022    2:27 PM 06/17/2021    8:45 AM  PHQ 2/9 Scores  PHQ - 2 Score 0 0 0 0 0 0 0  PHQ- 9 Score  0 0        Fall Risk    08/02/2023   12:27 PM 03/15/2023   10:42 AM 11/24/2022   11:43 AM 08/16/2022    9:28 AM 07/21/2022    8:33 AM  Fall Risk   Falls  in the past year? 0 0 0 0 0  Number falls in past yr: 0 0 0 0 0  Injury with Fall? 0 0 0 0 0  Risk for fall due to : Medication side effect No Fall Risks No Fall Risks No Fall Risks Medication side effect  Follow up Falls prevention discussed;Falls evaluation completed Falls evaluation completed Falls evaluation completed Falls evaluation completed Falls prevention discussed;Education provided;Falls evaluation completed    MEDICARE RISK AT HOME: Medicare Risk at Home Any stairs in or around the home?: Yes If so, are there any without handrails?: No Home free of loose throw rugs in walkways, pet beds, electrical cords, etc?: Yes Adequate lighting in your home to reduce risk of falls?: Yes Life alert?: No Use of a cane, walker or w/c?: No Grab bars in the bathroom?: Yes Shower chair or bench in shower?: No Elevated toilet seat or a handicapped toilet?: No  TIMED UP AND GO:  Was the test performed?  Yes  Length of time to ambulate 10 feet: 5 sec Gait steady and fast without use of assistive device    Cognitive Function:  6 CIT not administered due to language barrier. Patient appears cognitive via direct observation and conversation through interpreter.        05/17/2018   10:22 AM  6CIT Screen  What Year? 0 points  What month? 0 points  What time? 0 points  Count back from 20 0 points  Months in reverse 0 points  Repeat phrase 8 points  Total Score 8 points    Immunizations Immunization History  Administered Date(s) Administered   Fluad Quad(high Dose 65+) 04/14/2019, 06/11/2020, 03/30/2021, 04/21/2022   Fluad Trivalent(High Dose 65+) 03/15/2023   PFIZER(Purple Top)SARS-COV-2 Vaccination 10/28/2019, 11/08/2019   Pneumococcal  Conjugate-13 06/10/2019   Pneumococcal Polysaccharide-23 06/24/2021   Pneumococcal-Unspecified 12/19/2005   Tdap 05/24/2017   Zoster Recombinant(Shingrix) 01/25/2022, 05/03/2022    TDAP status: Up to date  Flu Vaccine status: Up to  date  Pneumococcal vaccine status: Up to date  Covid-19 vaccine status: Information provided on how to obtain vaccines.   Qualifies for Shingles Vaccine? Yes   Zostavax completed No   Shingrix Completed?: Yes  Screening Tests Health Maintenance  Topic Date Due   COVID-19 Vaccine (3 - Pfizer risk series) 12/06/2019   Medicare Annual Wellness (AWV)  08/01/2024   DTaP/Tdap/Td (2 - Td or Tdap) 05/25/2027   Pneumonia Vaccine 27+ Years old  Completed   INFLUENZA VACCINE  Completed   DEXA SCAN  Completed   Zoster Vaccines- Shingrix  Completed   HPV VACCINES  Aged Out   Hepatitis C Screening  Discontinued    Health Maintenance  Health Maintenance Due  Topic Date Due   COVID-19 Vaccine (3 - Pfizer risk series) 12/06/2019    Colorectal cancer screening: No longer required.   Mammogram status: No longer required due to age.  Bone Density status: Completed 06/01/2016.   Lung Cancer Screening: (Low Dose CT Chest recommended if Age 20-80 years, 20 pack-year currently smoking OR have quit w/in 15years.) does not qualify.   Lung Cancer Screening Referral: no  Additional Screening:  Hepatitis C Screening: does not qualify;   Vision Screening: Recommended annual ophthalmology exams for early detection of glaucoma and other disorders of the eye. Is the patient up to date with their annual eye exam?  No  Who is the provider or what is the name of the office in which the patient attends annual eye exams? none If pt is not established with a provider, would they like to be referred to a provider to establish care? No .   Dental Screening: Recommended annual dental exams for proper oral hygiene  Diabetic Foot Exam: n/a  Community Resource Referral / Chronic Care Management: CRR required this visit?  No   CCM required this visit?  No     Plan:     I have personally reviewed and noted the following in the patient's chart:   Medical and social history Use of alcohol, tobacco  or illicit drugs  Current medications and supplements including opioid prescriptions. Patient is not currently taking opioid prescriptions. Functional ability and status Nutritional status Physical activity Advanced directives List of other physicians Hospitalizations, surgeries, and ER visits in previous 12 months Vitals Screenings to include cognitive, depression, and falls Referrals and appointments  In addition, I have reviewed and discussed with patient certain preventive protocols, quality metrics, and best practice recommendations. A written personalized care plan for preventive services as well as general preventive health recommendations were provided to patient.     Barb Merino, LPN   12/31/5282   After Visit Summary: (In Person-Printed) AVS printed and given to the patient  Nurse Notes: none

## 2023-08-02 NOTE — Addendum Note (Signed)
Addended by: Barb Merino on: 08/02/2023 12:39 PM   Modules accepted: Orders

## 2023-08-03 ENCOUNTER — Telehealth: Payer: Self-pay | Admitting: *Deleted

## 2023-08-03 NOTE — Progress Notes (Signed)
Complex Care Management Note  Care Guide Note 08/03/2023 Name: Samantha [name] MRN: [id number] DOB: Feb 09, 1941  [name] is a 83 y.o. year old female who sees Dorothyann Peng, MD for primary care. I reached out to Eloise Harman by phone today to offer complex care management services.  [name] was given information about Complex Care Management services today including:   The Complex Care Management services include support from the care team which includes your Nurse Coordinator, Clinical Social Worker, or Pharmacist.  The Complex Care Management team is here to help remove barriers to the health concerns and goals most important to you. Complex Care Management services are voluntary, and the patient may decline or stop services at any time by request to their care team member.   Complex Care Management Consent Status: Patient agreed to services and verbal consent obtained.   Follow up plan:  Telephone appointment with complex care management team member scheduled for:  08/18/23  Encounter Outcome:  Patient Scheduled  Gwenevere Ghazi  [facility]  [facility], [facility] Guide  Direct Dial: [phone]  Fax [phone]

## 2023-08-10 ENCOUNTER — Other Ambulatory Visit: Payer: Self-pay | Admitting: Internal Medicine

## 2023-08-10 DIAGNOSIS — E7800 Pure hypercholesterolemia, unspecified: Secondary | ICD-10-CM

## 2023-08-16 DIAGNOSIS — L814 Other melanin hyperpigmentation: Secondary | ICD-10-CM | POA: Diagnosis not present

## 2023-08-18 ENCOUNTER — Ambulatory Visit: Payer: Self-pay | Admitting: Licensed Clinical Social Worker

## 2023-08-18 NOTE — Patient Outreach (Signed)
  Care Coordination   08/18/2023 Name: Samantha [name] MRN: [id number] DOB: 1940/09/13   Care Coordination Outreach Attempts:  An unsuccessful outreach was attempted for an appointment today.  Follow Up Plan:  Additional outreach attempts will be made to offer the patient complex care management information and services.   Encounter Outcome:  No Answer   Care Coordination Interventions:  No, not indicated    SW attempted to call the patient on a conferance call with the interpreter [facility] [phone], the patient did not answer and message had to be left.   Jeanie Cooks, PhD [facility], [facility] Social Worker Direct Dial: [phone]  Fax: [phone]

## 2023-08-22 ENCOUNTER — Telehealth: Payer: Self-pay | Admitting: *Deleted

## 2023-08-22 NOTE — Progress Notes (Signed)
 Complex Care Management Care Guide Note  08/22/2023 Name: Samantha [name] MRN: [id number] DOB: Nov 01, 1940  Samantha [name] is a 83 y.o. year old female who is a primary care patient of Dorothyann Peng, MD and is actively engaged with the care management team. I reached out to Samantha [name] by phone today to assist with re-scheduling  with the BSW.  Follow up plan: Patient refused to reschedule call with BSW for resources for utilities services. Samantha [name] states electric bill was paid with help from niece.      Samantha [name]  [facility] Health  [facility], [facility] Guide  Direct Dial: [phone]  Fax [phone]

## 2023-08-29 ENCOUNTER — Encounter: Payer: Self-pay | Admitting: Internal Medicine

## 2023-08-29 ENCOUNTER — Ambulatory Visit (INDEPENDENT_AMBULATORY_CARE_PROVIDER_SITE_OTHER): Payer: 59 | Admitting: Internal Medicine

## 2023-08-29 VITALS — BP 126/78 | HR 67 | Temp 98.1°F | Ht 59.0 in | Wt 114.0 lb

## 2023-08-29 DIAGNOSIS — E7800 Pure hypercholesterolemia, unspecified: Secondary | ICD-10-CM

## 2023-08-29 DIAGNOSIS — K219 Gastro-esophageal reflux disease without esophagitis: Secondary | ICD-10-CM | POA: Diagnosis not present

## 2023-08-29 DIAGNOSIS — Z0000 Encounter for general adult medical examination without abnormal findings: Secondary | ICD-10-CM

## 2023-08-29 MED ORDER — PANTOPRAZOLE SODIUM 20 MG PO TBEC: 20.0000 mg | DELAYED_RELEASE_TABLET | Freq: Every day | ORAL | 1 refills | Status: DC

## 2023-08-29 NOTE — Progress Notes (Signed)
 I,Victoria T Deloria Lair, CMA,acting as a Neurosurgeon for [name], MD.,have documented all relevant documentation on the behalf of [name], MD,as directed by  [name], MD while in the presence of [name], MD.  Subjective:    Patient ID: Samantha [name] , female    DOB: [birth year] , 83 y.o.   MRN: [id number]  Chief Complaint  Patient presents with   Annual Exam    HPI  She presents today for physical exam. She is accompanied by an Interpreter.  She reports compliance with medications. She denies having any headaches, chest pain and shortness of breath.   Hyperlipidemia This is a chronic problem. The problem is controlled. There are no known factors aggravating her hyperlipidemia. Current antihyperlipidemic treatment includes statins. The current treatment provides moderate improvement of lipids. Risk factors for coronary artery disease include dyslipidemia, post-menopausal and a sedentary lifestyle.     Past Medical History:  Diagnosis Date   Asthma    GERD (gastroesophageal reflux disease)    H. pylori infection    Hypercholesteremia    UTI (lower urinary tract infection)      Family History  Problem Relation Age of Onset   Cancer Mother    Asthma Father    Allergic rhinitis Father    Food Allergy Father    Urticaria Neg Hx    Eczema Neg Hx    Angioedema Neg Hx      Current Outpatient Medications:    SYMBICORT 160-4.5 MCG/ACT inhaler, Inhale 2 puffs into the lungs 2 (two) times daily., Disp: 30.6 g, Rfl: 1   acetaminophen (TYLENOL) 500 MG tablet, Take 500 mg by mouth every 6 (six) hours as needed. (Patient not taking: Reported on 08/29/2023), Disp: , Rfl:    albuterol (VENTOLIN HFA) 108 (90 Base) MCG/ACT inhaler, Inhale 2 puffs into the lungs every 4 (four) hours as needed for wheezing or shortness of breath. (Patient not taking: Reported on 08/29/2023), Disp: 18 g, Rfl: 1   EPINEPHrine 0.3 mg/0.3 mL IJ SOAJ injection, Inject 0.3 mg into the muscle as needed  for anaphylaxis. Use as directed for severe allergic reaction (Patient not taking: Reported on 08/29/2023), Disp: 2 each, Rfl: 1   lidocaine (LIDODERM) 5 %, Place 1 patch onto the skin daily. Remove & Discard patch within 12 hours or as directed by MD. Avoid having more than 1 patch on your skin at any given time. (Patient not taking: Reported on 03/15/2023), Disp: 14 patch, Rfl: 0   methylPREDNISolone (MEDROL DOSEPAK) 4 MG TBPK tablet, Use as directed (Patient not taking: Reported on 03/15/2023), Disp: 21 tablet, Rfl: 0   nystatin powder, Apply topically to right foot twice daily as needed (Patient not taking: Reported on 08/29/2023), Disp: 15 g, Rfl: 0   ondansetron (ZOFRAN-ODT) 4 MG disintegrating tablet, 4mg  ODT q4 hours prn nausea/vomit (Patient not taking: Reported on 08/29/2023), Disp: 10 tablet, Rfl: 0   oxyCODONE-acetaminophen (PERCOCET) 5-325 MG tablet, Take 1 tablet by mouth every 4 (four) hours as needed. (Patient not taking: Reported on 03/15/2023), Disp: 8 tablet, Rfl: 0   pantoprazole (PROTONIX) 20 MG tablet, Take 1 tablet (20 mg total) by mouth daily., Disp: 90 tablet, Rfl: 1   Spacer/Aero-Holding Chambers DEVI, 1 Device by Does not apply route as directed. (Patient not taking: Reported on 08/29/2023), Disp: 1 each, Rfl: 1   Allergies  Allergen Reactions   Ciprofloxacin Other (See Comments)   Contrast Media [Iodinated Contrast Media] Other (See Comments)  Patient breaks out in hives pt has had ct scans in the past with premeds and has not had reaction.   Shellfish Allergy Other (See Comments)   Other Nausea And Vomiting, Rash and Other (See Comments)    Seafood allergy      The patient states she uses post menopausal status for birth control. No LMP recorded. Patient has had a hysterectomy.. Negative for Dysmenorrhea. Negative for: breast discharge, breast lump(s), breast pain and breast self exam. Associated symptoms include abnormal vaginal bleeding. Pertinent negatives include  abnormal bleeding (hematology), anxiety, decreased libido, depression, difficulty falling sleep, dyspareunia, history of infertility, nocturia, sexual dysfunction, sleep disturbances, urinary incontinence, urinary urgency, vaginal discharge and vaginal itching. Diet regular.The patient states her exercise level is  intermittent.  . The patient's tobacco use is:  Social History   Tobacco Use  Smoking Status Never  Smokeless Tobacco Never  . She has been exposed to passive smoke. The patient's alcohol use is:  Social History   Substance and Sexual Activity  Alcohol Use No   Alcohol/week: 0.0 standard drinks of alcohol    Review of Systems  Constitutional: Negative.   HENT: Negative.    Eyes: Negative.   Respiratory: Negative.    Cardiovascular: Negative.   Gastrointestinal: Negative.   Endocrine: Negative.   Genitourinary: Negative.   Musculoskeletal: Negative.   Skin: Negative.   Allergic/Immunologic: Negative.   Neurological: Negative.   Hematological: Negative.   Psychiatric/Behavioral: Negative.       Today's Vitals   08/29/23 1047  BP: 126/78  Pulse: 67  Temp: 98.1 F (36.7 C)  SpO2: 98%  Weight: 114 lb (51.7 kg)  Height: 4\' 11"  (1.499 m)   Body mass index is 23.03 kg/m.  Wt Readings from Last 3 Encounters:  08/29/23 114 lb (51.7 kg)  08/02/23 113 lb 6.4 oz (51.4 kg)  06/20/23 112 lb 6.4 oz (51 kg)     Objective:  Physical Exam Vitals and nursing note reviewed.  Constitutional:      Appearance: Normal appearance.  HENT:     Head: Normocephalic and atraumatic.     Right Ear: Tympanic membrane, ear canal and external ear normal.     Left Ear: Tympanic membrane, ear canal and external ear normal.     Nose:     Comments: Masked     Mouth/Throat:     Comments: Masked  Eyes:     Extraocular Movements: Extraocular movements intact.     Conjunctiva/sclera: Conjunctivae normal.     Pupils: Pupils are equal, round, and reactive to light.  Cardiovascular:      Rate and Rhythm: Normal rate and regular rhythm.     Pulses: Normal pulses.     Heart sounds: Normal heart sounds.  Pulmonary:     Effort: Pulmonary effort is normal.     Breath sounds: Normal breath sounds.  Chest:  Breasts:    Tanner Score is 5.     Right: Normal.     Left: Normal.  Abdominal:     General: Abdomen is flat. Bowel sounds are normal.     Palpations: Abdomen is soft.  Genitourinary:    Comments: deferred Musculoskeletal:        General: Normal range of motion.     Cervical back: Normal range of motion and neck supple.  Skin:    General: Skin is warm and dry.  Neurological:     General: No focal deficit present.     Mental Status: She is alert  and oriented to person, place, and time.  Psychiatric:        Mood and Affect: Mood normal.        Behavior: Behavior normal.         Assessment And Plan:     Encounter for annual health examination Assessment & Plan: A full exam was performed.  Importance of monthly self breast exams was discussed with the patient.  She is advised to get 30-45 minutes of regular exercise, no less than four to five days per week. Both weight-bearing and aerobic exercises are recommended.  She is advised to follow a healthy diet with at least six fruits/veggies per day, decrease intake of red meat and other saturated fats and to increase fish intake to twice weekly.  Meats/fish should not be fried -- baked, boiled or broiled is preferable. It is also important to cut back on your sugar intake.  Be sure to read labels - try to avoid anything with added sugar, high fructose corn syrup or other sweeteners.  If you must use a sweetener, you can try stevia or monkfruit.  It is also important to avoid artificially sweetened foods/beverages and diet drinks. Lastly, wear SPF 50 sunscreen on exposed skin and when in direct sunlight for an extended period of time.  Be sure to avoid fast food restaurants and aim for at least 60 ounces of water daily.        Pure hypercholesterolemia Assessment & Plan: Chronic, reports compliance with pravastatin. She is encouraged to follow heart healthy lifestyle. She will continue with current meds.   Orders: -     CBC -     CMP14+EGFR -     Lipid panel -     TSH  Gastroesophageal reflux disease without esophagitis Assessment & Plan: She states famotiidine bothers her stomach. She is concerned b/c the pill looks different.  I will resume  pantoprazole 20mg  daily.  If sx persist, will consider increasing the dose to 40mg  daily. I will also refer her to GI for further evaluation, may need EGD.    Other orders -     Pantoprazole Sodium; Take 1 tablet (20 mg total) by mouth daily.  Dispense: 90 tablet; Refill: 1     Return for 1 year HM, 6 month chol f/u. Marland Kitchen Patient was given opportunity to ask questions. Patient verbalized understanding of the plan and was able to repeat key elements of the plan. All questions were answered to their satisfaction.   I, [name], MD, have reviewed all documentation for this visit. The documentation on 09/04/23 for the exam, diagnosis, procedures, and orders are all accurate and complete.

## 2023-08-29 NOTE — Patient Instructions (Signed)

## 2023-08-30 LAB — CMP14+EGFR
ALT: 14 IU/L (ref 0–32)
AST: 20 IU/L (ref 0–40)
Albumin: 4.2 g/dL (ref 3.7–4.7)
Alkaline Phosphatase: 84 IU/L (ref 44–121)
BUN/Creatinine Ratio: 22 (ref 12–28)
BUN: 11 mg/dL (ref 8–27)
Bilirubin Total: 0.2 mg/dL (ref 0.0–1.2)
CO2: 25 mmol/L (ref 20–29)
Calcium: 9 mg/dL (ref 8.7–10.3)
Chloride: 99 mmol/L (ref 96–106)
Creatinine, Ser: 0.5 mg/dL — ABNORMAL LOW (ref 0.57–1.00)
Globulin, Total: 2.9 g/dL (ref 1.5–4.5)
Glucose: 86 mg/dL (ref 70–99)
Potassium: 4.2 mmol/L (ref 3.5–5.2)
Sodium: 139 mmol/L (ref 134–144)
Total Protein: 7.1 g/dL (ref 6.0–8.5)
eGFR: 94 mL/min/1.73

## 2023-08-30 LAB — CBC
Hematocrit: 37.2 % (ref 34.0–46.6)
Hemoglobin: 11.7 g/dL (ref 11.1–15.9)
MCH: 26.7 pg (ref 26.6–33.0)
MCHC: 31.5 g/dL (ref 31.5–35.7)
MCV: 85 fL (ref 79–97)
Platelets: 216 10*3/uL (ref 150–450)
RBC: 4.39 x10E6/uL (ref 3.77–5.28)
RDW: 13.1 % (ref 11.7–15.4)
WBC: 8.2 10*3/uL (ref 3.4–10.8)

## 2023-08-30 LAB — LIPID PANEL
Chol/HDL Ratio: 2.8 ratio (ref 0.0–4.4)
Cholesterol, Total: 166 mg/dL (ref 100–199)
HDL: 60 mg/dL (ref >39)
LDL Chol Calc (NIH): 93 mg/dL (ref 0–99)
Triglycerides: 67 mg/dL (ref 0–149)
VLDL Cholesterol Cal: 13 mg/dL (ref 5–40)

## 2023-08-30 LAB — TSH: TSH: 0.85 u[IU]/mL (ref 0.450–4.500)

## 2023-09-04 NOTE — Assessment & Plan Note (Signed)
 She states famotiidine bothers her stomach. She is concerned b/c the pill looks different.  I will resume  pantoprazole 20mg  daily.  If sx persist, will consider increasing the dose to 40mg  daily. I will also refer her to GI for further evaluation, may need EGD.

## 2023-09-04 NOTE — Assessment & Plan Note (Signed)
 Chronic, reports compliance with pravastatin. She is encouraged to follow heart healthy lifestyle. She will continue with current meds.

## 2023-09-04 NOTE — Assessment & Plan Note (Signed)

## 2023-09-18 DIAGNOSIS — R079 Chest pain, unspecified: Secondary | ICD-10-CM | POA: Diagnosis not present

## 2023-09-18 DIAGNOSIS — J45909 Unspecified asthma, uncomplicated: Secondary | ICD-10-CM | POA: Diagnosis not present

## 2023-09-18 DIAGNOSIS — R1310 Dysphagia, unspecified: Secondary | ICD-10-CM | POA: Diagnosis not present

## 2023-09-18 DIAGNOSIS — K219 Gastro-esophageal reflux disease without esophagitis: Secondary | ICD-10-CM | POA: Diagnosis not present

## 2023-10-10 ENCOUNTER — Encounter: Payer: Self-pay | Admitting: Internal Medicine

## 2023-10-10 DIAGNOSIS — K209 Esophagitis, unspecified without bleeding: Secondary | ICD-10-CM | POA: Diagnosis not present

## 2023-10-10 DIAGNOSIS — K219 Gastro-esophageal reflux disease without esophagitis: Secondary | ICD-10-CM | POA: Diagnosis not present

## 2023-10-10 DIAGNOSIS — K2289 Other specified disease of esophagus: Secondary | ICD-10-CM | POA: Diagnosis not present

## 2023-10-10 DIAGNOSIS — K3189 Other diseases of stomach and duodenum: Secondary | ICD-10-CM | POA: Diagnosis not present

## 2023-10-10 DIAGNOSIS — R12 Heartburn: Secondary | ICD-10-CM | POA: Diagnosis not present

## 2023-10-10 DIAGNOSIS — K31A15 Gastric intestinal metaplasia without dysplasia, involving multiple sites: Secondary | ICD-10-CM | POA: Diagnosis not present

## 2023-10-10 DIAGNOSIS — K2950 Unspecified chronic gastritis without bleeding: Secondary | ICD-10-CM | POA: Diagnosis not present

## 2023-11-01 NOTE — Congregational Nurse Program (Signed)
 CN office visit with interpreter Diu Hartshorn assisting.  Patient states she has lower back pain but does not want to go to doctor.  She continues to do gardening and housework.  Has been using a back brace and tylenol  with minimal improvement. She also has itchy rash on both sides of neck which could be poison ivy. Recommended she talk to pharmacist regarding Benadryl  cream for rash and ibuprofen and diclofenac  gel for back pain.  Raeford Bullion RN, Congregational Nurse [phone]

## 2023-11-08 DIAGNOSIS — K2100 Gastro-esophageal reflux disease with esophagitis, without bleeding: Secondary | ICD-10-CM | POA: Diagnosis not present

## 2023-11-13 ENCOUNTER — Ambulatory Visit: Payer: Self-pay

## 2023-11-13 NOTE — Telephone Encounter (Signed)
 Copied From CRM [id number]. Reason for Triage: Patient grandson(brandon) is calling in because the patient has a rash on her neck. The rash is very itchy and stinging. The patient is taking benadryl  cream but its not working. The patient phone is messed up and cannot receive calls. Ace Holder said to reach out to his father Frady Betler at [phone]    Chief Complaint: rash Symptoms: red, bumpy, and itcy Frequency: constant Pertinent Negatives: Patient denies fever Disposition: [] ED /[] Urgent Care (no appt availability in office) / [] Appointment(In office/virtual)/ []  [facility]/ [] Home Care/ [] Refused Recommended Disposition /[] [facility]/ []  Follow-up with PCP Additional Notes: per grandson, patient has a red, bumpy, rash on her neck that started appox 2 weeks ago. She's been using bendryl and topical cream with no relief. Appt scheduled for 5/13  Reason for Disposition  [1] Localized rash is very painful AND [2] no fever  [1] Applying cream or ointment AND [2] causes severe itch, burning or pain  Answer Assessment - Initial Assessment Questions 1. APPEARANCE of RASH: "Describe the rash."      Red and bump  2. LOCATION: "Where is the rash located?"      Neck,  3. NUMBER: "How many spots are there?"      Unsure  4. SIZE: "How big are the spots?" (Inches, centimeters or compare to size of a coin)      "Whole neck"  5. ONSET: "When did the rash start?"      Unsure of when it started, likely 2 weeks  6. ITCHING: "Does the rash itch?" If Yes, ask: "How bad is the itch?"  (Scale 0-10; or none, mild, moderate, severe)     Mild to moderate  7. PAIN: "Does the rash hurt?" If Yes, ask: "How bad is the pain?"  (Scale 0-10; or none, mild, moderate, severe)    - NONE (0): no pain    - MILD (1-3): doesn't interfere with normal activities     - MODERATE (4-7): interferes with normal activities or awakens from sleep     - SEVERE (8-10): excruciating pain, unable to do any  normal activities     Mild  8. OTHER SYMPTOMS: "Do you have any other symptoms?" (e.g., fever)     No  9. PREGNANCY: "Is there any chance you are pregnant?" "When was your last menstrual period?"     No  Protocols used: Rash or Redness - Localized-A-AH

## 2023-11-14 ENCOUNTER — Ambulatory Visit: Payer: Self-pay | Admitting: Internal Medicine

## 2023-11-15 ENCOUNTER — Encounter: Payer: Self-pay | Admitting: Family Medicine

## 2023-11-15 ENCOUNTER — Ambulatory Visit (INDEPENDENT_AMBULATORY_CARE_PROVIDER_SITE_OTHER): Admitting: Family Medicine

## 2023-11-15 VITALS — BP 110/60 | HR 58 | Temp 98.1°F | Ht 59.0 in | Wt 110.0 lb

## 2023-11-15 DIAGNOSIS — R21 Rash and other nonspecific skin eruption: Secondary | ICD-10-CM | POA: Diagnosis not present

## 2023-11-15 DIAGNOSIS — J4550 Severe persistent asthma, uncomplicated: Secondary | ICD-10-CM

## 2023-11-15 MED ORDER — TRIAMCINOLONE ACETONIDE 40 MG/ML IJ SUSP
40.0000 mg | Freq: Once | INTRAMUSCULAR | Status: AC
  Administered 2023-11-15: 40 mg via INTRAMUSCULAR

## 2023-11-15 MED ORDER — TRIAMCINOLONE ACETONIDE 0.025 % EX OINT: 1.0000 | TOPICAL_OINTMENT | Freq: Two times a day (BID) | CUTANEOUS | 1 refills | Status: AC | PRN

## 2023-11-15 NOTE — Progress Notes (Signed)
 I,Jameka J Llittleton, CMA,acting as a Neurosurgeon for [employer], NP.,have documented all relevant documentation on the behalf of Melodie Spry, NP,as directed by  Melodie Spry, NP while in the presence of Melodie Spry, NP.  Subjective:  Patient ID: Samantha [name] , female    DOB: 22-Mar-1941 , 83 y.o.   MRN: [id number]  Chief Complaint  Patient presents with   Rash    HPI  Patient is a 83 year old who presents today for a rash. She reports she noticed red, raised  spots on her neck and on her legs that has been coming and going for the past month. She denies the use of any new cream, soaps or detergent. She states that she has being using benadryl  cream with mild relief. She denies any fever or joint aches/pain.     Past Medical History:  Diagnosis Date   Asthma    GERD (gastroesophageal reflux disease)    H. pylori infection    Hypercholesteremia    UTI (lower urinary tract infection)      Family History  Problem Relation Age of Onset   Cancer Mother    Asthma Father    Allergic rhinitis Father    Food Allergy Father    Urticaria Neg Hx    Eczema Neg Hx    Angioedema Neg Hx      Current Outpatient Medications:    pantoprazole  (PROTONIX ) 20 MG tablet, Take 1 tablet (20 mg total) by mouth daily., Disp: 90 tablet, Rfl: 1   SYMBICORT  160-4.5 MCG/ACT inhaler, Inhale 2 puffs into the lungs 2 (two) times daily., Disp: 30.6 g, Rfl: 1   triamcinolone  (KENALOG ) 0.025 % ointment, Apply 1 Application topically 2 (two) times daily as needed. Apply to affected areas as needed, Disp: 30 g, Rfl: 1   acetaminophen  (TYLENOL ) 500 MG tablet, Take 500 mg by mouth every 6 (six) hours as needed. (Patient not taking: Reported on 11/15/2023), Disp: , Rfl:    albuterol  (VENTOLIN  HFA) 108 (90 Base) MCG/ACT inhaler, Inhale 2 puffs into the lungs every 4 (four) hours as needed for wheezing or shortness of breath. (Patient not taking: Reported on 11/15/2023), Disp: 18 g, Rfl: 1   EPINEPHrine  0.3 mg/0.3 mL IJ SOAJ  injection, Inject 0.3 mg into the muscle as needed for anaphylaxis. Use as directed for severe allergic reaction (Patient not taking: Reported on 11/15/2023), Disp: 2 each, Rfl: 1   lidocaine  (LIDODERM ) 5 %, Place 1 patch onto the skin daily. Remove & Discard patch within 12 hours or as directed by MD. Avoid having more than 1 patch on your skin at any given time. (Patient not taking: Reported on 11/15/2023), Disp: 14 patch, Rfl: 0   nystatin  powder, Apply topically to right foot twice daily as needed (Patient not taking: Reported on 11/15/2023), Disp: 15 g, Rfl: 0   ondansetron  (ZOFRAN -ODT) 4 MG disintegrating tablet, 4mg  ODT q4 hours prn nausea/vomit (Patient not taking: Reported on 11/15/2023), Disp: 10 tablet, Rfl: 0   oxyCODONE -acetaminophen  (PERCOCET) 5-325 MG tablet, Take 1 tablet by mouth every 4 (four) hours as needed. (Patient not taking: Reported on 11/15/2023), Disp: 8 tablet, Rfl: 0   Spacer/Aero-Holding Chambers DEVI, 1 Device by Does not apply route as directed. (Patient not taking: Reported on 08/02/2023), Disp: 1 each, Rfl: 1   Allergies  Allergen Reactions   Ciprofloxacin  Other (See Comments)   Contrast Media [Iodinated Contrast Media] Other (See Comments)    Patient breaks out in hives pt has had ct scans  in the past with premeds and has not had reaction.   Shellfish Allergy Other (See Comments)   Other Nausea And Vomiting, Rash and Other (See Comments)    Seafood allergy     Review of Systems  Constitutional: Negative.   HENT: Negative.    Respiratory: Negative.    Cardiovascular: Negative.  Negative for chest pain, palpitations and leg swelling.  Skin:  Positive for rash.  Psychiatric/Behavioral: Negative.       Today's Vitals   11/15/23 1133  BP: 110/60  Pulse: (!) 58  Temp: 98.1 F (36.7 C)  TempSrc: Oral  Weight: 110 lb (49.9 kg)  Height: 4\' 11"  (1.499 m)  PainSc: 0-No pain   Body mass index is 22.22 kg/m.  Wt Readings from Last 3 Encounters:  11/15/23 110 lb  (49.9 kg)  08/29/23 114 lb (51.7 kg)  08/02/23 113 lb 6.4 oz (51.4 kg)    The ASCVD Risk score (Arnett DK, et al., 2019) failed to calculate for the following reasons:   The 2019 ASCVD risk score is only valid for ages 47 to 42  Objective:  Physical Exam HENT:     Head: Normocephalic.  Cardiovascular:     Rate and Rhythm: Bradycardia present.  Pulmonary:     Effort: Pulmonary effort is normal.     Breath sounds: Normal breath sounds.  Skin:    Findings: Rash present. Rash is urticarial.  Neurological:     Mental Status: She is alert and oriented to person, place, and time.  Psychiatric:        Mood and Affect: Mood normal.        Behavior: Behavior normal.         Assessment And Plan:  Rash and nonspecific skin eruption Assessment & Plan: Apply cream to affected areas as needed  Orders: -     Triamcinolone  Acetonide; Apply 1 Application topically 2 (two) times daily as needed. Apply to affected areas as needed  Dispense: 30 g; Refill: 1 -     Triamcinolone  Acetonide    Return if symptoms worsen or fail to improve.  Patient was given opportunity to ask questions. Patient verbalized understanding of the plan and was able to repeat key elements of the plan. All questions were answered to their satisfaction.    I, Melodie Spry, NP, have reviewed all documentation for this visit. The documentation on 11/25/2023 for the exam, diagnosis, procedures, and orders are all accurate and complete.    IF YOU HAVE BEEN REFERRED TO A SPECIALIST, IT MAY TAKE 1-2 WEEKS TO SCHEDULE/PROCESS THE REFERRAL. IF YOU HAVE NOT HEARD FROM US /SPECIALIST IN TWO WEEKS, PLEASE GIVE US  A CALL AT [phone].

## 2023-11-25 DIAGNOSIS — R21 Rash and other nonspecific skin eruption: Secondary | ICD-10-CM | POA: Insufficient documentation

## 2023-11-25 NOTE — Assessment & Plan Note (Signed)
 Apply cream to affected areas as needed

## 2023-12-26 ENCOUNTER — Encounter: Payer: Self-pay | Admitting: Allergy and Immunology

## 2023-12-26 ENCOUNTER — Ambulatory Visit (INDEPENDENT_AMBULATORY_CARE_PROVIDER_SITE_OTHER): Payer: 59 | Admitting: Allergy and Immunology

## 2023-12-26 ENCOUNTER — Other Ambulatory Visit: Payer: Self-pay

## 2023-12-26 VITALS — BP 104/60 | HR 62 | Temp 97.9°F | Resp 16 | Ht 58.27 in | Wt 109.7 lb

## 2023-12-26 DIAGNOSIS — L299 Pruritus, unspecified: Secondary | ICD-10-CM

## 2023-12-26 DIAGNOSIS — Z91013 Allergy to seafood: Secondary | ICD-10-CM | POA: Diagnosis not present

## 2023-12-26 DIAGNOSIS — L501 Idiopathic urticaria: Secondary | ICD-10-CM | POA: Diagnosis not present

## 2023-12-26 DIAGNOSIS — K219 Gastro-esophageal reflux disease without esophagitis: Secondary | ICD-10-CM

## 2023-12-26 DIAGNOSIS — J3089 Other allergic rhinitis: Secondary | ICD-10-CM | POA: Diagnosis not present

## 2023-12-26 DIAGNOSIS — J4550 Severe persistent asthma, uncomplicated: Secondary | ICD-10-CM | POA: Diagnosis not present

## 2023-12-26 MED ORDER — ALBUTEROL SULFATE HFA 108 (90 BASE) MCG/ACT IN AERS: 2.0000 | INHALATION_SPRAY | RESPIRATORY_TRACT | 1 refills | Status: DC | PRN

## 2023-12-26 MED ORDER — EPINEPHRINE 0.3 MG/0.3ML IJ SOAJ
0.3000 mg | INTRAMUSCULAR | 1 refills | Status: DC | PRN
Start: 2023-12-26 — End: 2024-01-30

## 2023-12-26 MED ORDER — SPACER/AERO-HOLDING CHAMBERS DEVI: 1.0000 | 1 refills | Status: DC

## 2023-12-26 MED ORDER — PANTOPRAZOLE SODIUM 20 MG PO TBEC: 20.0000 mg | DELAYED_RELEASE_TABLET | Freq: Every morning | ORAL | 1 refills | Status: DC

## 2023-12-26 MED ORDER — BUDESONIDE-FORMOTEROL FUMARATE 160-4.5 MCG/ACT IN AERO: 2.0000 | INHALATION_SPRAY | Freq: Two times a day (BID) | RESPIRATORY_TRACT | 1 refills | Status: DC

## 2023-12-26 MED ORDER — CETIRIZINE HCL 10 MG PO TABS: 20.0000 mg | ORAL_TABLET | Freq: Two times a day (BID) | ORAL | 1 refills | Status: AC

## 2023-12-26 NOTE — Progress Notes (Unsigned)
 [place] [facility] [place] [facility] [place] [facility] [place] [facility] [place]   Follow-up Note  Referring Provider: Jarold Medici, MD Primary Provider: Jarold Medici, MD Date of Office Visit: 12/26/2023  Subjective:   Samantha [name] (DOB: 1940-10-16) is a 83 y.o. female who [facility] in re-evaluation of the following:  HPI: Lakeyshia returns to this clinic in evaluation of asthma, allergic rhinitis, LPR, seafood allergy.  I last saw her in this clinic 20 June 2023.  She has had very good control of her airway issue while consistently using Symbicort  twice a day and her requirement for short acting bronchodilators about once every 2 weeks and she can exert herself without any problem and she has not required a systemic steroid or an antibiotic for any type of airway issue.  She is having a problem with itchiness.  Apparently since March 2025 she has been having diffuse itchiness and she has red raised bumps on her skin that come and go within hours and she appears to have dermatographia with lots of red areas that pop up if she scratches her skin.  She has no other associated systemic or constitutional symptoms regarding this issue.  There is not really an obvious provoking factor giving rise to this issue.  She has not started any new medications prior to this issue and she has not started any new supplements or health foods or energy boosters prior to this issue and she has not had a change in diet or change in her environment and she does not have symptoms of an ongoing infectious disease.  Her reflux appears to be under very good control.  She is now using pantoprazole  as directed by her gastroenterologist.  She does not eat seafood.  Allergies as of 12/26/2023       Reactions   Ciprofloxacin  Other (See Comments)   Contrast Media [iodinated Contrast Media] Other (See Comments)   Patient breaks out in hives pt has had ct scans in the past with premeds and has not  had reaction.   Shellfish Allergy Other (See Comments)   Other Nausea And Vomiting, Rash, Other (See Comments)   Seafood allergy        Medication List    acetaminophen  500 MG tablet Commonly known as: TYLENOL  Take 500 mg by mouth every 6 (six) hours as needed.   albuterol  108 (90 Base) MCG/ACT inhaler Commonly known as: VENTOLIN  HFA Inhale 2 puffs into the lungs every 4 (four) hours as needed for wheezing or shortness of breath.   EPINEPHrine  0.3 mg/0.3 mL Soaj injection Commonly known as: EPI-PEN Inject 0.3 mg into the muscle as needed for anaphylaxis. Use as directed for severe allergic reaction   lidocaine  5 % Commonly known as: Lidoderm  Place 1 patch onto the skin daily. Remove & Discard patch within 12 hours or as directed by MD. Avoid having more than 1 patch on your skin at any given time.   nystatin  powder Commonly known as: nystatin  Apply topically to right foot twice daily as needed   ondansetron  4 MG disintegrating tablet Commonly known as: ZOFRAN -ODT 4mg  ODT q4 hours prn nausea/vomit   oxyCODONE -acetaminophen  5-325 MG tablet Commonly known as: Percocet Take 1 tablet by mouth every 4 (four) hours as needed.   pantoprazole  20 MG tablet Commonly known as: PROTONIX  Take 1 tablet (20 mg total) by mouth daily.   pravastatin  20 MG tablet Commonly known as: PRAVACHOL  Take 20 mg by mouth daily.   Spacer/Aero-Holding  Chambers Devi 1 Device by Does not apply route as directed.   Symbicort  160-4.5 MCG/ACT inhaler Generic drug: budesonide -formoterol  Inhale 2 puffs into the lungs 2 (two) times daily.   triamcinolone  0.025 % ointment Commonly known as: KENALOG  Apply 1 Application topically 2 (two) times daily as needed. Apply to affected areas as needed   .  Past Medical History:  Diagnosis Date   Asthma    GERD (gastroesophageal reflux disease)    H. pylori infection    Hypercholesteremia    UTI (lower urinary tract infection)     Past Surgical  History:  Procedure Laterality Date   ABDOMINAL HYSTERECTOMY     ABDOMINAL SURGERY     CESAREAN SECTION     CHOLECYSTECTOMY N/A 05/21/2018   Procedure: LAPAROSCOPIC CHOLECYSTECTOMY WITH ATTEMPTED INTRAOPERATIVE CHOLANGIOGRAM;  Surgeon: Gladis Cough, MD;  Location: [facility];  Service: General;  Laterality: N/A;    Review of systems negative except as noted in HPI / PMHx or noted below:  Review of Systems  Constitutional: Negative.   HENT: Negative.    Eyes: Negative.   Respiratory: Negative.    Cardiovascular: Negative.   Gastrointestinal: Negative.   Genitourinary: Negative.   Musculoskeletal: Negative.   Skin: Negative.   Neurological: Negative.   Endo/Heme/Allergies: Negative.   Psychiatric/Behavioral: Negative.       Objective:   Vitals:   12/26/23 0858  BP: 104/60  Pulse: 62  Resp: 16  Temp: 97.9 F (36.6 C)  SpO2: 97%   Height: 4' 10.27 (148 cm)  Weight: 109 lb 11.2 oz (49.8 kg)   Physical Exam Constitutional:      Appearance: She is not diaphoretic.  HENT:     Head: Normocephalic.     Right Ear: Tympanic membrane, ear canal and external ear normal.     Left Ear: Tympanic membrane, ear canal and external ear normal.     Nose: Nose normal. No mucosal edema or rhinorrhea.     Mouth/Throat:     Pharynx: Uvula midline. No oropharyngeal exudate.   Eyes:     Conjunctiva/sclera: Conjunctivae normal.   Neck:     Thyroid : No thyromegaly.     Trachea: Trachea normal. No tracheal tenderness or tracheal deviation.   Cardiovascular:     Rate and Rhythm: Normal rate and regular rhythm.     Heart sounds: S1 normal and S2 normal. Murmur (systolic) heard.  Pulmonary:     Effort: No respiratory distress.     Breath sounds: Normal breath sounds. No stridor. No wheezing or rales.  Lymphadenopathy:     Head:     Right side of head: No tonsillar adenopathy.     Left side of head: No tonsillar adenopathy.     Cervical: No cervical adenopathy.   Skin:     Findings: No erythema or rash.     Nails: There is no clubbing.   Neurological:     Mental Status: She is alert.     Diagnostics:    Spirometry was performed and demonstrated an FEV1 of 1.16 at 82 % of predicted.   Assessment and Plan:   1. Idiopathic urticaria   2. Pruritic disorder   3. Asthma, severe persistent, well-controlled   4. Other allergic rhinitis   5. LPRD (laryngopharyngeal reflux disease)   6. Seafood allergy     1.  Continue to perform Allergen avoidance measures - No seafood  2.  Continue to Treat and prevent inflammation:   A.  Symbicort  160 - 2 inhalations  twice a day with spacer  3. Continue to treat reflux:   A. Pantoprazole  20- 1 time per day  4.  If needed:   A.  Albuterol  -  2 inhalations every 4-6 hours  B.  Nasal saline  C.  Epi-Pen  5. For itching / hive issue:   A. Cetirizine  10 mg - 1-2 tabs 1-2 times per day (MAX=4/day)  B. Blood - CBC w/d, CMP, TSH, FT4, t. peroxidase ab. Mitochondrial ab.  6. Return to clinic in 4 weeks or earlier if problem  Zaida has some form of immune activation manifested as a diffuse pruritic disorder and urticaria and dermatographia and we will screen her blood for worrisome systemic disease contributing to this issue checking most for Hashimoto's thyroiditis and antimitochondrial antibody associated pruritus.  She can use cetirizine  at a dose that is required to prevent her from developing significant problems with this issue.  Her airway is doing very well on her Symbicort  and her reflux is doing very well on pantoprazole  and were not going to change any of this therapy.  I will contact her with the results of her blood test once they are available for review.  Camellia Denis, MD Allergy / Immunology [facility]

## 2023-12-26 NOTE — Patient Instructions (Addendum)
  1.  Continue to perform Allergen avoidance measures - No seafood  2.  Continue to Treat and prevent inflammation:   A.  Symbicort  160 - 2 inhalations twice a day with spacer  3. Continue to treat reflux:   A. Pantoprazole  20- 1 time per day  4.  If needed:   A.  Albuterol  -  2 inhalations every 4-6 hours  B.  Nasal saline  C.  Epi-Pen  5. For itching / hive issue:   A. Cetirizine  10 mg - 1-2 tabs 1-2 times per day (MAX=4/day)  B. Blood - CBC w/d, CMP, TSH, FT4, t. peroxidase ab. Mitochondrial ab.  6. Return to clinic in 4 weeks or earlier if problem

## 2023-12-27 ENCOUNTER — Encounter: Payer: Self-pay | Admitting: Allergy and Immunology

## 2023-12-27 LAB — COMPREHENSIVE METABOLIC PANEL WITH GFR
ALT: 19 IU/L (ref 0–32)
AST: 21 IU/L (ref 0–40)
Albumin: 4 g/dL (ref 3.7–4.7)
Alkaline Phosphatase: 75 IU/L (ref 44–121)
BUN/Creatinine Ratio: 19 (ref 12–28)
BUN: 11 mg/dL (ref 8–27)
Bilirubin Total: 0.3 mg/dL (ref 0.0–1.2)
CO2: 24 mmol/L (ref 20–29)
Calcium: 8.7 mg/dL (ref 8.7–10.3)
Chloride: 102 mmol/L (ref 96–106)
Creatinine, Ser: 0.58 mg/dL (ref 0.57–1.00)
Globulin, Total: 2.8 g/dL (ref 1.5–4.5)
Glucose: 80 mg/dL (ref 70–99)
Potassium: 4.1 mmol/L (ref 3.5–5.2)
Sodium: 139 mmol/L (ref 134–144)
Total Protein: 6.8 g/dL (ref 6.0–8.5)
eGFR: 90 mL/min/{1.73_m2} (ref >59)

## 2023-12-27 LAB — CBC WITH DIFFERENTIAL/PLATELET
Basophils Absolute: 0.1 10*3/uL (ref 0.0–0.2)
Basos: 1 %
EOS (ABSOLUTE): 1.6 10*3/uL — ABNORMAL HIGH (ref 0.0–0.4)
Eos: 20 %
Hematocrit: 37.1 % (ref 34.0–46.6)
Hemoglobin: 11.1 g/dL (ref 11.1–15.9)
Immature Grans (Abs): 0 10*3/uL (ref 0.0–0.1)
Immature Granulocytes: 0 %
Lymphocytes Absolute: 2.2 10*3/uL (ref 0.7–3.1)
Lymphs: 28 %
MCH: 26.4 pg — ABNORMAL LOW (ref 26.6–33.0)
MCHC: 29.9 g/dL — ABNORMAL LOW (ref 31.5–35.7)
MCV: 88 fL (ref 79–97)
Monocytes Absolute: 0.4 10*3/uL (ref 0.1–0.9)
Monocytes: 5 %
Neutrophils Absolute: 3.6 10*3/uL (ref 1.4–7.0)
Neutrophils: 46 %
Platelets: 267 10*3/uL (ref 150–450)
RBC: 4.2 x10E6/uL (ref 3.77–5.28)
RDW: 14.7 % (ref 11.7–15.4)
WBC: 7.9 10*3/uL (ref 3.4–10.8)

## 2023-12-27 LAB — TSH: TSH: 1.21 u[IU]/mL (ref 0.450–4.500)

## 2023-12-27 LAB — T4, FREE: Free T4: 1.21 ng/dL (ref 0.82–1.77)

## 2023-12-27 LAB — THYROID PEROXIDASE ANTIBODY: Thyroperoxidase Ab SerPl-aCnc: 134 [IU]/mL — ABNORMAL HIGH (ref 0–34)

## 2024-01-01 ENCOUNTER — Ambulatory Visit: Payer: Self-pay | Admitting: Allergy and Immunology

## 2024-01-15 DIAGNOSIS — J45998 Other asthma: Secondary | ICD-10-CM | POA: Diagnosis not present

## 2024-01-22 NOTE — Patient Instructions (Addendum)
  1.  Continue to perform Allergen avoidance measures - No seafood  2.  Continue to Treat and prevent inflammation:   A.  Symbicort  160 - 2 inhalations twice a day with spacer  3. Continue to treat reflux:   A. Pantoprazole  20- 1 time per day  4.  If needed:   A.  Albuterol  -  2 inhalations every 4-6 hours  B.  Nasal saline  C.  Epi-Pen  5. For itching / hive issue:   A.  Stop Cetirizine  10 mg   B. Blood -  repeat CBC w/d. We will call you with results once they are back. If eosinophils are still elevated we will need to do further testing  6.  For current sinus infection start Augmentin  875 mg taking 1 tablet twice a day for 7 days.  Also start prednisone  10 mg once a day for 4 days   Return to clinic on 01/30/24 @ 1:30 PM with Dr. Kozlow or earlier if problem

## 2024-01-23 ENCOUNTER — Encounter: Payer: Self-pay | Admitting: Family

## 2024-01-23 ENCOUNTER — Ambulatory Visit (INDEPENDENT_AMBULATORY_CARE_PROVIDER_SITE_OTHER): Admitting: Family

## 2024-01-23 ENCOUNTER — Other Ambulatory Visit: Payer: Self-pay

## 2024-01-23 VITALS — BP 110/70 | HR 57 | Temp 98.1°F | Ht <= 58 in | Wt 108.5 lb

## 2024-01-23 DIAGNOSIS — L299 Pruritus, unspecified: Secondary | ICD-10-CM

## 2024-01-23 DIAGNOSIS — R899 Unspecified abnormal finding in specimens from other organs, systems and tissues: Secondary | ICD-10-CM

## 2024-01-23 DIAGNOSIS — D721 Eosinophilia, unspecified: Secondary | ICD-10-CM

## 2024-01-23 DIAGNOSIS — J019 Acute sinusitis, unspecified: Secondary | ICD-10-CM | POA: Diagnosis not present

## 2024-01-23 DIAGNOSIS — Z91013 Allergy to seafood: Secondary | ICD-10-CM

## 2024-01-23 DIAGNOSIS — L501 Idiopathic urticaria: Secondary | ICD-10-CM | POA: Diagnosis not present

## 2024-01-23 DIAGNOSIS — J4550 Severe persistent asthma, uncomplicated: Secondary | ICD-10-CM

## 2024-01-23 MED ORDER — PREDNISONE 10 MG PO TABS
ORAL_TABLET | ORAL | 0 refills | Status: DC
Start: 2024-01-23 — End: 2024-02-27

## 2024-01-23 MED ORDER — AMOXICILLIN-POT CLAVULANATE 875-125 MG PO TABS
1.0000 | ORAL_TABLET | Freq: Two times a day (BID) | ORAL | 0 refills | Status: DC
Start: 2024-01-23 — End: 2024-02-27

## 2024-01-23 NOTE — Progress Notes (Signed)
 [address] [place] [address] Dept: [phone]  FOLLOW UP NOTE  Patient ID: Samantha [name], female    DOB: 24-Jan-1941  Age: 83 y.o. MRN: [id number] Date of Office Visit: 01/23/2024  Assessment  Chief Complaint: Follow-up (Allergies/Asthma), Cough, and Nasal Congestion (Runny nose, congestion)  HPI Samantha [name] is an 83 year old female who presents today for follow-up of idiopathic urticaria, pruritic disorder, well-controlled severe persistent asthma, allergic rhinitis, laryngopharyngeal reflux disease, and seafood allergy.  She denies any new diagnosis or surgery since her last office visit.  An interpreter is here with her today and helps provide history.  Idiopathic urticaria/pruritic disorder: She reports that this is better, but she still has some itching, but not like before.  She does not have any hives today.  She was not able to take Zyrtec  10 mg.  She tried taking 1 pill and felt drowsy for 2 days.  Discussed lab work of elevated eosinophils and the need to repeat CBC with differential to see if eosinophils are still elevated.  Also discussed that her thyroid  peroxidase was elevated.  She denies any new medications.  She reports that lately she will be fatigued anytime.  She has also had unexplained weight loss with a good appetite.  She reports her her weight was 120 pounds and now it is 108.5 pounds.  She denies fever, chills, or sweats.  She has not traveled anywhere outside the country.  She also reports that last week she had some diarrhea and she has been dealing with abdominal pain.  She denies dysphagia or anorexia.  Severe persistent asthma: She continues to take Symbicort  160/4.5 mcg 2 puffs twice a day with a spacer and has albuterol  to use as needed.  She reports productive cough with clear yellow mucus, little bit of wheezing, and nocturnal awakenings due to cough.  She denies tightness in chest, shortness of breath, fever, and chills.  This all started a week ago.  She is  not certain if it is the drainage that is causing her to cough.  She has used her albuterol  twice in the past week and reports that it did not help.  Since her last office visit she has not made any trips to the emergency room or urgent care due to breathing problems and has not required any systemic steroids.  Allergic rhinitis: She reports for the past week she has been exhausted.  Her brother's funeral was last week.  She reports sinus pressure, yellow and clear rhinorrhea, nasal congestion, and postnasal drip for the past week.  She has been doing saline rinses.  Previously she has used nasal sprays but now she does not use nasal sprays since using the saline rinse.  She has not been treated for any sinus infections since we last saw her.  She denies an allergy to penicillin allergy.  Laryngopharyngeal reflux disease: She continues to take pantoprazole  20 mg once a day.  She reports that she will sometimes have symptoms.  Seafood allergy: She continues to avoid seafood without any accidental ingestion or use of her epinephrine  autoinjector device.       Drug Allergies:  Allergies  Allergen Reactions   Ciprofloxacin  Other (See Comments)   Contrast Media [Iodinated Contrast Media] Other (See Comments)    Patient breaks out in hives pt has had ct scans in the past with premeds and has not had reaction.   Shellfish Allergy Other (See Comments)   Other Nausea And Vomiting, Rash and Other (See Comments)  Seafood allergy    Review of Systems: Review of Systems  Constitutional:  Positive for malaise/fatigue and weight loss. Negative for chills and fever.  HENT:  Positive for congestion and sinus pain.   Respiratory:  Positive for cough, sputum production and wheezing. Negative for shortness of breath.   Cardiovascular:  Negative for chest pain and palpitations.  Gastrointestinal:  Positive for abdominal pain and diarrhea. Negative for nausea and vomiting.  Skin:  Positive for itching.  Negative for rash.     Physical Exam: BP 110/70   Pulse (!) 57   Temp 98.1 F (36.7 C)   Ht 4' 9.09 (1.45 m)   Wt 108 lb 8 oz (49.2 kg)   SpO2 98%   BMI 23.41 kg/m    Physical Exam Constitutional:      Appearance: Normal appearance.  HENT:     Head: Normocephalic.     Comments: Pharynx normal, eyes normal, ears normal, nose: Bilateral lower turbinates mildly edematous and slightly erythematous    Right Ear: Tympanic membrane, ear canal and external ear normal.     Left Ear: Tympanic membrane, ear canal and external ear normal.     Mouth/Throat:     Mouth: Mucous membranes are moist.     Pharynx: Oropharynx is clear.  Eyes:     Conjunctiva/sclera: Conjunctivae normal.  Cardiovascular:     Rate and Rhythm: Regular rhythm.     Heart sounds: Normal heart sounds.  Pulmonary:     Effort: Pulmonary effort is normal.     Breath sounds: Normal breath sounds.     Comments: Lungs clear to auscultation Musculoskeletal:     Cervical back: Neck supple.  Skin:    General: Skin is warm.     Comments: No rashes or urticarial lesions noted  Neurological:     Mental Status: She is alert and oriented to person, place, and time.     Diagnostics: FVC 1.65 L (90%), FEV1 1.26 L (89%), FEV1/FVC 0.76.  Spirometry indicates normal spirometry.  Assessment and Plan: 1. Acute non-recurrent sinusitis, unspecified location   2. Not well controlled severe persistent asthma   3. Eosinophilia, unspecified type   4. Idiopathic urticaria   5. Pruritic disorder   6. Seafood allergy   7. Abnormal laboratory test result     Meds ordered this encounter  Medications   predniSONE  (DELTASONE ) 10 MG tablet    Sig: Take 1 tablet once a day for 4 days    Dispense:  4 tablet    Refill:  0   amoxicillin -clavulanate (AUGMENTIN ) 875-125 MG tablet    Sig: Take 1 tablet by mouth 2 (two) times daily.    Dispense:  14 tablet    Refill:  0    Patient Instructions   1.  Continue to perform Allergen  avoidance measures - No seafood  2.  Continue to Treat and prevent inflammation:   A.  Symbicort  160 - 2 inhalations twice a day with spacer  3. Continue to treat reflux:   A. Pantoprazole  20- 1 time per day  4.  If needed:   A.  Albuterol  -  2 inhalations every 4-6 hours  B.  Nasal saline  C.  Epi-Pen  5. For itching / hive issue:   A.  Stop Cetirizine  10 mg   B. Blood -  repeat CBC w/d. We will call you with results once they are back. If eosinophils are still elevated we will need to do further testing  6.  For current sinus infection start Augmentin  875 mg taking 1 tablet twice a day for 7 days.  Also start prednisone  10 mg once a day for 4 days   Return to clinic on 01/30/24 @ 1:30 PM with Dr. Kozlow or earlier if problem  Return in about 1 week (around 01/30/2024), or if symptoms worsen or fail to improve.    Thank you for the opportunity to care for this patient.  Please do not hesitate to contact me with questions.  Wanda Craze, FNP [facility] 

## 2024-01-23 NOTE — Addendum Note (Signed)
 Addended by: NANCEE JON SAILOR on: 01/23/2024 05:52 PM   Modules accepted: Orders

## 2024-01-24 LAB — CBC WITH DIFFERENTIAL/PLATELET
Basophils Absolute: 0.1 x10E3/uL (ref 0.0–0.2)
Basos: 1 %
EOS (ABSOLUTE): 1.6 x10E3/uL — ABNORMAL HIGH (ref 0.0–0.4)
Eos: 20 %
Hematocrit: 37.4 % (ref 34.0–46.6)
Hemoglobin: 11.3 g/dL (ref 11.1–15.9)
Immature Grans (Abs): 0 x10E3/uL (ref 0.0–0.1)
Immature Granulocytes: 0 %
Lymphocytes Absolute: 2.4 x10E3/uL (ref 0.7–3.1)
Lymphs: 30 %
MCH: 26.4 pg — ABNORMAL LOW (ref 26.6–33.0)
MCHC: 30.2 g/dL — ABNORMAL LOW (ref 31.5–35.7)
MCV: 87 fL (ref 79–97)
Monocytes Absolute: 0.4 x10E3/uL (ref 0.1–0.9)
Monocytes: 5 %
Neutrophils Absolute: 3.5 x10E3/uL (ref 1.4–7.0)
Neutrophils: 44 %
Platelets: 309 x10E3/uL (ref 150–450)
RBC: 4.28 x10E6/uL (ref 3.77–5.28)
RDW: 14.2 % (ref 11.7–15.4)
WBC: 7.9 x10E3/uL (ref 3.4–10.8)

## 2024-01-25 ENCOUNTER — Ambulatory Visit: Payer: Self-pay | Admitting: Family

## 2024-01-25 NOTE — Progress Notes (Signed)
 Will need translator  Please let Samantha [name] know that I have discussed her results with Dr. Kozlow and that he would like for you to keep your upcoming appointment with him on January 30, 2024 at 1:30 PM to discuss the next steps due to her elevated eosinophils.

## 2024-01-30 ENCOUNTER — Encounter: Payer: Self-pay | Admitting: Allergy and Immunology

## 2024-01-30 ENCOUNTER — Ambulatory Visit
Admission: RE | Admit: 2024-01-30 | Discharge: 2024-01-30 | Disposition: A | Discharge status: Home / Self Care | Source: Ambulatory Visit | Attending: Allergy and Immunology | Admitting: Allergy and Immunology

## 2024-01-30 ENCOUNTER — Ambulatory Visit (INDEPENDENT_AMBULATORY_CARE_PROVIDER_SITE_OTHER): Admitting: Allergy and Immunology

## 2024-01-30 VITALS — BP 100/60 | HR 60 | Temp 98.1°F

## 2024-01-30 DIAGNOSIS — J4550 Severe persistent asthma, uncomplicated: Secondary | ICD-10-CM | POA: Diagnosis not present

## 2024-01-30 DIAGNOSIS — L299 Pruritus, unspecified: Secondary | ICD-10-CM

## 2024-01-30 DIAGNOSIS — D721 Eosinophilia, unspecified: Secondary | ICD-10-CM | POA: Diagnosis not present

## 2024-01-30 DIAGNOSIS — J3089 Other allergic rhinitis: Secondary | ICD-10-CM | POA: Diagnosis not present

## 2024-01-30 DIAGNOSIS — J45909 Unspecified asthma, uncomplicated: Secondary | ICD-10-CM | POA: Diagnosis not present

## 2024-01-30 DIAGNOSIS — K219 Gastro-esophageal reflux disease without esophagitis: Secondary | ICD-10-CM | POA: Diagnosis not present

## 2024-01-30 MED ORDER — ALBUTEROL SULFATE HFA 108 (90 BASE) MCG/ACT IN AERS: 2.0000 | INHALATION_SPRAY | RESPIRATORY_TRACT | 1 refills | Status: AC | PRN

## 2024-01-30 MED ORDER — SPACER/AERO-HOLDING CHAMBERS DEVI: 1.0000 | 1 refills | Status: AC

## 2024-01-30 MED ORDER — PANTOPRAZOLE SODIUM 20 MG PO TBEC: 20.0000 mg | DELAYED_RELEASE_TABLET | Freq: Every morning | ORAL | 1 refills | Status: DC

## 2024-01-30 MED ORDER — EPINEPHRINE 0.3 MG/0.3ML IJ SOAJ: 0.3000 mg | INTRAMUSCULAR | 1 refills | Status: AC | PRN

## 2024-01-30 MED ORDER — BUDESONIDE-FORMOTEROL FUMARATE 160-4.5 MCG/ACT IN AERO
2.0000 | INHALATION_SPRAY | Freq: Two times a day (BID) | RESPIRATORY_TRACT | 1 refills | Status: AC
Start: 2024-01-30 — End: ?

## 2024-01-30 NOTE — Patient Instructions (Addendum)
  1.  Continue to perform Allergen avoidance measures - No seafood  2.  Continue to Treat and prevent inflammation:   A.  Symbicort  160 - 2 inhalations twice a day with spacer  3. Continue to treat reflux:   A. Pantoprazole  20- 1 time per day  4.  If needed:   A.  Albuterol  -  2 inhalations every 4-6 hours  B.  Nasal saline  C.  Epi-Pen  5. For itching / hive issue:   A. You may take Cetirizine  (10 mg) 1-2 tablets 1-2 times a day  B. Mometasone  0.1% ointment 1 time per day  C. Blood -  FISH for hypereosinophilia, anti-strongyloides antibody, ANCA w/ reflex, IgA/G/M, IgE, SED rate / CRP  D. Obtain Chest Xray  6. We will call regarding results

## 2024-01-30 NOTE — Progress Notes (Unsigned)
 [place] - [place] - [place] - [place] - [place]   Follow-up Note  Referring Provider: Jarold Medici, MD Primary Provider: Jarold Medici, MD Date of Office Visit: 01/30/2024  Subjective:   Samantha [name] (DOB: 10-07-1940) is a 83 y.o. female who [facility] in re-evaluation of the following:  HPI: Samantha [name] presents in follow up for her chronic cough, LPR, itchiness. She was last seen one week ago by [facility].  History is obtained using online [place] interpreter ipad in lieu of [place] interpreter. One week ago the patient was placed on augmentin  and prednisone  for a sinus infection. She feels better at this time, but still retains a cough that has lingered for many months. Samantha [name] has not traveled outside of the [place]  since 1990. Sometime over the past year, when her brother passed away, she developed the onset of a cough, diarrhea, and fever. She has had two bouts of diarrhea in total and she also has itchiness, particularly of the ankle. Samantha [name] takes Vitamin D , but no other OTC supplement, vitamins, or herbs.   Allergies as of 01/30/2024       Reactions   Ciprofloxacin  Other (See Comments)   Contrast Media [iodinated Contrast Media] Other (See Comments)   Patient breaks out in hives pt has had ct scans in the past with premeds and has not had reaction.   Shellfish Allergy Other (See Comments)   Other Nausea And Vomiting, Rash, Other (See Comments)   Seafood allergy        Medication List        Accurate as of January 30, 2024  9:44 AM. If you have any questions, ask your nurse or doctor.          acetaminophen  500 MG tablet Commonly known as: TYLENOL  Take 500 mg by mouth every 6 (six) hours as needed.   albuterol  108 (90 Base) MCG/ACT inhaler Commonly known as: VENTOLIN  HFA Inhale 2 puffs into the lungs every 4 (four) hours as needed for wheezing or shortness of breath.   amoxicillin -clavulanate 875-125 MG  tablet Commonly known as: AUGMENTIN  Take 1 tablet by mouth 2 (two) times daily.   budesonide -formoterol  160-4.5 MCG/ACT inhaler Commonly known as: Symbicort  Inhale 2 puffs into the lungs 2 (two) times daily.   cetirizine  10 MG tablet Commonly known as: ZYRTEC  Take 2 tablets (20 mg total) by mouth 2 (two) times daily.   EPINEPHrine  0.3 mg/0.3 mL Soaj injection Commonly known as: EPI-PEN Inject 0.3 mg into the muscle as needed for anaphylaxis. Use as directed for severe allergic reaction   lidocaine  5 % Commonly known as: Lidoderm  Place 1 patch onto the skin daily. Remove & Discard patch within 12 hours or as directed by MD. Avoid having more than 1 patch on your skin at any given time.   nystatin  powder Commonly known as: nystatin  Apply topically to right foot twice daily as needed   ondansetron  4 MG disintegrating tablet Commonly known as: ZOFRAN -ODT 4mg  ODT q4 hours prn nausea/vomit   oxyCODONE -acetaminophen  5-325 MG tablet Commonly known as: Percocet Take 1 tablet by mouth every 4 (four) hours as needed.   pantoprazole  20 MG tablet Commonly known as: PROTONIX  Take 1 tablet (20 mg total) by mouth in the morning.   pravastatin  20 MG tablet Commonly known as: PRAVACHOL  Take 20 mg by mouth daily.   predniSONE  10 MG tablet Commonly known as: DELTASONE  Take 1 tablet once a day for 4 days   Spacer/Aero-Holding  Chambers Devi 1 Device by Does not apply route as directed.   triamcinolone  0.025 % ointment Commonly known as: KENALOG  Apply 1 Application topically 2 (two) times daily as needed. Apply to affected areas as needed        Past Medical History:  Diagnosis Date   Asthma    GERD (gastroesophageal reflux disease)    H. pylori infection    Hypercholesteremia    UTI (lower urinary tract infection)     Past Surgical History:  Procedure Laterality Date   ABDOMINAL HYSTERECTOMY     ABDOMINAL SURGERY     CESAREAN SECTION     CHOLECYSTECTOMY N/A 05/21/2018    Procedure: LAPAROSCOPIC CHOLECYSTECTOMY WITH ATTEMPTED INTRAOPERATIVE CHOLANGIOGRAM;  Surgeon: Gladis Cough, MD;  Location: [facility];  Service: General;  Laterality: N/A;    Review of systems negative except as noted in HPI / PMHx or noted below:  Review of Systems  HENT:  Negative for congestion and sore throat.   Eyes:  Negative for discharge and redness.  Respiratory:  Positive for cough. Negative for shortness of breath and wheezing.   Cardiovascular:  Negative for chest pain.  Gastrointestinal:  Positive for diarrhea.  Skin:  Positive for itching.  Neurological:  Negative for dizziness and headaches.  All other systems reviewed and are negative.    Objective:   Vitals:   01/30/24 0917  BP: (!) 118/50  Pulse: 60  Temp: 98.1 F (36.7 C)  SpO2: 97%          Physical Exam Constitutional:      Appearance: Normal appearance.  HENT:     Head: Normocephalic and atraumatic.     Right Ear: Tympanic membrane normal.     Left Ear: Tympanic membrane normal.     Nose: Nose normal. No congestion.     Mouth/Throat:     Mouth: Mucous membranes are moist.  Cardiovascular:     Rate and Rhythm: Normal rate and regular rhythm.  Pulmonary:     Effort: Pulmonary effort is normal.     Breath sounds: Normal breath sounds.  Musculoskeletal:        General: Normal range of motion.     Cervical back: Normal range of motion.  Skin:    General: Skin is warm.     Findings: No rash.     Comments: Hyperpigmented excoriations over the medial ankles  Neurological:     General: No focal deficit present.     Mental Status: She is alert.  Psychiatric:        Mood and Affect: Mood normal.        Behavior: Behavior normal.     Diagnostics: See plan  Assessment and Plan:   No diagnosis found.  Patient Instructions   1.  Continue to perform Allergen avoidance measures - No seafood  2.  Continue to Treat and prevent inflammation:   A.  Symbicort  160 - 2 inhalations twice a day  with spacer  3. Continue to treat reflux:   A. Pantoprazole  20- 1 time per day  4.  If needed:   A.  Albuterol  -  2 inhalations every 4-6 hours  B.  Nasal saline  C.  Epi-Pen  5. For itching / hive issue:   A. You may take Cetirizine  (10 mg) 1-2 tablets 1-2 times a day  B. Mometasone  0.1% ointment 1 time per day  C. Blood -  FISH for hypereosinophilia, anti-strongyloides antibody, ANCA w/ reflex, IgA/G/M, IgE, SED rate / CRP  D. Obtain Chest Xray  6. We will call regarding results  Saveah still has a chronic cough, itchiness of the ankles, and hypereosinophilia for about 10 months (Abs Eos 1.1-1.6k). Otherwise unremarkable CBC and CMP, normal SPEP in September 2024. She has not traveled outside of the [place] , but does recall that her cough started with some diarrhea, and had a subsequent episode of diarrhea since then. She has excoriations to the medial ankles bilaterally. We will test for genetic causes of hypereosinophilia as well as strongyloidiasis and other bloodwork, then obtain CXR. She can increase her cetirizine  and apply mometasone  ointment once daily. We will call with the results.    Samantha [name], MS4 [facility] [employer]  Camellia Denis, MD Allergy / Immunology [facility]

## 2024-01-31 ENCOUNTER — Telehealth: Payer: Self-pay

## 2024-01-31 ENCOUNTER — Encounter: Payer: Self-pay | Admitting: Allergy and Immunology

## 2024-01-31 LAB — STRONGYLOIDES, AB, IGG: Strongyloides, Ab, IgG: POSITIVE — AB

## 2024-01-31 NOTE — Telephone Encounter (Signed)
 Received [facility] results via fax. Checking chart to see if results are already in before discarding fax in shred bin

## 2024-02-05 ENCOUNTER — Other Ambulatory Visit: Payer: Self-pay | Admitting: Internal Medicine

## 2024-02-07 LAB — IGG, IGA, IGM
IgA/Immunoglobulin A, Serum: 277 mg/dL (ref 64–422)
IgG (Immunoglobin G), Serum: 1740 mg/dL — ABNORMAL HIGH (ref 586–1602)
IgM (Immunoglobulin M), Srm: 123 mg/dL (ref 26–217)

## 2024-02-07 LAB — MYELODYSPLASTIC SYNDROME (MDS), FISH PANEL

## 2024-02-07 LAB — ANCA PROFILE
Anti-MPO Antibodies: <0.2 U (ref 0.0–0.9)
Anti-PR3 Antibodies: <0.2 U (ref 0.0–0.9)
Atypical pANCA: <1:20 {titer}
C-ANCA: <1:20 {titer}
P-ANCA: <1:20 {titer}

## 2024-02-12 ENCOUNTER — Ambulatory Visit: Payer: Self-pay | Admitting: Allergy and Immunology

## 2024-02-12 DIAGNOSIS — D721 Eosinophilia, unspecified: Secondary | ICD-10-CM

## 2024-02-15 ENCOUNTER — Telehealth: Payer: Self-pay | Admitting: *Deleted

## 2024-02-15 ENCOUNTER — Other Ambulatory Visit (HOSPITAL_COMMUNITY): Payer: Self-pay

## 2024-02-15 DIAGNOSIS — J45998 Other asthma: Secondary | ICD-10-CM | POA: Diagnosis not present

## 2024-02-15 MED ORDER — NUCALA 100 MG/ML ~~LOC~~ SOAJ
300.0000 mg | SUBCUTANEOUS | 11 refills | Status: AC
  Filled 2024-03-08: qty 3, 28d supply, fill #0

## 2024-02-15 NOTE — Telephone Encounter (Signed)
 Per Dr Maurilio start patient and Nucala  300mg  every 28 days for HE'S.

## 2024-02-15 NOTE — Telephone Encounter (Signed)
 L/m for patient to contact me test claim at [facility] for Nucala 

## 2024-02-20 NOTE — Telephone Encounter (Signed)
 Patient advised approval and submit and will reach out once delivery set to make appt to start therapy in clinic

## 2024-02-21 MED ORDER — IVERMECTIN 3 MG PO TABS: ORAL_TABLET | ORAL | 0 refills | Status: DC

## 2024-02-21 NOTE — Addendum Note (Signed)
 Addended by: CASIMIR CHIQUITA POUR on: 02/21/2024 05:31 PM   Modules accepted: Orders

## 2024-02-22 ENCOUNTER — Other Ambulatory Visit: Payer: Self-pay | Admitting: Allergy and Immunology

## 2024-02-22 DIAGNOSIS — D721 Eosinophilia, unspecified: Secondary | ICD-10-CM

## 2024-02-22 NOTE — Addendum Note (Signed)
 Addended by: CASIMIR CHIQUITA POUR on: 02/22/2024 04:28 PM   Modules accepted: Orders

## 2024-02-23 ENCOUNTER — Other Ambulatory Visit: Payer: Self-pay

## 2024-02-27 ENCOUNTER — Ambulatory Visit (INDEPENDENT_AMBULATORY_CARE_PROVIDER_SITE_OTHER): Payer: 59 | Admitting: Internal Medicine

## 2024-02-27 ENCOUNTER — Encounter: Payer: Self-pay | Admitting: Internal Medicine

## 2024-02-27 VITALS — BP 110/60 | HR 57 | Temp 98.2°F | Ht <= 58 in | Wt 107.0 lb

## 2024-02-27 DIAGNOSIS — E7800 Pure hypercholesterolemia, unspecified: Secondary | ICD-10-CM

## 2024-02-27 DIAGNOSIS — W57XXXA Bitten or stung by nonvenomous insect and other nonvenomous arthropods, initial encounter: Secondary | ICD-10-CM | POA: Diagnosis not present

## 2024-02-27 DIAGNOSIS — Z8619 Personal history of other infectious and parasitic diseases: Secondary | ICD-10-CM | POA: Diagnosis not present

## 2024-02-27 DIAGNOSIS — L299 Pruritus, unspecified: Secondary | ICD-10-CM

## 2024-02-27 DIAGNOSIS — S20461A Insect bite (nonvenomous) of right back wall of thorax, initial encounter: Secondary | ICD-10-CM | POA: Diagnosis not present

## 2024-02-27 DIAGNOSIS — Z79899 Other long term (current) drug therapy: Secondary | ICD-10-CM | POA: Diagnosis not present

## 2024-02-27 MED ORDER — DOXYCYCLINE HYCLATE 100 MG PO TABS: 100.0000 mg | ORAL_TABLET | Freq: Two times a day (BID) | ORAL | 0 refills | Status: AC

## 2024-02-27 NOTE — Assessment & Plan Note (Addendum)
 Recent tick bite with moderate itching, - Prescribed doxycycline  BID, advised sun avoidance. - Sent prescription to [facility] on [address].

## 2024-02-27 NOTE — Assessment & Plan Note (Signed)
 Previously treated with ivermectin , assessing treatment efficacy. - Recheck eosinophil levels.

## 2024-02-27 NOTE — Assessment & Plan Note (Signed)
 Chronic, reports compliance with pravastatin. She is encouraged to follow heart healthy lifestyle. She will continue with current meds.

## 2024-02-27 NOTE — Patient Instructions (Signed)

## 2024-02-27 NOTE — Progress Notes (Signed)
 I,Jameka J Llittleton, CMA,acting as a Neurosurgeon for Catheryn LOISE Slocumb, MD.,have documented all relevant documentation on the behalf of Catheryn LOISE Slocumb, MD,as directed by  Catheryn LOISE Slocumb, MD while in the presence of Catheryn LOISE Slocumb, MD.  Subjective:  Patient ID: Samantha [name] , female    DOB: August 10, 1940 , 83 y.o.   MRN: [id number]  Chief Complaint  Patient presents with   Hyperlipidemia    Patient presents today for cholesterol check. Interpreter is present.    HPI Discussed the use of AI scribe software for clinical note transcription with the patient, who gave verbal consent to proceed.  History of Present Illness Samantha [name] is an 83 year old female who presents for an annual check-up and evaluation of itching and tick bite.  She has ongoing generalized itching that began before a tick bite on February 08, 2024. The itching is intermittent and bothersome. She is under the care of an allergist and is using a lotion and cetirizine  (Zyrtec ), which causes drowsiness. She also uses an ointment prescribed by her allergist.  The tick bite occurred on her back on February 08, 2024, and was removed by her friend on February 10, 2024. The site continues to itch slightly. The tick was large, and there was some bleeding upon removal. She did not seek immediate medical evaluation after the bite.  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 She took four pills of ivermectin  at one time for this treatment.  She is interested in being tested for prediabetes, and there is no family history of diabetes.       Hyperlipidemia This is a chronic problem. The problem is controlled. She has no history of diabetes. There are no known factors aggravating her hyperlipidemia. Pertinent negatives include no chest pain or shortness of breath. Current antihyperlipidemic treatment includes statins.     Past Medical History:  Diagnosis Date   Asthma    GERD  (gastroesophageal reflux disease)    H. pylori infection    Hypercholesteremia    UTI (lower urinary tract infection)      Family History  Problem Relation Age of Onset   Cancer Mother    Asthma Father    Allergic rhinitis Father    Food Allergy Father    Urticaria Neg Hx    Eczema Neg Hx    Angioedema Neg Hx      Current Outpatient Medications:    budesonide -formoterol  (SYMBICORT ) 160-4.5 MCG/ACT inhaler, Inhale 2 puffs into the lungs 2 (two) times daily., Disp: 30.6 g, Rfl: 1   doxycycline  (VIBRA -TABS) 100 MG tablet, Take 1 tablet (100 mg total) by mouth 2 (two) times daily., Disp: 20 tablet, Rfl: 0   EPINEPHrine  0.3 mg/0.3 mL IJ SOAJ injection, Inject 0.3 mg into the muscle as needed for anaphylaxis. Use as directed for severe allergic reaction, Disp: 2 each, Rfl: 1   pantoprazole  (PROTONIX ) 20 MG tablet, Take 1 tablet (20 mg total) by mouth in the morning., Disp: 90 tablet, Rfl: 1   pravastatin  (PRAVACHOL ) 20 MG tablet, TAKE 1 TABLET BY MOUTH DAILY, Disp: 90 tablet, Rfl: 2   acetaminophen  (TYLENOL ) 500 MG tablet, Take 500 mg by mouth every 6 (six) hours as needed. (Patient not taking: Reported on 02/27/2024), Disp: , Rfl:    albuterol  (VENTOLIN  HFA) 108 (90 Base) MCG/ACT inhaler, Inhale 2 puffs into the lungs every 4 (four) hours as needed for wheezing or shortness of breath. (Patient  not taking: Reported on 02/27/2024), Disp: 18 g, Rfl: 1   cetirizine  (ZYRTEC ) 10 MG tablet, Take 2 tablets (20 mg total) by mouth 2 (two) times daily. (Patient not taking: Reported on 02/27/2024), Disp: 180 tablet, Rfl: 1   lidocaine  (LIDODERM ) 5 %, Place 1 patch onto the skin daily. Remove & Discard patch within 12 hours or as directed by MD. Avoid having more than 1 patch on your skin at any given time. (Patient not taking: Reported on 02/27/2024), Disp: 14 patch, Rfl: 0   Mepolizumab  (NUCALA ) 100 MG/ML SOAJ, Inject 3 mLs (300 mg total) into the skin every 28 (twenty-eight) days. (Patient not taking:  Reported on 02/27/2024), Disp: 3 mL, Rfl: 11   nystatin  powder, Apply topically to right foot twice daily as needed (Patient not taking: Reported on 02/27/2024), Disp: 15 g, Rfl: 0   predniSONE  (DELTASONE ) 10 MG tablet, Take 1 tablet once a day for 4 days (Patient not taking: Reported on 02/27/2024), Disp: 4 tablet, Rfl: 0   Spacer/Aero-Holding Chambers DEVI, 1 Device by Does not apply route as directed., Disp: 1 each, Rfl: 1   triamcinolone  (KENALOG ) 0.025 % ointment, Apply 1 Application topically 2 (two) times daily as needed. Apply to affected areas as needed (Patient not taking: Reported on 02/27/2024), Disp: 30 g, Rfl: 1   Allergies  Allergen Reactions   Ciprofloxacin  Other (See Comments)   Contrast Media [Iodinated Contrast Media] Other (See Comments)    Patient breaks out in hives pt has had ct scans in the past with premeds and has not had reaction.   Shellfish Allergy Other (See Comments)   Other Nausea And Vomiting, Rash and Other (See Comments)    Seafood allergy     Review of Systems  Constitutional: Negative.   Respiratory: Negative.  Negative for shortness of breath.   Cardiovascular: Negative.  Negative for chest pain.  Gastrointestinal: Negative.   Neurological: Negative.   Psychiatric/Behavioral: Negative.       Today's Vitals   02/27/24 0834  BP: 110/60  Pulse: (!) 57  Temp: 98.2 F (36.8 C)  TempSrc: Oral  Weight: 107 lb (48.5 kg)  Height: 4' 9.09 (1.45 m)  PainSc: 0-No pain   Body mass index is 23.08 kg/m.  Wt Readings from Last 3 Encounters:  02/27/24 107 lb (48.5 kg)  01/23/24 108 lb 8 oz (49.2 kg)  12/26/23 109 lb 11.2 oz (49.8 kg)    The ASCVD Risk score (Arnett DK, et al., 2019) failed to calculate for the following reasons:   The 2019 ASCVD risk score is only valid for ages 105 to 34  Objective:  Physical Exam Vitals and nursing note reviewed.  Constitutional:      Appearance: Normal appearance.  HENT:     Head: Normocephalic and atraumatic.   Eyes:     Extraocular Movements: Extraocular movements intact.  Cardiovascular:     Rate and Rhythm: Normal rate and regular rhythm.     Heart sounds: Normal heart sounds.  Pulmonary:     Effort: Pulmonary effort is normal.     Breath sounds: Normal breath sounds.  Musculoskeletal:     Cervical back: Normal range of motion.  Skin:    General: Skin is warm.     Comments: Excoriations on upper back  Neurological:     General: No focal deficit present.     Mental Status: She is alert.  Psychiatric:        Mood and Affect: Mood normal.  Behavior: Behavior normal.         Assessment And Plan:  Pure hypercholesterolemia Assessment & Plan: Chronic, reports compliance with pravastatin . She is encouraged to follow heart healthy lifestyle. She will continue with current meds.   Orders: -     Lipid panel  Tick bite of right back wall of thorax, initial encounter Assessment & Plan: Recent tick bite with moderate itching, - Prescribed doxycycline  BID, advised sun avoidance. - Sent prescription to [facility] on [address].   Pruritus -     CBC with Differential/Platelet -     Acute Viral Hepatitis (HAV, HBV, HCV)  Drug therapy -     Hemoglobin A1c  History of infection Assessment & Plan: Previously treated with ivermectin , assessing treatment efficacy. - Recheck eosinophil levels.   Other orders -     Doxycycline  Hyclate; Take 1 tablet (100 mg total) by mouth 2 (two) times daily.  Dispense: 20 tablet; Refill: 0   Return if symptoms worsen or fail to improve.  Patient was given opportunity to ask questions. Patient verbalized understanding of the plan and was able to repeat key elements of the plan. All questions were answered to their satisfaction.    I, Catheryn LOISE Slocumb, MD, have reviewed all documentation for this visit. The documentation on 02/27/24 for the exam, diagnosis, procedures, and orders are all accurate and complete.   IF YOU HAVE BEEN REFERRED TO A  SPECIALIST, IT MAY TAKE 1-2 WEEKS TO SCHEDULE/PROCESS THE REFERRAL. IF YOU HAVE NOT HEARD FROM US /SPECIALIST IN TWO WEEKS, PLEASE GIVE US  A CALL AT [phone].

## 2024-02-28 ENCOUNTER — Ambulatory Visit: Payer: Self-pay | Admitting: Internal Medicine

## 2024-02-28 ENCOUNTER — Other Ambulatory Visit: Payer: Self-pay

## 2024-02-28 LAB — CBC WITH DIFFERENTIAL/PLATELET
Basophils Absolute: 0.1 x10E3/uL (ref 0.0–0.2)
Basos: 1 %
EOS (ABSOLUTE): 0.8 x10E3/uL — ABNORMAL HIGH (ref 0.0–0.4)
Eos: 13 %
Hematocrit: 38.4 % (ref 34.0–46.6)
Hemoglobin: 11.4 g/dL (ref 11.1–15.9)
Immature Grans (Abs): 0 x10E3/uL (ref 0.0–0.1)
Immature Granulocytes: 0 %
Lymphocytes Absolute: 2.3 x10E3/uL (ref 0.7–3.1)
Lymphs: 35 %
MCH: 26.3 pg — ABNORMAL LOW (ref 26.6–33.0)
MCHC: 29.7 g/dL — ABNORMAL LOW (ref 31.5–35.7)
MCV: 89 fL (ref 79–97)
Monocytes Absolute: 0.3 x10E3/uL (ref 0.1–0.9)
Monocytes: 5 %
Neutrophils Absolute: 3.1 x10E3/uL (ref 1.4–7.0)
Neutrophils: 46 %
Platelets: 267 x10E3/uL (ref 150–450)
RBC: 4.33 x10E6/uL (ref 3.77–5.28)
RDW: 13.4 % (ref 11.7–15.4)
WBC: 6.6 x10E3/uL (ref 3.4–10.8)

## 2024-02-28 LAB — LIPID PANEL
Chol/HDL Ratio: 2.8 ratio (ref 0.0–4.4)
Cholesterol, Total: 163 mg/dL (ref 100–199)
HDL: 59 mg/dL (ref >39)
LDL Chol Calc (NIH): 90 mg/dL (ref 0–99)
Triglycerides: 72 mg/dL (ref 0–149)
VLDL Cholesterol Cal: 14 mg/dL (ref 5–40)

## 2024-02-28 LAB — HEMOGLOBIN A1C
Est. average glucose Bld gHb Est-mCnc: 117 mg/dL
Hgb A1c MFr Bld: 5.7 % — ABNORMAL HIGH (ref 4.8–5.6)

## 2024-02-28 LAB — ACUTE VIRAL HEPATITIS (HAV, HBV, HCV)
HCV Ab: NONREACTIVE
Hep A IgM: NEGATIVE
Hep B C IgM: NEGATIVE
Hepatitis B Surface Ag: NEGATIVE

## 2024-02-28 LAB — HCV INTERPRETATION

## 2024-03-01 ENCOUNTER — Other Ambulatory Visit (HOSPITAL_COMMUNITY): Payer: Self-pay

## 2024-03-01 ENCOUNTER — Other Ambulatory Visit: Payer: Self-pay

## 2024-03-08 ENCOUNTER — Other Ambulatory Visit: Payer: Self-pay

## 2024-03-08 ENCOUNTER — Other Ambulatory Visit (HOSPITAL_COMMUNITY): Payer: Self-pay

## 2024-03-08 NOTE — Progress Notes (Signed)
 Patient unable to be finalized. Patient contacted multiple times with interpreter, patient requested we reach out to her son which has been un-successful. Tammy V notified and script is being profiled.

## 2024-03-17 DIAGNOSIS — J45998 Other asthma: Secondary | ICD-10-CM | POA: Diagnosis not present

## 2024-03-22 ENCOUNTER — Ambulatory Visit (INDEPENDENT_AMBULATORY_CARE_PROVIDER_SITE_OTHER)

## 2024-03-22 VITALS — BP 110/60 | HR 55 | Temp 98.1°F | Ht <= 58 in | Wt 107.0 lb

## 2024-03-22 DIAGNOSIS — Z23 Encounter for immunization: Secondary | ICD-10-CM | POA: Diagnosis not present

## 2024-03-22 NOTE — Progress Notes (Signed)
 Patient is in office today for a nurse visit for FLU Immunization. Patient Injection was given in the  Left deltoid. Patient tolerated injection well.

## 2024-03-22 NOTE — Patient Instructions (Signed)
Influenza (Flu) Vaccine (Inactivated or Recombinant): What You Need to Know Many vaccine information statements are available in Spanish and other languages. See PromoAge.com.br. 1. Why get vaccinated? Influenza vaccine can prevent influenza (flu). Flu is a contagious disease that spreads around the [place] every year, usually between October and May. Anyone can get the flu, but it is more dangerous for some people. Infants and young children, people 58 years and older, pregnant people, and people with certain health conditions or a weakened immune system are at greatest risk of flu complications. Pneumonia, bronchitis, sinus infections, and ear infections are examples of flu-related complications. If you have a medical condition, such as heart disease, cancer, or diabetes, flu can make it worse. Flu can cause fever and chills, sore throat, muscle aches, fatigue, cough, headache, and runny or stuffy nose. Some people may have vomiting and diarrhea, though this is more common in children than adults. In an average year, thousands of people in the [place] die from flu, and many more are hospitalized. Flu vaccine prevents millions of illnesses and flu-related visits to the doctor each year. 2. Influenza vaccines CDC recommends everyone 6 months and older get vaccinated every flu season. Children 6 months through 51 years of age may need 2 doses during a single flu season. Everyone else needs only 1 dose each flu season. It takes about 2 weeks for protection to develop after vaccination. There are many flu viruses, and they are always changing. Each year a new flu vaccine is made to protect against the influenza viruses believed to be likely to cause disease in the upcoming flu season. Even when the vaccine doesn't exactly match these viruses, it may still provide some protection. Influenza vaccine does not cause flu. Influenza vaccine may be given at the same time as other vaccines. 3.  Talk with your health care provider Tell your vaccination provider if the person getting the vaccine: Has had an allergic reaction after a previous dose of influenza vaccine, or has any severe, life-threatening allergies Has ever had Guillain-Barr Syndrome (also called "GBS") In some cases, your health care provider may decide to postpone influenza vaccination until a future visit. Influenza vaccine can be administered at any time during pregnancy. People who are or will be pregnant during influenza season should receive inactivated influenza vaccine. People with minor illnesses, such as a cold, may be vaccinated. People who are moderately or severely ill should usually wait until they recover before getting influenza vaccine. Your health care provider can give you more information. 4. Risks of a vaccine reaction Soreness, redness, and swelling where the shot is given, fever, muscle aches, and headache can happen after influenza vaccination. There may be a very small increased risk of Guillain-Barr Syndrome (GBS) after inactivated influenza vaccine (the flu shot). Young children who get the flu shot along with pneumococcal vaccine (PCV13) and/or DTaP vaccine at the same time might be slightly more likely to have a seizure caused by fever. Tell your health care provider if a child who is getting flu vaccine has ever had a seizure. People sometimes faint after medical procedures, including vaccination. Tell your provider if you feel dizzy or have vision changes or ringing in the ears. As with any medicine, there is a very remote chance of a vaccine causing a severe allergic reaction, other serious injury, or death. 5. What if there is a serious problem? An allergic reaction could occur after the vaccinated person leaves the clinic. If you see signs of  a severe allergic reaction (hives, swelling of the face and throat, difficulty breathing, a fast heartbeat, dizziness, or weakness), call 9-1-1 and get  the person to the nearest hospital. For other signs that concern you, call your health care provider. Adverse reactions should be reported to the Vaccine Adverse Event Reporting System (VAERS). Your health care provider will usually file this report, or you can do it yourself. Visit the VAERS website at www.vaers.LAgents.no or call (774)365-3656. VAERS is only for reporting reactions, and VAERS staff members do not give medical advice. 6. The National Vaccine Injury Compensation Program The [employer] Vaccine Injury Compensation Program (VICP) is a federal program that was created to compensate people who may have been injured by certain vaccines. Claims regarding alleged injury or death due to vaccination have a time limit for filing, which may be as short as two years. Visit the VICP website at SpiritualWord.at or call 3187036210 to learn about the program and about filing a claim. 7. How can I learn more? Ask your health care provider. Call your local or state health department. Visit the website of the Food and Drug Administration (FDA) for vaccine package inserts and additional information at FinderList.no. Contact the Centers for Disease Control and Prevention (CDC): Call 787-694-5930 (1-800-CDC-INFO) or Visit CDC's website at BiotechRoom.com.cy. Source: CDC Vaccine Information Statement Inactivated Influenza Vaccine (02/07/2020) This same material is available at FootballExhibition.com.br for no charge. This information is not intended to replace advice given to you by your health care provider. Make sure you discuss any questions you have with your health care provider. Document Revised: 10/05/2022 Document Reviewed: 07/11/2022 Elsevier Patient Education  2024 [employer].

## 2024-04-02 ENCOUNTER — Ambulatory Visit

## 2024-04-16 DIAGNOSIS — J45998 Other asthma: Secondary | ICD-10-CM | POA: Diagnosis not present

## 2024-06-13 NOTE — Congregational Nurse Program (Signed)
 CN office visit with interpreter Diu Hartshorn assisting.  Patient stated she does not have Medicaid and would like to apply  We assisted her in completion of application and she will either mail or drop it off. Elveria Rummer RN, Congregational Nurse [phone]

## 2024-07-01 ENCOUNTER — Other Ambulatory Visit: Payer: Self-pay | Admitting: Gastroenterology

## 2024-07-01 DIAGNOSIS — R1033 Periumbilical pain: Secondary | ICD-10-CM

## 2024-07-15 ENCOUNTER — Other Ambulatory Visit: Payer: Self-pay | Admitting: Internal Medicine

## 2024-07-15 ENCOUNTER — Other Ambulatory Visit: Payer: Self-pay | Admitting: *Deleted

## 2024-07-15 MED ORDER — PANTOPRAZOLE SODIUM 20 MG PO TBEC: 20.0000 mg | DELAYED_RELEASE_TABLET | Freq: Every morning | ORAL | 1 refills | Status: DC

## 2024-07-17 ENCOUNTER — Other Ambulatory Visit: Payer: Self-pay

## 2024-07-17 MED ORDER — PANTOPRAZOLE SODIUM 20 MG PO TBEC: 20.0000 mg | DELAYED_RELEASE_TABLET | Freq: Every morning | ORAL | 1 refills | Status: AC

## 2024-07-30 ENCOUNTER — Ambulatory Visit: Admitting: Family

## 2024-08-06 ENCOUNTER — Ambulatory Visit: Admitting: Family

## 2024-08-21 ENCOUNTER — Ambulatory Visit: Payer: 59

## 2024-09-03 ENCOUNTER — Encounter: Payer: Self-pay | Admitting: Internal Medicine
